# Patient Record
Sex: Female | Born: 1945 | Race: White | Hispanic: No | Marital: Married | Smoking: Never smoker
Health system: Southern US, Community
[De-identification: ages and names within clinical notes are randomized; demographics above are authoritative.]

## PROBLEM LIST (undated history)

## (undated) DIAGNOSIS — L570 Actinic keratosis: Secondary | ICD-10-CM

## (undated) DIAGNOSIS — M8580 Other specified disorders of bone density and structure, unspecified site: Secondary | ICD-10-CM

## (undated) DIAGNOSIS — N6009 Solitary cyst of unspecified breast: Secondary | ICD-10-CM

## (undated) DIAGNOSIS — I4891 Unspecified atrial fibrillation: Secondary | ICD-10-CM

## (undated) DIAGNOSIS — C801 Malignant (primary) neoplasm, unspecified: Secondary | ICD-10-CM

## (undated) DIAGNOSIS — C679 Malignant neoplasm of bladder, unspecified: Secondary | ICD-10-CM

## (undated) DIAGNOSIS — I340 Nonrheumatic mitral (valve) insufficiency: Secondary | ICD-10-CM

## (undated) DIAGNOSIS — I10 Essential (primary) hypertension: Secondary | ICD-10-CM

## (undated) DIAGNOSIS — D72819 Decreased white blood cell count, unspecified: Secondary | ICD-10-CM

## (undated) HISTORY — DX: Nonrheumatic mitral (valve) insufficiency: I34.0

## (undated) HISTORY — PX: JOINT REPLACEMENT: SHX530

## (undated) HISTORY — DX: Other specified disorders of bone density and structure, unspecified site: M85.80

## (undated) HISTORY — PX: MASTECTOMY: SHX3

## (undated) HISTORY — DX: Decreased white blood cell count, unspecified: D72.819

## (undated) HISTORY — PX: TOTAL HIP ARTHROPLASTY: SHX124

## (undated) HISTORY — DX: Malignant neoplasm of bladder, unspecified: C67.9

## (undated) HISTORY — DX: Actinic keratosis: L57.0

## (undated) HISTORY — DX: Solitary cyst of unspecified breast: N60.09

## (undated) HISTORY — DX: Malignant (primary) neoplasm, unspecified: C80.1

## (undated) HISTORY — DX: Unspecified atrial fibrillation: I48.91

## (undated) HISTORY — PX: SHOULDER SURGERY: SHX246

---
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
## 1995-03-23 HISTORY — PX: KNEE ARTHROSCOPY W/ MENISCECTOMY: SHX1879

## 2001-03-22 DIAGNOSIS — N6009 Solitary cyst of unspecified breast: Secondary | ICD-10-CM

## 2001-03-22 HISTORY — DX: Solitary cyst of unspecified breast: N60.09

## 2006-05-02 ENCOUNTER — Other Ambulatory Visit: Payer: Self-pay

## 2006-05-02 ENCOUNTER — Inpatient Hospital Stay: Payer: Self-pay | Admitting: Cardiology

## 2006-05-03 ENCOUNTER — Other Ambulatory Visit: Payer: Self-pay

## 2008-03-22 DIAGNOSIS — C801 Malignant (primary) neoplasm, unspecified: Secondary | ICD-10-CM

## 2008-03-22 DIAGNOSIS — I4891 Unspecified atrial fibrillation: Secondary | ICD-10-CM

## 2008-03-22 HISTORY — DX: Malignant (primary) neoplasm, unspecified: C80.1

## 2008-03-22 HISTORY — DX: Unspecified atrial fibrillation: I48.91

## 2008-10-27 LAB — HM MAMMOGRAPHY

## 2009-02-19 HISTORY — PX: MASTECTOMY: SHX3

## 2009-02-22 ENCOUNTER — Emergency Department: Payer: Self-pay | Admitting: Internal Medicine

## 2010-08-17 ENCOUNTER — Ambulatory Visit: Payer: Self-pay | Admitting: Internal Medicine

## 2010-11-09 ENCOUNTER — Encounter: Payer: Self-pay | Admitting: Internal Medicine

## 2010-11-09 ENCOUNTER — Ambulatory Visit (INDEPENDENT_AMBULATORY_CARE_PROVIDER_SITE_OTHER): Payer: BC Managed Care – PPO | Admitting: Internal Medicine

## 2010-11-09 VITALS — BP 126/82 | HR 74 | Temp 99.2°F | Resp 14 | Ht 66.5 in | Wt 195.8 lb

## 2010-11-09 DIAGNOSIS — H6690 Otitis media, unspecified, unspecified ear: Secondary | ICD-10-CM

## 2010-11-09 MED ORDER — AZITHROMYCIN 500 MG PO TABS: 500.0000 mg | ORAL_TABLET | Freq: Every day | ORAL | Status: DC

## 2010-11-09 MED ORDER — AZITHROMYCIN 500 MG PO TABS: ORAL_TABLET | ORAL | Status: AC

## 2010-11-09 MED ORDER — AZITHROMYCIN 500 MG PO TABS: ORAL_TABLET | ORAL | Status: DC

## 2010-11-09 NOTE — Progress Notes (Signed)
  Subjective:     Heidi Walsh is a 65 y.o. female who presents with ear pain and possible ear infection. Symptoms include: left ear pain. Onset of symptoms was 4 days ago, and have been unchanged since that time. Associated symptoms include: achiness, headache and low grade fever.  Patient denies: chills, productive cough and sinus pressure. She is drinking plenty of fluids.  The following portions of the patient's history were reviewed and updated as appropriate: allergies, current medications, past family history, past medical history, past social history, past surgical history and problem list.  Review of Systems A comprehensive review of systems was negative except for: Ears, nose, mouth, throat, and face: positive for earaches   Objective:    BP 126/82  Pulse 74  Temp(Src) 99.2 F (37.3 C) (Oral)  Resp 14  Ht 5' 6.5" (1.689 m)  Wt 195 lb 12 oz (88.792 kg)  BMI 31.12 kg/m2 General:  alert, cooperative, appears stated age and no distress  Right Ear: normal landmarks and mobility  Left Ear: inflamed  Mouth:  normal findings: lips normal without lesions  Neck: no adenopathy, no carotid bruit, no JVD, supple, symmetrical, trachea midline and thyroid not enlarged, symmetric, no tenderness/mass/nodules     Assessment:    Left acute otitis media   Plan:    Treatment: Azithromycin. OTC analgesia as needed. Fluids, rest, avoid carbonated/alcoholic and caffeinated beverages.  Follow up in 7 days if not improving.

## 2010-11-09 NOTE — Patient Instructions (Addendum)
Take Sudafed PE 10 mg every 6 hours for ear congestion  No underwater pool activity for one week,  Use a cotton ball in ear for your water exercisesMiddle Ear Infection, Adult (Otitis Media, Adult) A middle ear infection is an infection in the space behind the eardrum. The medical name for this is "otitis media." It may happen after a common cold. It is caused by a germ that starts growing in that space. You may feel swollen glands in your neck on the side of the ear infection. HOME CARE INSTRUCTIONS  Take your medicine as directed until it is gone, even if you feel better after the first few days.   Only take over-the-counter or prescription medicines for pain, discomfort, or fever as directed by your caregiver.   Occasional use of a nasal decongestant a couple times per day may help with discomfort and help the eustachian tube to drain better.  Follow up with your caregiver in 10 to 14 days or as directed, to be certain that the infection has cleared. Not keeping the appointment could result in a chronic or permanent injury, pain, hearing loss and disability. If there is any problem keeping the appointment, you must call back to this facility for assistance. SEEK IMMEDIATE MEDICAL CARE IF:  You are not getting better in 2 to 3 days.   You have pain that is not controlled with medication.   You feel worse instead of better.   You cannot use the medication as directed.   You develop swelling, redness or pain around the ear or stiffness in your neck.  MAKE SURE YOU:  Understand these instructions.   Will watch your condition.   Will get help right away if you are not doing well or get worse.  Document Released: 12/12/2003 Document Re-Released: 08/26/2009 [facility] Patient Information 2011 [place], [place].

## 2010-11-19 ENCOUNTER — Ambulatory Visit (INDEPENDENT_AMBULATORY_CARE_PROVIDER_SITE_OTHER): Payer: BC Managed Care – PPO | Admitting: Internal Medicine

## 2010-11-19 ENCOUNTER — Encounter: Payer: Self-pay | Admitting: Internal Medicine

## 2010-11-19 VITALS — BP 141/100 | HR 79 | Temp 97.6°F | Resp 16 | Ht 66.5 in | Wt 194.5 lb

## 2010-11-19 DIAGNOSIS — H6690 Otitis media, unspecified, unspecified ear: Secondary | ICD-10-CM

## 2010-11-19 MED ORDER — AZITHROMYCIN 500 MG PO TABS: ORAL_TABLET | ORAL | Status: AC

## 2010-11-19 NOTE — Assessment & Plan Note (Signed)
Symptoms havve improved but have not resolved.  Will resume sudafed PE and augmentin for one more week, and advised to stay out of water until resolved.  She has requested molds made for future use during water aerobics.

## 2010-11-19 NOTE — Progress Notes (Signed)
  Subjective:    Patient ID: Heidi Walsh, female    DOB: 1945/10/01, 65 y.o.   MRN: [id number]  HPI [name] is here to followup on otitis media treated last Monday.  She did have some improvement on Azithromycin but continues to have a  feeling of fluid in left ear along with periaurichular discomfort without drainage or fevers.  Has resumed flonase only recently but stopped sudafed PE.      Review of Systems  Constitutional: Negative for fever, chills and unexpected weight change.  HENT: Positive for ear pain. Negative for hearing loss, nosebleeds, congestion, sore throat, facial swelling, rhinorrhea, sneezing, mouth sores, trouble swallowing, neck pain, neck stiffness, voice change, postnasal drip, sinus pressure, tinnitus and ear discharge.   Eyes: Negative for pain, discharge, redness and visual disturbance.  Respiratory: Negative for cough, chest tightness, shortness of breath, wheezing and stridor.   Cardiovascular: Negative for chest pain, palpitations and leg swelling.  Musculoskeletal: Negative for myalgias and arthralgias.  Skin: Negative for color change and rash.  Neurological: Negative for dizziness, weakness, light-headedness and headaches.  Hematological: Negative for adenopathy.       Objective:   Physical Exam  Constitutional: She is oriented to person, place, and time. She appears well-developed and well-nourished.  HENT:  Right Ear: Tympanic membrane is injected.  Left Ear: Hearing normal. There is mastoid tenderness. Tympanic membrane is injected. A middle ear effusion is present.  Mouth/Throat: Oropharynx is clear and moist.  Eyes: EOM are normal. Pupils are equal, round, and reactive to light. No scleral icterus.  Neck: Normal range of motion. Neck supple. No JVD present. No thyromegaly present.  Cardiovascular: Normal rate, regular rhythm, normal heart sounds and intact distal pulses.   Pulmonary/Chest: Effort normal and breath sounds normal.  Abdominal: Soft.  Bowel sounds are normal. She exhibits no mass. There is no tenderness.  Musculoskeletal: Normal range of motion. She exhibits no edema.  Lymphadenopathy:    She has no cervical adenopathy.  Neurological: She is alert and oriented to person, place, and time.  Skin: Skin is warm and dry.  Psychiatric: She has a normal mood and affect.          Assessment & Plan:

## 2010-11-19 NOTE — Patient Instructions (Signed)
Resume azithromycin and sudafed PE for one more week.

## 2010-11-27 ENCOUNTER — Telehealth: Payer: Self-pay | Admitting: *Deleted

## 2010-11-27 DIAGNOSIS — H6690 Otitis media, unspecified, unspecified ear: Secondary | ICD-10-CM

## 2010-11-27 MED ORDER — LEVOFLOXACIN 500 MG PO TABS: 500.0000 mg | ORAL_TABLET | Freq: Every day | ORAL | Status: AC

## 2010-11-27 NOTE — Telephone Encounter (Signed)
rx for levaquin sent to pharmacy,  One tablet daily for 7 days

## 2010-11-27 NOTE — Telephone Encounter (Signed)
Pt finished zithromax today and is still having bilateral ear pain, feeling fatigued, no known fever..  Pt states she was told to call if no better for antibiotics.  Pt uses [facility].  Please advise.

## 2010-11-27 NOTE — Telephone Encounter (Signed)
RC to pt.  Pt states she is already taking Flonase and Sudafed PE.  She last saw Dr. Jenne Campus last fall, but she will call his office today for an appointment.  Pt would like RX for antibiotic in interim.  Pt was under impression a different antibiotic would be next step. Please advise.

## 2010-11-27 NOTE — Telephone Encounter (Signed)
Is she using any decongestants?  If not,  Add sudafed PE available OTC.  And I will need to refer to ENT for persistent ear pain

## 2010-12-14 ENCOUNTER — Other Ambulatory Visit: Payer: Self-pay | Admitting: Internal Medicine

## 2010-12-14 MED ORDER — BUPROPION HCL ER (SR) 200 MG PO TB12: 200.0000 mg | ORAL_TABLET | Freq: Two times a day (BID) | ORAL | Status: DC

## 2011-02-20 HISTORY — PX: ATRIAL ABLATION SURGERY: SHX560

## 2011-02-23 ENCOUNTER — Ambulatory Visit (INDEPENDENT_AMBULATORY_CARE_PROVIDER_SITE_OTHER): Payer: BC Managed Care – PPO | Admitting: Internal Medicine

## 2011-02-23 DIAGNOSIS — Z23 Encounter for immunization: Secondary | ICD-10-CM

## 2011-03-11 ENCOUNTER — Ambulatory Visit: Payer: Self-pay | Admitting: Cardiology

## 2011-06-23 DIAGNOSIS — M722 Plantar fascial fibromatosis: Secondary | ICD-10-CM | POA: Insufficient documentation

## 2011-10-18 DIAGNOSIS — G709 Myoneural disorder, unspecified: Secondary | ICD-10-CM | POA: Insufficient documentation

## 2011-11-17 ENCOUNTER — Encounter: Payer: Self-pay | Admitting: Internal Medicine

## 2011-11-17 ENCOUNTER — Ambulatory Visit (INDEPENDENT_AMBULATORY_CARE_PROVIDER_SITE_OTHER): Payer: BC Managed Care – PPO | Admitting: Internal Medicine

## 2011-11-17 VITALS — BP 112/70 | HR 76 | Temp 98.9°F | Resp 16 | Wt 204.5 lb

## 2011-11-17 DIAGNOSIS — G4730 Sleep apnea, unspecified: Secondary | ICD-10-CM

## 2011-11-17 DIAGNOSIS — C7989 Secondary malignant neoplasm of other specified sites: Secondary | ICD-10-CM | POA: Insufficient documentation

## 2011-11-17 DIAGNOSIS — E669 Obesity, unspecified: Secondary | ICD-10-CM

## 2011-11-17 DIAGNOSIS — M25569 Pain in unspecified knee: Secondary | ICD-10-CM

## 2011-11-17 DIAGNOSIS — I4891 Unspecified atrial fibrillation: Secondary | ICD-10-CM

## 2011-11-17 DIAGNOSIS — I480 Paroxysmal atrial fibrillation: Secondary | ICD-10-CM | POA: Insufficient documentation

## 2011-11-17 DIAGNOSIS — Z96652 Presence of left artificial knee joint: Secondary | ICD-10-CM | POA: Insufficient documentation

## 2011-11-17 DIAGNOSIS — Z853 Personal history of malignant neoplasm of breast: Secondary | ICD-10-CM

## 2011-11-17 NOTE — Patient Instructions (Addendum)
This is  Dr. Tullos's version of a  "Low GI"  Diet:  All of the foods can be found at grocery stores and in bulk at [employer].  The Atkins protein bars and shakes are available in more varieties at [employer], [employer] and [employer].     7 AM Breakfast:  Low carbohydrate Protein  Shakes (I recommend the EAS AdvantEdge "Carb Control" shakes  Or the low carb shakes by Atkins.   Both are available everywhere:  In  cases at [employer]Js  Or in 4 packs at grocery stores and pharmacies  2.5 carbs  (Alternative is  a toasted Arnold's Sandwhich Thin w/ peanut butter, a "Bagel Thin" with cream cheese and salmon) or  a scrambled egg burrito made with a low carb tortilla .  Avoid cereal and bananas, oatmeal too unless you are cooking the old fashioned kind that takes 30-40 minutes to prepare.  the rest is overly processed, has minimal fiber, and is loaded with carbohydrates!   10 AM: Protein bar by Atkins (the snack size, under 200 cal).  There are many varieties , available widely again or in bulk in limited varieties at [employer]Js)  Other so called "protein bars" tend to be loaded with carbohydrates.  Remember, in food advertising, the word "energy" is synonymous for " carbohydrate."  Lunch: sandwich of turkey, (or any lunchmeat, grilled meat or canned tuna), fresh avocado, mayonnaise  and cheese on a lower carbohydrate pita bread, flatbread, or tortilla . Ok to use regular mayonnaise. The bread is the only source or carbohydrate that can be decreased (Joseph's makes a pita bread and a flat bread that are 50 cal and 4 net carbs ; Toufayan makes a low carb flatbread that's 100 cal and 9 net carbs  and  Mission makes a low carb whole wheat tortilla  That is 210 cal and 6 net carbs)  3 PM:  Mid day :  Another protein bar,  Or a  cheese stick (100 cal, 0 carbs),  Or 1 ounce of  almonds, walnuts, pistachios, pecans, peanuts,  Macadamia nuts. Or a Dannon light n Fit greek yogurt, 80 cal 8 net carbs . Avoid "granola"; the dried cranberries  and raisins are loaded with carbohydrates. Mixed nuts ok if no raisins or cranberries or dried fruit.      6 PM  Dinner:  "mean and green:"  Meat/chicken/fish or a high protein legume; , with a green salad, and a low GI  Veggie (broccoli, cauliflower, green beans, spinach, brussel sprouts. Lima beans) : Avoid "Low fat dressings, as well as [place] and [place]! They are loaded with sugar! Instead use ranch, vinagrette,  Blue cheese, etc  9 PM snack : Breyer's "low carb" fudgsicle or  ice cream bar (Carb Smart line), or  Weight Watcher's ice cream bar , or another "no sugar added" ice cream;a serving of fresh berries/cherries with whipped cream (Avoid bananas, pineapple, grapes  and watermelon on a regular basis because they are high in sugar)   Remember that snack Substitutions should be less than 15 to 20 carbs  Per serving. Remember to subtract fiber grams and sugar alcohols to get the "net carbs."    

## 2011-11-17 NOTE — Progress Notes (Signed)
Patient ID: Heidi Walsh, female   DOB: 04-07-1945, 66 y.o.   MRN: [id number]  Patient Active Problem List  Diagnosis  . Obesity (BMI 30-39.9)  . Knee pain  . Sleep apnea  . Atrial fibrillation  . History of breast cancer  . breast cancer    Subjective:  CC:   Chief Complaint  Patient presents with  . Follow-up    HPI: Multiple complaints including weight gain and joint pain  Patient has been lost to follow up over the past year she had a recurrence of chest discomfort and chest tightness and saw her oncologist.  Workup was negative for recurrence of CA. She was referred to Dr.  Lady Gary who ordered overnight pulse ox   which strongly suggested sleep apnea with desaturations into the 70s. This was confirmed with a sleep study in late December showing severe apnea.   AHI was 68,  Sats dropped to low 70s .   she Has been wearing CPAP every night since Jan 8th.  she has had a 10 lb wt gain after 9 weeks of using CPAP despite exercising regularly at [employer] and swimming several times per week. She has had her CPAP recheck and is on the appropriate pressure.  additionally she had a brief admission for uncontrolled atrial fib/flutter became problematic.  Cardioversion was scheduled but deferred and her arrhythmia is currently managed with  high doses of flecainide.   she was eventually referred for ablation and Antony Madura at [facility] did a comprehensive ablation on July 28.   however she remains dependent on flecainide 150 mg bid, metoprolol 50  Mg .  she wasTold to modify her exercise For 3 months while  herheart was  remapping /recovering.   she has a Follow up today with [facility].   2)  her right knee continues to be a source of constant pain. She underwent a mask on him he in  1997,  but recently had a second opinion by an orthopedist at [facility] .   he opined that her leg pain was due to severe  bilateral valgus and leg length discrepancy and recommended straightening of her right leg and  leg lengthening along with knee replacements, right followed by left.  She has done some research and is requesting a referral to Dr. Piedad Climes at [facility].  she has brought copies of her sleep study as well as clinic notes from her evaluation by Shawn Stall PA-C at the [facility] department of orthopedics and sports medicine.    Heidi Sethi Robertsis a 66 y.o. female who presents   Past Medical History  Diagnosis Date  . Atrial fibrillation 2010    Followed by Dr. Mariel Kansky  . Mitral regurgitation     and prolapse  . Actinic keratosis     managed by Jimmy Footman  . Osteopenia 56    Stable by repeat scans  . Breast cyst 2003    aspirated, fibroystics breasts with stable calcifications on left breast  . Leukopenia   . breast cancer 2010    s/p mastectomy    Past Surgical History  Procedure Date  . Mastectomy 02/2009    Bilateral  . Cesarean section     x 2  . Shoulder surgery     left rotator cuff  . Knee arthroscopy w/ meniscectomy 1997  . Atrial ablation surgery Dec 2012    [facility]         The following portions  of the patient's history were reviewed and updated as appropriate: Allergies, current medications, and problem list.    Review of Systems:  A comprehensive ROS was done and positive for knee pain and weight gain.   The rest was negative.  History   Social History  . Marital Status: Married    Spouse Name: N/A    Number of Children: N/A  . Years of Education: N/A   Occupational History  . Not on file.   Social History Main Topics  . Smoking status: Never Smoker   . Smokeless tobacco: Never Used  . Alcohol Use: Yes     Moderate  . Drug Use: No  . Sexually Active: Not on file   Other Topics Concern  . Not on file   Social History Narrative  . No narrative on file    Objective:  BP 112/70  Pulse 76  Temp 98.9 F (37.2 C) (Oral)  Resp 16  Wt 204 lb 8 oz (92.761 kg)  SpO2 96%  General appearance: alert, obese,  cooperative and appears stated age Ears: normal TM's and external ear canals both ears Throat: lips, mucosa, and tongue normal; teeth and gums normal Neck: no adenopathy, no carotid bruit, supple, symmetrical, trachea midline and thyroid not enlarged, symmetric, no tenderness/mass/nodules Back: symmetric, no curvature. ROM normal. No CVA tenderness. Lungs: clear to auscultation bilaterally Heart: regular rate and rhythm, S1, S2 normal, no murmur, click, rub or gallop Abdomen: soft, non-tender; bowel sounds normal; no masses,  no organomegaly Pulses: 2+ and symmetric Skin: Skin color, texture, turgor normal. No rashes or lesions MSK:  Severe valgus noted.  No effusions.  Lymph nodes: Cervical, supraclavicular, and axillary nodes normal.  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 She underwent a steroid injection at the [facility] in July and is requesting referral to local orthopedist for discussion of multiple interventions including leg length modification and straightening followed by replacement.  Obesity (BMI 30-39.9) She is frustrated by her weight gain considering her consistency with exercising. She is limited somewhat by her knee pain. I've recommended a low glycemic index diet and have given her an example in printed form.  Sleep apnea Diagnosed in December with a sleep study. Patient had low O2 saturations of 73%. This is most likely aggravating her atrial fibrillation. She is wearing CPAP regularly. OSA was considered to with the total AHI of 38 events per hour with 66 events per hour recorded during supine sleep. She is wearing CPAP at 6 cm H20  Atrial fibrillation Status post ablation by AP physiologist at [facility]. She continues to use flecainide and metoprolol as directed. She will follow up with Dr. Ihor Austin today.   Updated Medication List Outpatient  Encounter Prescriptions as of 11/17/2011  Medication Sig Dispense Refill  . apixaban (ELIQUIS) 5 MG TABS tablet Take by mouth 2 (two) times daily.      Marland Kitchen b complex vitamins capsule Take 1 capsule by mouth daily.        . Calcium Carbonate-Vitamin D (CALCIUM-VITAMIN D) 500-200 MG-UNIT per tablet Take 1 tablet by mouth 2 (two) times daily with a meal.        . cetirizine (ZYRTEC) 10 MG tablet Take 10 mg by mouth daily.        . diphenhydramine-acetaminophen (TYLENOL PM) 25-500 MG TABS Take 1 tablet by mouth at bedtime as needed.      Marland Kitchen  Doxylamine Succinate, Sleep, (SLEEP AID PO) Take by mouth.      . flecainide (TAMBOCOR) 150 MG tablet Take 150 mg by mouth 2 (two) times daily.      . hydrochlorothiazide 25 MG tablet Take 25 mg by mouth daily.        Marland Kitchen HYDROcodone-acetaminophen (VICODIN) 5-500 MG per tablet Take 1 tablet by mouth every 6 (six) hours as needed.      Marland Kitchen letrozole (FEMARA) 2.5 MG tablet Take 2.5 mg by mouth daily.        . metoprolol (TOPROL-XL) 50 MG 24 hr tablet Take 50 mg by mouth daily.        . Multiple Vitamin (MULTIVITAMIN) tablet Take 1 tablet by mouth daily.        . Omega 3-6-9 Fatty Acids (OMEGA 3-6-9 COMPLEX PO) Take by mouth.      . DISCONTD: aspirin 325 MG tablet Take 325 mg by mouth daily.        Marland Kitchen DISCONTD: buPROPion (WELLBUTRIN SR) 200 MG 12 hr tablet Take 1 tablet (200 mg total) by mouth 2 (two) times daily.  60 tablet  6  . DISCONTD: GINKGO BILOBA PO Take 1 tablet by mouth daily.           Orders Placed This Encounter  Procedures  . Ambulatory referral to Orthopedic Surgery    No Follow-up on file.

## 2011-11-18 ENCOUNTER — Encounter: Payer: Self-pay | Admitting: Internal Medicine

## 2011-11-18 DIAGNOSIS — C801 Malignant (primary) neoplasm, unspecified: Secondary | ICD-10-CM | POA: Insufficient documentation

## 2011-11-18 NOTE — Assessment & Plan Note (Signed)
She is frustrated by her weight gain considering her consistency with exercising. She is limited somewhat by her knee pain. I've recommended a low glycemic index diet and have given her an example in printed form.

## 2011-11-18 NOTE — Assessment & Plan Note (Signed)
Status post ablation by AP physiologist at [facility]. She continues to use flecainide and metoprolol as directed. She will follow up with Dr. Ihor Austin today.

## 2011-11-18 NOTE — Assessment & Plan Note (Signed)
Diagnosed in December with a sleep study. Patient had low O2 saturations of 73%. This is most likely aggravating her atrial fibrillation. She is wearing CPAP regularly. OSA was considered to with the total AHI of 38 events per hour with 66 events per hour recorded during supine sleep. She is wearing CPAP at 6 cm H20

## 2011-11-18 NOTE — Assessment & Plan Note (Signed)
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 She underwent a steroid injection at the [facility] in July and is requesting referral to local orthopedist for discussion of multiple interventions including leg length modification and straightening followed by replacement.

## 2011-12-30 ENCOUNTER — Telehealth: Payer: Self-pay | Admitting: Internal Medicine
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
## 2011-12-30 NOTE — Telephone Encounter (Signed)
Spoke with patient via telephone, she stated that she didn't have any aches and pains until today, advised patient that she should have went to ED or urgent care last night.  We have no available appts here in the office tomorrow.  She stated that she will go to urgent care.

## 2011-12-31 ENCOUNTER — Ambulatory Visit: Payer: Self-pay | Admitting: Medical

## 2012-05-06 ENCOUNTER — Other Ambulatory Visit: Payer: Self-pay

## 2012-05-24 ENCOUNTER — Ambulatory Visit: Payer: Self-pay | Admitting: Family Medicine

## 2012-05-24 ENCOUNTER — Emergency Department: Payer: Self-pay | Admitting: Emergency Medicine

## 2012-05-24 LAB — URINALYSIS, COMPLETE
Bacteria: NONE SEEN
Bilirubin,UR: NEGATIVE
Glucose,UR: NEGATIVE mg/dL (ref 0–75)
Ketone: NEGATIVE
Leukocyte Esterase: NEGATIVE
Nitrite: NEGATIVE
Ph: 7 (ref 4.5–8.0)
Protein: NEGATIVE
Specific Gravity: 1.004 (ref 1.003–1.030)
Squamous Epithelial: <1
WBC UR: 1 /HPF (ref 0–5)

## 2012-05-24 LAB — COMPREHENSIVE METABOLIC PANEL
Albumin: 4 g/dL (ref 3.4–5.0)
Alkaline Phosphatase: 93 U/L (ref 50–136)
BUN: 10 mg/dL (ref 7–18)
Calcium, Total: 9.1 mg/dL (ref 8.5–10.1)
Co2: 28 mmol/L (ref 21–32)
Creatinine: 0.64 mg/dL (ref 0.60–1.30)
EGFR (African American): >60
EGFR (Non-African Amer.): >60
Glucose: 101 mg/dL — ABNORMAL HIGH (ref 65–99)
Osmolality: 269 (ref 275–301)
Potassium: 3.6 mmol/L (ref 3.5–5.1)
SGPT (ALT): 80 U/L — ABNORMAL HIGH (ref 12–78)

## 2012-05-24 LAB — CBC
HCT: 42.1 % (ref 35.0–47.0)
HGB: 14.1 g/dL (ref 12.0–16.0)
MCH: 31.6 pg (ref 26.0–34.0)
MCHC: 33.6 g/dL (ref 32.0–36.0)
Platelet: 233 10*3/uL (ref 150–440)
RDW: 13.4 % (ref 11.5–14.5)
WBC: 6.1 10*3/uL (ref 3.6–11.0)

## 2012-05-26 ENCOUNTER — Ambulatory Visit: Payer: BC Managed Care – PPO | Admitting: Internal Medicine

## 2012-05-27 ENCOUNTER — Telehealth: Payer: Self-pay | Admitting: Family Medicine

## 2012-05-27 MED ORDER — DICYCLOMINE HCL 10 MG PO CAPS: ORAL_CAPSULE | ORAL | Status: DC

## 2012-05-27 MED ORDER — SULFAMETHOXAZOLE-TMP DS 800-160 MG PO TABS: 1.0000 | ORAL_TABLET | Freq: Two times a day (BID) | ORAL | Status: DC
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
## 2012-05-30 ENCOUNTER — Ambulatory Visit (INDEPENDENT_AMBULATORY_CARE_PROVIDER_SITE_OTHER): Payer: BC Managed Care – PPO | Admitting: Internal Medicine

## 2012-05-30 ENCOUNTER — Encounter: Payer: Self-pay | Admitting: Internal Medicine

## 2012-05-30 VITALS — BP 130/88 | HR 85 | Temp 98.3°F | Resp 16 | Wt 194.0 lb

## 2012-05-30 DIAGNOSIS — A09 Infectious gastroenteritis and colitis, unspecified: Secondary | ICD-10-CM

## 2012-05-30 LAB — BASIC METABOLIC PANEL
BUN: 12 mg/dL (ref 6–23)
CO2: 28 mEq/L (ref 19–32)
Calcium: 9.7 mg/dL (ref 8.4–10.5)
GFR: 73.41 mL/min (ref >60.00)
Glucose, Bld: 99 mg/dL (ref 70–99)
Potassium: 3.9 mEq/L (ref 3.5–5.1)

## 2012-05-30 NOTE — Progress Notes (Signed)
Patient ID: Heidi Walsh, female   DOB: 1945/12/26, 67 y.o.   MRN: [id number]   Patient Active Problem List  Diagnosis  . Obesity (BMI 30-39.9)  . Knee pain  . Sleep apnea  . Atrial fibrillation  . History of breast cancer  . breast cancer  . Traveler's diarrhea    Subjective:  CC:   Chief Complaint  Patient presents with  . Diarrhea    ER on Wednesday for Traveler's Diarrhea    HPI:   Heidi Balint Robertsis a 67 y.o. female who presents  For Follow up on travelers diarrhea,  She spent 5 days in [place] of [place] with fellow cancer survivors.  She returned with left hip pain secondary to bursitis from prolonged walking and diarrhea which started the day after she came home.  She had one low grade fever of 100.5 followed by cramping and lots of loose stools,  And after 48 hours of profuse diarrhea on  Wednesday was sent to ER by Urgent care for management of dehydration. Given  IV fluids x 2 liters,  And rx for  Sulfa, which she started on Wednesday, but ran out on Saturday bc office was closed so the on call MD refilled the sulfa ,  Lomotil and bentyl. On Sunday diarrhea resolved and she had a solid stool.  She is taking a probiotic daily.  She ate a salad yesterday without a problem.  She is having eyelid surgery tomorrow for correction of bilateral ptosis.     Past Medical History  Diagnosis Date  . Atrial fibrillation 2010    Followed by Dr. Mariel Kansky  . Mitral regurgitation     and prolapse  . Actinic keratosis     managed by Jimmy Footman  . Osteopenia 56    Stable by repeat scans  . Breast cyst 2003    aspirated, fibroystics breasts with stable calcifications on left breast  . Leukopenia   . breast cancer 2010    s/p mastectomy    Past Surgical History  Procedure Laterality Date  . Mastectomy  02/2009    Bilateral  . Cesarean section      x 2  . Shoulder surgery      left rotator cuff  . Knee arthroscopy w/ meniscectomy  1997  . Atrial ablation surgery  Dec 2012     [facility]       The following portions of the patient's history were reviewed and updated as appropriate: Allergies, current medications, and problem list.    Review of Systems:   Patient denies headache, fevers, malaise, unintentional weight loss, skin rash, eye pain, sinus congestion and sinus pain, sore throat, dysphagia,  hemoptysis , cough, dyspnea, wheezing, chest pain, palpitations, orthopnea, edema, abdominal pain, nausea, melena, diarrhea, constipation, flank pain, dysuria, hematuria, urinary  Frequency, nocturia, numbness, tingling, seizures,  Focal weakness, Loss of consciousness,  Tremor, insomnia, depression, anxiety, and suicidal ideation.      History   Social History  . Marital Status: Married    Spouse Name: N/A    Number of Children: N/A  . Years of Education: N/A   Occupational History  . Not on file.   Social History Main Topics  . Smoking status: Never Smoker   . Smokeless tobacco: Never Used  . Alcohol Use: Yes     Comment: Moderate  . Drug Use: No  . Sexually Active: Not on file   Other Topics Concern  . Not on file   Social  History Narrative  . No narrative on file    Objective:  BP 130/88  Pulse 85  Temp(Src) 98.3 F (36.8 C) (Oral)  Resp 16  Wt 194 lb (87.998 kg)  BMI 30.85 kg/m2  SpO2 97%  General appearance: alert, cooperative and appears stated age Ears: normal TM's and external ear canals both ears Throat: lips, mucosa, and tongue normal; teeth and gums normal Neck: no adenopathy, no carotid bruit, supple, symmetrical, trachea midline and thyroid not enlarged, symmetric, no tenderness/mass/nodules Back: symmetric, no curvature. ROM normal. No CVA tenderness. Lungs: clear to auscultation bilaterally Heart: regular rate and rhythm, S1, S2 normal, no murmur, click, rub or gallop Abdomen: soft, non-tender; bowel sounds normal; no masses,  no organomegaly Pulses: 2+ and symmetric Skin: Skin color, texture, turgor normal. No rashes  or lesions Lymph nodes: Cervical, supraclavicular, and axillary nodes normal.  Assessment and Plan:  Traveler's diarrhea Now resolved; advised to finish 7 days of sulfa. Checking electrolytes and mg today.    Updated Medication List Outpatient Encounter Prescriptions as of 05/30/2012  Medication Sig Dispense Refill  . b complex vitamins capsule Take 1 capsule by mouth daily.        . Calcium Carbonate-Vitamin D (CALCIUM-VITAMIN D) 500-200 MG-UNIT per tablet Take 1 tablet by mouth 2 (two) times daily with a meal.        . cetirizine (ZYRTEC) 10 MG tablet Take 10 mg by mouth daily.        . diphenhydramine-acetaminophen (TYLENOL PM) 25-500 MG TABS Take 1 tablet by mouth at bedtime as needed.      . Doxylamine Succinate, Sleep, (SLEEP AID PO) Take by mouth.      . hydrochlorothiazide 25 MG tablet Take 25 mg by mouth daily.        Marland Kitchen letrozole (FEMARA) 2.5 MG tablet Take 2.5 mg by mouth daily.        . metoprolol (TOPROL-XL) 50 MG 24 hr tablet Take 25 mg by mouth daily.       . Multiple Vitamin (MULTIVITAMIN) tablet Take 1 tablet by mouth daily.        . Omega 3-6-9 Fatty Acids (OMEGA 3-6-9 COMPLEX PO) Take by mouth.      Marland Kitchen apixaban (ELIQUIS) 5 MG TABS tablet Take by mouth 2 (two) times daily.      Marland Kitchen dicyclomine (BENTYL) 10 MG capsule 1-2 tabs po q6h prn abdominal cramping  40 capsule  0  . flecainide (TAMBOCOR) 150 MG tablet Take 150 mg by mouth 2 (two) times daily.      Marland Kitchen HYDROcodone-acetaminophen (VICODIN) 5-500 MG per tablet Take 1 tablet by mouth every 6 (six) hours as needed.      . sulfamethoxazole-trimethoprim (BACTRIM DS) 800-160 MG per tablet Take 1 tablet by mouth 2 (two) times daily.  10 tablet  0   No facility-administered encounter medications on file as of 05/30/2012.     Orders Placed This Encounter  Procedures  . Magnesium  . Basic metabolic panel    No Follow-up on file.

## 2012-05-30 NOTE — Assessment & Plan Note (Addendum)
Now resolved; advised to finish 7 days of sulfa. Checking electrolytes and mg today; all wer normal..  Can proceed with surgery tomorrow,

## 2012-05-30 NOTE — Patient Instructions (Addendum)
Take 2 more days of Septra.  Save the last 2 for future (used for UTI's)  Dicyclomine is an anti spasmodic , for bowel cramps  diphen-atropine is generic Lomotil  (for diarrhea, controlled substance)   You can still have your eye surgery  Keep an electrolyte replacement drink (gatorade) on hand for future GI illnesses so your bowels can rest but still receive sugar and electrolytes as needed   Avoid dairy and roughage until bowels are back to normal

## 2012-05-31 ENCOUNTER — Encounter: Payer: Self-pay | Admitting: Internal Medicine

## 2012-06-12 ENCOUNTER — Encounter: Payer: Self-pay | Admitting: Internal Medicine

## 2012-10-27 LAB — HM COLONOSCOPY: HM Colonoscopy: NORMAL

## 2012-12-25 ENCOUNTER — Encounter: Payer: Self-pay | Admitting: Internal Medicine

## 2012-12-25 ENCOUNTER — Ambulatory Visit (INDEPENDENT_AMBULATORY_CARE_PROVIDER_SITE_OTHER): Payer: Medicare Other | Admitting: Internal Medicine

## 2012-12-25 VITALS — BP 124/70 | HR 89 | Temp 98.6°F | Resp 14 | Ht 66.5 in | Wt 209.5 lb

## 2012-12-25 DIAGNOSIS — E785 Hyperlipidemia, unspecified: Secondary | ICD-10-CM

## 2012-12-25 DIAGNOSIS — G4730 Sleep apnea, unspecified: Secondary | ICD-10-CM

## 2012-12-25 DIAGNOSIS — C801 Malignant (primary) neoplasm, unspecified: Secondary | ICD-10-CM

## 2012-12-25 DIAGNOSIS — E559 Vitamin D deficiency, unspecified: Secondary | ICD-10-CM

## 2012-12-25 DIAGNOSIS — R5381 Other malaise: Secondary | ICD-10-CM

## 2012-12-25 DIAGNOSIS — M25562 Pain in left knee: Secondary | ICD-10-CM

## 2012-12-25 DIAGNOSIS — Z23 Encounter for immunization: Secondary | ICD-10-CM

## 2012-12-25 DIAGNOSIS — E669 Obesity, unspecified: Secondary | ICD-10-CM

## 2012-12-25 DIAGNOSIS — M25569 Pain in unspecified knee: Secondary | ICD-10-CM

## 2012-12-25 MED ORDER — TETANUS-DIPHTH-ACELL PERTUSSIS 5-2.5-18.5 LF-MCG/0.5 IM SUSP: 0.5000 mL | Freq: Once | INTRAMUSCULAR | Status: DC

## 2012-12-25 NOTE — Patient Instructions (Addendum)
Return for fasting labs and A1c (amke appt)  Your  need a tetanus-diptheria-pertussis vaccine (TDaP) but you can get it for less $$$ at a local pharmacy with the script I have provided you.   Your goal is a weight loss of 33 lbs to get your BMI  To 28   This is  One version of a  "Low GI"  Diet:  It will still lower your blood sugars and allow you to lose 4 to 8  lbs  per month if you follow it carefully.  Your goal with exercise is a minimum of 30 minutes of aerobic exercise 5 days per week (Walking does not count once it becomes easy!)    All of the foods can be found at grocery stores and in bulk at [employer].  The Atkins protein bars and shakes are available in more varieties at Target, WalMart and [employer].     7 AM Breakfast:  Choose from the following:  Low carbohydrate Protein  Shakes (I recommend the EAS AdvantEdge "Carb Control" shakes  Or the low carb shakes by Atkins.    2.5 carbs   Arnold's "Sandwhich Thin"toasted  w/ peanut butter (no jelly: about 20 net carbs  "Bagel Thin" with cream cheese and salmon: about 20 carbs   a scrambled egg/bacon/cheese burrito made with Mission's "carb balance" whole wheat tortilla  (about 10 net carbs )   Avoid cereal and bananas, oatmeal and cream of wheat and grits. They are loaded with carbohydrates!   10 AM: high protein snack  Protein bar by Atkins (the snack size, under 200 cal, usually < 6 net carbs).    A stick of cheese:  Around 1 carb,  100 cal     Dannon Light n Fit [place] Yogurt  (80 cal, 8 carbs)  Other so called "protein bars" and Greek yogurts tend to be loaded with carbohydrates.  Remember, in food advertising, the word "energy" is synonymous for " carbohydrate."  Lunch:   A Sandwich using the bread choices listed, Can use any  Eggs,  lunchmeat, grilled meat or canned tuna), avocado, regular mayo/mustard  and cheese.  A Salad using blue cheese, ranch,  Goddess or vinagrette,  No croutons or "confetti" and no "candied nuts"  but regular nuts OK.   No pretzels or chips.  Pickles and miniature sweet peppers are a good low carb alternative that provide a "crunch"  The bread is the only source of carbohydrate in a sandwich and  can be decreased by trying some of these alternatives to traditional loaf bread  Joseph's makes a pita bread and a flat bread that are 50 cal and 4 net carbs available at BJs and WalMart.  This can be toasted to use with hummous as well  Toufayan makes a low carb flatbread that's 100 cal and 9 net carbs available at [employer] and [employer] makes 2 sizes of  Low carb whole wheat tortilla  (The large one is 210 cal and 6 net carbs) Avoid "Low fat dressings, as well as Reyne Dumas and [address] dressings They are loaded with sugar!   3 PM/ Mid day  Snack:  Consider  1 ounce of  almonds, walnuts, pistachios, pecans, peanuts,  Macadamia nuts or a nut medley.  Avoid "granola"; the dried cranberries and raisins are loaded with carbohydrates. Mixed nuts as long as there are no raisins,  cranberries or dried fruit.     6 PM  Dinner:  Meat/fowl/fish with a green salad, and either broccoli, cauliflower, green beans, spinach, brussel sprouts or  Lima beans. DO NOT BREAD THE PROTEIN!!      There is a low carb pasta by Dreamfield's that is acceptable and tastes great: only 5 digestible carbs/serving.( All grocery stores but BJs carry it )  Try Hurley Cisco Angelo's chicken piccata or chicken or eggplant parm over low carb pasta.(Lowes and BJs)   Marjory Lies Sanchez's "Carnitas" (pulled pork, no sauce,  0 carbs) or his beef pot roast to make a dinner burrito (at BJ's)  Pesto over low carb pasta (bj's sells a good quality pesto in the center refrigerated section of the deli   Whole wheat pasta is still full of digestible carbs and  Not as low in glycemic index as Dreamfield's.   Brown rice is still rice,  So skip the rice and noodles if you eat [place] or [place] (or at least limit to 1/2 cup)  9 PM snack :    Breyer's "low carb" fudgsicle or  ice cream bar (Carb Smart line), or  Weight Watcher's ice cream bar , or another "no sugar added" ice cream;  a serving of fresh berries/cherries with whipped cream   Cheese or DANNON'S LlGHT [employer] FIT [place] YOGURT  Avoid bananas, pineapple, grapes  and watermelon on a regular basis because they are high in sugar.  THINK OF THEM AS DESSERT  Remember that snack Substitutions should be less than 10 NET carbs per serving and meals < 20 carbs. Remember to subtract fiber grams to get the "net carbs."

## 2012-12-25 NOTE — Assessment & Plan Note (Signed)
She has had no recurrence in 4 yrs and has stopped Femara after 4 years due to miserable side effects.    

## 2012-12-25 NOTE — Assessment & Plan Note (Addendum)
Reviewed her diet which is healthy, and her activity which is impressive post hip replacement.  Suggested limiting her catrohydrate intake. Low GI diet reviewed.

## 2012-12-25 NOTE — Progress Notes (Signed)
Patient ID: Heidi Walsh, female   DOB: May 07, 1945, 67 y.o.   MRN: [id number]  Patient Active Problem List   Diagnosis Date Noted  . breast cancer   . Obesity (BMI 30-39.9) 11/17/2011  . Knee pain 11/17/2011  . Sleep apnea 11/17/2011  . Atrial fibrillation 11/17/2011  . History of breast cancer 11/17/2011    Subjective:  CC:   Chief Complaint  Patient presents with  . Follow-up    HPI:   Heidi Shadduck Robertsis a 67 y.o. female who presents  Follow up on multiple chronic medical issues including OSA, DJD, and breast CA.  1) OSA   Diagnosed in in Dec 2012 with sleep study due to recurrent episodes of uncontrolled atrial fib occurring at night due to hypoxia,. Has been using CPAP in Jan 2013,  Has been using it every night even with extensive  Travel.  Her AHI is < 1 based on the information readout  Every morning.  Using the nasal pillows.   2) DJD left hip s/p TKR.  Several months ago she developed progressive pain in her left hip which became severe, requiring use of vicodin and cane. Her orthopedist suggested hip be replaced prior to her knee surgery, which she accepted.  She underwent hip replacement and was walking within several hours .  She was pain free after 6  days.  She has no plans to rush into knee surgery until she is symptomatic again.   3) Atrial fib:  Very well controlled since the ablation and addressing the OSA    4) BRCA::  Stopping Femara after 4 years of misery .   Tamoxifen trial was no better.  Stopped the Femara one week ago    Exercises twice daily .  Has eliminated meat and certain fishes.   Eating a lot of whole rains and beans.   Mindy  [phone]  [facility] needs continuation notes     Past Medical History  Diagnosis Date  . Atrial fibrillation 2010    Followed by Dr. Mariel Kansky  . Mitral regurgitation     and prolapse  . Actinic keratosis     managed by Jimmy Footman  . Osteopenia 56    Stable by repeat scans  . Breast cyst 2003    aspirated,  fibroystics breasts with stable calcifications on left breast  . Leukopenia   . breast cancer 2010    s/p mastectomy    Past Surgical History  Procedure Laterality Date  . Mastectomy  02/2009    Bilateral  . Cesarean section      x 2  . Shoulder surgery      left rotator cuff  . Knee arthroscopy w/ meniscectomy  1997  . Atrial ablation surgery  Dec 2012    [facility]       The following portions of the patient's history were reviewed and updated as appropriate: Allergies, current medications, and problem list.    Review of Systems:   12 Pt  review of systems was negative except those addressed in the HPI,     History   Social History  . Marital Status: Married    Spouse Name: N/A    Number of Children: N/A  . Years of Education: N/A   Occupational History  . Not on file.   Social History Main Topics  . Smoking status: Never Smoker   . Smokeless tobacco: Never Used  . Alcohol Use: Yes     Comment: Moderate  .  Drug Use: No  . Sexual Activity: Not on file   Other Topics Concern  . Not on file   Social History Narrative  . No narrative on file    Objective:  Filed Vitals:   12/25/12 0826  BP: 124/70  Pulse: 89  Temp: 98.6 F (37 C)  Resp: 14     General appearance: alert, cooperative and appears stated age Ears: normal TM's and external ear canals both ears Throat: lips, mucosa, and tongue normal; teeth and gums normal Neck: no adenopathy, no carotid bruit, supple, symmetrical, trachea midline and thyroid not enlarged, symmetric, no tenderness/mass/nodules Back: symmetric, no curvature. ROM normal. No CVA tenderness. Lungs: clear to auscultation bilaterally Heart: regular rate and rhythm, S1, S2 normal, no murmur, click, rub or gallop Abdomen: soft, non-tender; bowel sounds normal; no masses,  no organomegaly Pulses: 2+ and symmetric Skin: Skin color, texture, turgor normal. No rashes or lesions Lymph nodes: Cervical, supraclavicular, and  axillary nodes normal.  Assessment and Plan:  Obesity (BMI 30-39.9) Reviewed her diet which is healthy, and her activity which is impressive post hip replacement.  Suggested limiting her catrohydrate intake. Low GI diet reviewed.   Knee pain Secondary to DJD . However, her symptoms are tolerable currently. Continue supportive care.   Sleep apnea Diagnosed in December with a sleep study. Patient had low O2 saturations of 73%. This is most likely aggravating her atrial fibrillation. She is wearing CPAP regularly. OSA was considered to severe with the total AHI of 38 events per hour with 66 events per hour recorded during supine sleep. She is wearing CPAP at 6 cm H20 and her  AHI has been reduced to 1/hr    breast cancer Sh ehas had no recurrence in 4 yrs and has stopped Femara after 4 years due to miserable side effects.    Updated Medication List Outpatient Encounter Prescriptions as of 12/25/2012  Medication Sig Dispense Refill  . aspirin 81 MG tablet Take 81 mg by mouth 2 (two) times daily.      Marland Kitchen b complex vitamins capsule Take 1 capsule by mouth daily.        . Calcium Carbonate-Vitamin D (CALCIUM-VITAMIN D) 500-200 MG-UNIT per tablet Take 1 tablet by mouth 2 (two) times daily with a meal.        . cetirizine (ZYRTEC) 10 MG tablet Take 10 mg by mouth daily.        . diphenhydramine-acetaminophen (TYLENOL PM) 25-500 MG TABS Take 1 tablet by mouth at bedtime as needed.      . Doxylamine Succinate, Sleep, (SLEEP AID PO) Take by mouth.      . hydrochlorothiazide 25 MG tablet Take 25 mg by mouth daily.        . metoprolol (TOPROL-XL) 50 MG 24 hr tablet Take 25 mg by mouth daily.       . Multiple Vitamin (MULTIVITAMIN) tablet Take 1 tablet by mouth daily.        . Omega 3-6-9 Fatty Acids (OMEGA 3-6-9 COMPLEX PO) Take by mouth.      Marland Kitchen apixaban (ELIQUIS) 5 MG TABS tablet Take by mouth 2 (two) times daily.      Marland Kitchen dicyclomine (BENTYL) 10 MG capsule 1-2 tabs po q6h prn abdominal cramping  40  capsule  0  . flecainide (TAMBOCOR) 150 MG tablet Take 150 mg by mouth 2 (two) times daily.      Marland Kitchen HYDROcodone-acetaminophen (VICODIN) 5-500 MG per tablet Take 1 tablet by mouth every 6 (six) hours  as needed.      Marland Kitchen letrozole (FEMARA) 2.5 MG tablet Take 2.5 mg by mouth daily.        Marland Kitchen sulfamethoxazole-trimethoprim (BACTRIM DS) 800-160 MG per tablet Take 1 tablet by mouth 2 (two) times daily.  10 tablet  0  . TDaP (BOOSTRIX) 5-2.5-18.5 LF-MCG/0.5 injection Inject 0.5 mLs into the muscle once.  0.5 mL  0   No facility-administered encounter medications on file as of 12/25/2012.     Orders Placed This Encounter  Procedures  . Flu Vaccine QUAD 36+ mos PF IM (Fluarix)  . Lipid panel  . Hemoglobin A1c  . CBC with Differential  . Comprehensive metabolic panel  . Vit D  25 hydroxy (rtn osteoporosis monitoring)  . TSH    No Follow-up on file.

## 2012-12-25 NOTE — Assessment & Plan Note (Addendum)
Secondary to DJD . However, her symptoms are tolerable currently. Continue supportive care.

## 2012-12-25 NOTE — Assessment & Plan Note (Signed)
Diagnosed in December with a sleep study. Patient had low O2 saturations of 73%. This is most likely aggravating her atrial fibrillation. She is wearing CPAP regularly. OSA was considered to severe with the total AHI of 38 events per hour with 66 events per hour recorded during supine sleep. She is wearing CPAP at 6 cm H20 and her  AHI has been reduced to 1/hr

## 2013-01-02 ENCOUNTER — Other Ambulatory Visit: Payer: Medicare Other

## 2013-01-04 ENCOUNTER — Other Ambulatory Visit: Payer: Medicare Other

## 2013-01-11 ENCOUNTER — Other Ambulatory Visit (INDEPENDENT_AMBULATORY_CARE_PROVIDER_SITE_OTHER): Payer: Medicare Other

## 2013-01-11 DIAGNOSIS — E785 Hyperlipidemia, unspecified: Secondary | ICD-10-CM

## 2013-01-11 DIAGNOSIS — E669 Obesity, unspecified: Secondary | ICD-10-CM

## 2013-01-11 DIAGNOSIS — E559 Vitamin D deficiency, unspecified: Secondary | ICD-10-CM

## 2013-01-11 DIAGNOSIS — R7989 Other specified abnormal findings of blood chemistry: Secondary | ICD-10-CM

## 2013-01-11 DIAGNOSIS — R5381 Other malaise: Secondary | ICD-10-CM

## 2013-01-11 LAB — CBC WITH DIFFERENTIAL/PLATELET
Basophils Absolute: 0 10*3/uL (ref 0.0–0.1)
Basophils Relative: 0.7 % (ref 0.0–3.0)
Eosinophils Absolute: 0.2 10*3/uL (ref 0.0–0.7)
Eosinophils Relative: 7.1 % — ABNORMAL HIGH (ref 0.0–5.0)
HCT: 41.1 % (ref 36.0–46.0)
Lymphs Abs: 1.2 10*3/uL (ref 0.7–4.0)
MCHC: 34.2 g/dL (ref 30.0–36.0)
MCV: 96 fl (ref 78.0–100.0)
Monocytes Absolute: 0.3 10*3/uL (ref 0.1–1.0)
Monocytes Relative: 11.2 % (ref 3.0–12.0)
Neutro Abs: 1.3 10*3/uL — ABNORMAL LOW (ref 1.4–7.7)
Platelets: 209 10*3/uL (ref 150.0–400.0)
RDW: 14.3 % (ref 11.5–14.6)

## 2013-01-11 LAB — COMPREHENSIVE METABOLIC PANEL
ALT: 44 U/L — ABNORMAL HIGH (ref 0–35)
AST: 34 U/L (ref 0–37)
Albumin: 4.4 g/dL (ref 3.5–5.2)
Alkaline Phosphatase: 68 U/L (ref 39–117)
Creatinine, Ser: 0.7 mg/dL (ref 0.4–1.2)
Glucose, Bld: 99 mg/dL (ref 70–99)
Sodium: 140 mEq/L (ref 135–145)
Total Bilirubin: 0.6 mg/dL (ref 0.3–1.2)
Total Protein: 7.5 g/dL (ref 6.0–8.3)

## 2013-01-11 LAB — LIPID PANEL
Cholesterol: 212 mg/dL — ABNORMAL HIGH (ref 0–200)
HDL: 59.3 mg/dL (ref >39.00)
Total CHOL/HDL Ratio: 4
Triglycerides: 94 mg/dL (ref 0.0–149.0)

## 2013-01-11 LAB — HEMOGLOBIN A1C: Hgb A1c MFr Bld: 5.9 % (ref 4.6–6.5)

## 2013-01-11 LAB — LDL CHOLESTEROL, DIRECT: Direct LDL: 146.4 mg/dL

## 2013-01-13 ENCOUNTER — Encounter: Payer: Self-pay | Admitting: Internal Medicine

## 2013-01-13 NOTE — Addendum Note (Signed)
Addended by: Sherlene Shams on: 01/13/2013 12:03 PM   Modules accepted: Orders

## 2013-02-13 ENCOUNTER — Other Ambulatory Visit (INDEPENDENT_AMBULATORY_CARE_PROVIDER_SITE_OTHER): Payer: Medicare Other

## 2013-02-13 ENCOUNTER — Encounter: Payer: Self-pay | Admitting: Internal Medicine

## 2013-02-13 DIAGNOSIS — R7989 Other specified abnormal findings of blood chemistry: Secondary | ICD-10-CM

## 2013-02-13 LAB — HEPATIC FUNCTION PANEL
ALT: 49 U/L — ABNORMAL HIGH (ref 0–35)
AST: 37 U/L (ref 0–37)
Alkaline Phosphatase: 62 U/L (ref 39–117)
Bilirubin, Direct: 0.1 mg/dL (ref 0.0–0.3)
Total Bilirubin: 0.8 mg/dL (ref 0.3–1.2)

## 2013-02-13 NOTE — Addendum Note (Signed)
Addended by: Sherlene Shams on: 02/13/2013 02:43 PM   Modules accepted: Orders

## 2013-02-21 ENCOUNTER — Telehealth: Payer: Self-pay | Admitting: Internal Medicine

## 2013-02-21 NOTE — Telephone Encounter (Signed)
Pt has appt 12/16 with oncology, Dr. Avis Epley.  Pt has scheduled to come in for nonfasting labs 12/5.  Would like Dr. Darrick Huntsman to check Dr. Avis Epley' notes to see if any additional labs should be ordered so that she does not have to get stuck twice.  Pt has made f/u appt to discuss labs with Dr. Darrick Huntsman on 12/11.    States on Mychart msg Tetanus and pneumococcal needs to be updated.  Pt states pneumococcal was received a few years ago.  Mychart msg also says she need mammogram.  Pt states she does not have breasts due to cancer and is asking if we can remove that.

## 2013-02-21 NOTE — Telephone Encounter (Signed)
Please advise of additional labs needed.

## 2013-02-21 NOTE — Telephone Encounter (Signed)
I do not have Dr Avis Epley notes.,  Ask patient to call his office and find out what labs Dr Avis Epley wants.,

## 2013-02-21 NOTE — Telephone Encounter (Signed)
Left message, requesting pt to call Dr. Avis Epley office and to call back with labs he has ordered.

## 2013-02-22 ENCOUNTER — Ambulatory Visit: Payer: Self-pay | Admitting: Internal Medicine

## 2013-02-22 ENCOUNTER — Telehealth: Payer: Self-pay | Admitting: Internal Medicine

## 2013-02-22 DIAGNOSIS — K839 Disease of biliary tract, unspecified: Secondary | ICD-10-CM

## 2013-02-22 DIAGNOSIS — R748 Abnormal levels of other serum enzymes: Secondary | ICD-10-CM | POA: Insufficient documentation

## 2013-02-22 NOTE — Telephone Encounter (Signed)
Her ultrasound noted a dilated common bile duct but no gallstones ,  And the appearance of  fatty liver. ,  Additional imaging is recommended to evaluate why the common bile duct is dilated.  I will order there MRCP (MRI of the abdomen that looks specifically at this area)

## 2013-02-22 NOTE — Telephone Encounter (Signed)
Left message for patient to return call to office on voicemail.

## 2013-02-23 ENCOUNTER — Other Ambulatory Visit (INDEPENDENT_AMBULATORY_CARE_PROVIDER_SITE_OTHER): Payer: Medicare Other

## 2013-02-23 DIAGNOSIS — R7989 Other specified abnormal findings of blood chemistry: Secondary | ICD-10-CM

## 2013-02-23 LAB — IRON AND TIBC
%SAT: 28 % (ref 20–55)
Iron: 101 ug/dL (ref 42–145)
TIBC: 356 ug/dL (ref 250–470)

## 2013-02-23 LAB — FERRITIN: Ferritin: 38.3 ng/mL (ref 10.0–291.0)

## 2013-02-24 LAB — HEPATITIS B SURFACE ANTIGEN: Hepatitis B Surface Ag: NEGATIVE

## 2013-02-24 LAB — HEPATITIS B CORE ANTIBODY, TOTAL: Hep B Core Total Ab: NONREACTIVE

## 2013-02-24 LAB — HEPATITIS B SURFACE ANTIBODY,QUALITATIVE: Hep B S Ab: POSITIVE — AB

## 2013-02-27 ENCOUNTER — Telehealth: Payer: Self-pay | Admitting: Internal Medicine

## 2013-02-27 DIAGNOSIS — Z9013 Acquired absence of bilateral breasts and nipples: Secondary | ICD-10-CM | POA: Insufficient documentation

## 2013-02-27 DIAGNOSIS — R748 Abnormal levels of other serum enzymes: Secondary | ICD-10-CM

## 2013-02-27 NOTE — Telephone Encounter (Signed)
Patient has not returned call. Tried calling patient family member answered phone  Left message to call office.

## 2013-02-27 NOTE — Assessment & Plan Note (Signed)
Her ultrasound noted a dilated common bile duct but no gallstones ,  And the appearance of  fatty liver. ,  Additional imaging is recommended to evaluate why the common bile duct is dilated.  I will order the MRCP

## 2013-02-28 NOTE — Telephone Encounter (Signed)
Proceed with MRCP

## 2013-02-28 NOTE — Telephone Encounter (Signed)
Patient notified of results as will see Dr. Avis Epley next week and advise of labs.

## 2013-02-28 NOTE — Telephone Encounter (Signed)
Pt scheduled Monday 12/15

## 2013-02-28 NOTE — Telephone Encounter (Signed)
Patient called this AM and was notified of results of ultrasound patient says she does not see Dr. Peggye Form until next week she will advise then of what labs are needed.  Patient would like to proceed with MRI.

## 2013-03-01 ENCOUNTER — Ambulatory Visit: Payer: Medicare Other | Admitting: Internal Medicine

## 2013-03-02 NOTE — Telephone Encounter (Signed)
Faxed labs to Dr. Peggye Form as patient requested.

## 2013-03-05 ENCOUNTER — Ambulatory Visit: Payer: Self-pay | Admitting: Internal Medicine

## 2013-03-08 ENCOUNTER — Telehealth: Payer: Self-pay | Admitting: Internal Medicine

## 2013-03-08 NOTE — Telephone Encounter (Signed)
Pt advised and states an understanding 

## 2013-03-08 NOTE — Telephone Encounter (Signed)
i received the MRI results of her liver.  there are no masses or stones .  GOOD NEWS

## 2013-03-23 ENCOUNTER — Ambulatory Visit (INDEPENDENT_AMBULATORY_CARE_PROVIDER_SITE_OTHER): Payer: Medicare Other | Admitting: Internal Medicine

## 2013-03-23 ENCOUNTER — Encounter: Payer: Self-pay | Admitting: Internal Medicine

## 2013-03-23 VITALS — BP 120/80 | HR 89 | Temp 98.7°F | Resp 12 | Wt 206.0 lb

## 2013-03-23 DIAGNOSIS — K7689 Other specified diseases of liver: Secondary | ICD-10-CM

## 2013-03-23 DIAGNOSIS — R748 Abnormal levels of other serum enzymes: Secondary | ICD-10-CM

## 2013-03-23 DIAGNOSIS — E669 Obesity, unspecified: Secondary | ICD-10-CM

## 2013-03-23 DIAGNOSIS — K760 Fatty (change of) liver, not elsewhere classified: Secondary | ICD-10-CM

## 2013-03-23 NOTE — Progress Notes (Signed)
Patient ID: Heidi Walsh, female   DOB: 1945-12-16, 68 y.o.   MRN: [id number]   Patient Active Problem List   Diagnosis Date Noted  . Hepatic steatosis 03/25/2013  . S/P bilateral mastectomy 02/27/2013  . Elevated liver enzymes 02/22/2013  . breast cancer   . Obesity (BMI 30-39.9) 11/17/2011  . Knee pain 11/17/2011  . Sleep apnea 11/17/2011  . Atrial fibrillation 11/17/2011  . History of breast cancer 11/17/2011    Subjective:  CC:   Chief Complaint  Patient presents with  . Follow-up    HPI:   Heidi Walsh a 68 y.o. female who presents Here to discuss results of recent MRCP of liver doe to investigate e dilated CBD seen on ultrasound done to investigate elevated liver enzymes.  Her oncologist reviewed the MRCP report with her but she has been very anxious about the possibility of a second CA given her history of breast cancer after years of having "stable calcifications" on mammogram.   Past Medical History  Diagnosis Date  . Atrial fibrillation 2010    Followed by Dr. Jordan Hawks  . Mitral regurgitation     and prolapse  . Actinic keratosis     managed by Darlis Loan  . Osteopenia 56    Stable by repeat scans  . Breast cyst 2003    aspirated, fibroystics breasts with stable calcifications on left breast  . Leukopenia   . breast cancer 2010    s/p mastectomy    Past Surgical History  Procedure Laterality Date  . Mastectomy  02/2009    Bilateral  . Cesarean section      x 2  . Shoulder surgery      left rotator cuff  . Knee arthroscopy w/ meniscectomy  1997  . Atrial ablation surgery  Dec 2012    [facility]       The following portions of the patient's history were reviewed and updated as appropriate: Allergies, current medications, and problem list.    Review of Systems:   12 Pt  review of systems was negative except those addressed in the HPI,     History   Social History  . Marital Status: Married    Spouse Name: N/A    Number of Children: N/A   . Years of Education: N/A   Occupational History  . Not on file.   Social History Main Topics  . Smoking status: Never Smoker   . Smokeless tobacco: Never Used  . Alcohol Use: Yes     Comment: Moderate  . Drug Use: No  . Sexual Activity: Not on file   Other Topics Concern  . Not on file   Social History Narrative  . No narrative on file    Objective:  Filed Vitals:   03/23/13 0955  BP: 120/80  Pulse: 89  Temp: 98.7 F (37.1 C)  Resp: 12     General appearance: alert, cooperative and appears stated age Ears: normal TM's and external ear canals both ears Throat: lips, mucosa, and tongue normal; teeth and gums normal Neck: no adenopathy, no carotid bruit, supple, symmetrical, trachea midline and thyroid not enlarged, symmetric, no tenderness/mass/nodules Back: symmetric, no curvature. ROM normal. No CVA tenderness. Lungs: clear to auscultation bilaterally Heart: regular rate and rhythm, S1, S2 normal, no murmur, click, rub or gallop Abdomen: soft, non-tender; bowel sounds normal; no masses,  no organomegaly Pulses: 2+ and symmetric Skin: Skin color, texture, turgor normal. No rashes or lesions Lymph nodes: Cervical, supraclavicular,  and axillary nodes normal.  Assessment and Plan:  Elevated liver enzymes secondary to hepatic steatosis,  MRCP was  done due to mildly dilated CBD without gallstones on ultrasound.  No masses seen.   Obesity (BMI 30-39.9) I have addressed  BMI and recommended wt loss of 10% of body weight over the next 6 months using a low glycemic index diet and regular exercise a minimum of 5 days per week.    Hepatic steatosis Presumed by ultrasound changes and serologies negative for autoimmune causes of hepatitis.  Current liver enzymes are normal and all modifiable risk factors including obesity have been addressed    Updated Medication List Outpatient Encounter Prescriptions as of 03/23/2013  Medication Sig  . aspirin 81 MG tablet Take 81 mg  by mouth 2 (two) times daily.  . Calcium Carbonate-Vitamin D (CALCIUM-VITAMIN D) 500-200 MG-UNIT per tablet Take 1 tablet by mouth 2 (two) times daily with a meal.    . cetirizine (ZYRTEC) 10 MG tablet Take 10 mg by mouth daily.    . Doxylamine Succinate, Sleep, (SLEEP AID PO) Take by mouth.  . hydrochlorothiazide 25 MG tablet Take 25 mg by mouth daily.    . metoprolol (TOPROL-XL) 50 MG 24 hr tablet Take 25 mg by mouth daily.   . Multiple Vitamin (MULTIVITAMIN) tablet Take 1 tablet by mouth daily.    . [DISCONTINUED] apixaban (ELIQUIS) 5 MG TABS tablet Take by mouth 2 (two) times daily.  . [DISCONTINUED] b complex vitamins capsule Take 1 capsule by mouth daily.    . [DISCONTINUED] dicyclomine (BENTYL) 10 MG capsule 1-2 tabs po q6h prn abdominal cramping  . [DISCONTINUED] diphenhydramine-acetaminophen (TYLENOL PM) 25-500 MG TABS Take 1 tablet by mouth at bedtime as needed.  . [DISCONTINUED] flecainide (TAMBOCOR) 150 MG tablet Take 150 mg by mouth 2 (two) times daily.  . [DISCONTINUED] HYDROcodone-acetaminophen (VICODIN) 5-500 MG per tablet Take 1 tablet by mouth every 6 (six) hours as needed.  . [DISCONTINUED] letrozole (FEMARA) 2.5 MG tablet Take 2.5 mg by mouth daily.    . [DISCONTINUED] Omega 3-6-9 Fatty Acids (OMEGA 3-6-9 COMPLEX PO) Take by mouth.  . [DISCONTINUED] sulfamethoxazole-trimethoprim (BACTRIM DS) 800-160 MG per tablet Take 1 tablet by mouth 2 (two) times daily.  . [DISCONTINUED] TDaP (BOOSTRIX) 5-2.5-18.5 LF-MCG/0.5 injection Inject 0.5 mLs into the muscle once.     No orders of the defined types were placed in this encounter.    No Follow-up on file.

## 2013-03-23 NOTE — Assessment & Plan Note (Addendum)
secondary to hepatic steatosis,  MRCP was  done due to mildly dilated CBD without gallstones on ultrasound.  No masses seen.

## 2013-03-23 NOTE — Progress Notes (Signed)
Pre-visit discussion using our clinic review tool. No additional management support is needed unless otherwise documented below in the visit note.  

## 2013-03-25 ENCOUNTER — Encounter: Payer: Self-pay | Admitting: Internal Medicine

## 2013-03-25 DIAGNOSIS — K760 Fatty (change of) liver, not elsewhere classified: Secondary | ICD-10-CM | POA: Insufficient documentation

## 2013-03-25 NOTE — Assessment & Plan Note (Signed)
Presumed by ultrasound changes and serologies negative for autoimmune causes of hepatitis.  Current liver enzymes are normal and all modifiable risk factors including obesity have been addressed

## 2013-03-25 NOTE — Assessment & Plan Note (Signed)
I have addressed  BMI and recommended wt loss of 10% of body weight over the next 6 months using a low glycemic index diet and regular exercise a minimum of 5 days per week.   

## 2013-07-23 ENCOUNTER — Ambulatory Visit (INDEPENDENT_AMBULATORY_CARE_PROVIDER_SITE_OTHER): Payer: Medicare Other | Admitting: Adult Health

## 2013-07-23 ENCOUNTER — Encounter: Payer: Self-pay | Admitting: Adult Health

## 2013-07-23 VITALS — BP 110/70 | HR 94 | Temp 98.5°F | Resp 16 | Wt 208.8 lb

## 2013-07-23 DIAGNOSIS — R053 Chronic cough: Secondary | ICD-10-CM | POA: Insufficient documentation

## 2013-07-23 DIAGNOSIS — R05 Cough: Secondary | ICD-10-CM

## 2013-07-23 DIAGNOSIS — R059 Cough, unspecified: Secondary | ICD-10-CM

## 2013-07-23 DIAGNOSIS — J329 Chronic sinusitis, unspecified: Secondary | ICD-10-CM | POA: Insufficient documentation

## 2013-07-23 DIAGNOSIS — J019 Acute sinusitis, unspecified: Secondary | ICD-10-CM | POA: Insufficient documentation

## 2013-07-23 MED ORDER — AZITHROMYCIN 250 MG PO TABS: ORAL_TABLET | ORAL | Status: DC

## 2013-07-23 MED ORDER — GUAIFENESIN-CODEINE 100-10 MG/5ML PO SOLN: 5.0000 mL | Freq: Three times a day (TID) | ORAL | Status: DC | PRN

## 2013-07-23 NOTE — Progress Notes (Signed)
   Subjective:    Patient ID: Heidi Walsh, female    DOB: 08-Jan-1946, 68 y.o.   MRN: [id number]  HPI Patient is a pleasant 68 year old female who presents to clinic with cough, sinus pressure, postnasal drip. Symptoms began on Friday. She denies fever or chills. She believes her symptoms were initially trigger by pollen. She has coughed up a thick, small green sputum but, again, thinks this is from pollen.    Past Medical History  Diagnosis Date  . Atrial fibrillation 2010    Followed by Dr. Jordan Hawks  . Mitral regurgitation     and prolapse  . Actinic keratosis     managed by Darlis Loan  . Osteopenia 56    Stable by repeat scans  . Breast cyst 2003    aspirated, fibroystics breasts with stable calcifications on left breast  . Leukopenia   . breast cancer 2010    s/p mastectomy   Current Outpatient Prescriptions on File Prior to Visit  Medication Sig Dispense Refill  . aspirin 81 MG tablet Take 81 mg by mouth 2 (two) times daily.      . Calcium Carbonate-Vitamin D (CALCIUM-VITAMIN D) 500-200 MG-UNIT per tablet Take 1 tablet by mouth 2 (two) times daily with a meal.        . cetirizine (ZYRTEC) 10 MG tablet Take 10 mg by mouth daily.        . Doxylamine Succinate, Sleep, (SLEEP AID PO) Take by mouth.      . hydrochlorothiazide 25 MG tablet Take 25 mg by mouth daily.        . metoprolol (TOPROL-XL) 50 MG 24 hr tablet Take 25 mg by mouth daily.       . Multiple Vitamin (MULTIVITAMIN) tablet Take 1 tablet by mouth daily.         No current facility-administered medications on file prior to visit.    Review of Systems  Constitutional: Negative for fever and chills.  HENT: Positive for congestion, postnasal drip, rhinorrhea, sinus pressure and sneezing. Negative for sore throat.   Respiratory: Positive for cough.   All other systems reviewed and are negative.      Objective:   Physical Exam  Constitutional: She is oriented to person, place, and time. She appears  well-developed and well-nourished. No distress.  HENT:  Head: Normocephalic and atraumatic.  Right Ear: External ear normal.  Left Ear: External ear normal.  Mouth/Throat: No oropharyngeal exudate.  Cardiovascular: Normal rate, regular rhythm and normal heart sounds.  Exam reveals no gallop.   No murmur heard. Pulmonary/Chest: Effort normal and breath sounds normal. No respiratory distress. She has no wheezes. She has no rales.  Lymphadenopathy:    She has no cervical adenopathy.  Neurological: She is alert and oriented to person, place, and time.  Psychiatric: She has a normal mood and affect. Her behavior is normal. Judgment and thought content normal.       Assessment & Plan:   1. Cough Robitussin AC for severe cough. Will cause sedation Delsym for less severe coughing.   2. Sinusitis Irrigate sinuses. Continue to take your zyrtec Provided pt with prescription for Azithromycin and instructed to only fill if she develops fever greater than 101, he didn't have thick productive sputum, shortness of breath or wheezing. RTC if no improvement within 4-5 days or sooner if necessary.

## 2013-07-23 NOTE — Progress Notes (Signed)
Pre visit review using our clinic review tool, if applicable. No additional management support is needed unless otherwise documented below in the visit note. 

## 2013-07-23 NOTE — Patient Instructions (Signed)
  Prescription for Robitussin a.c. for severe cough. This medication contains guaifenesin and codeine. Codeine will cause sedation. Do not drive if taking this medication during the day.  You can also take over-the-counter Delsym with guaifenesin and dextromethorphan for less severe coughing.  Irrigate sinuses with saline.  I am giving you a prescription for azithromycin. Fill this medication only if you develop a fever greater than 101, have more productive sputum, shortness of breath or wheezing.

## 2013-09-20 ENCOUNTER — Ambulatory Visit (INDEPENDENT_AMBULATORY_CARE_PROVIDER_SITE_OTHER): Payer: Medicare Other | Admitting: Internal Medicine

## 2013-09-20 ENCOUNTER — Encounter: Payer: Self-pay | Admitting: Internal Medicine

## 2013-09-20 ENCOUNTER — Other Ambulatory Visit (HOSPITAL_COMMUNITY)
Admission: RE | Admit: 2013-09-20 | Discharge: 2013-09-20 | Disposition: A | Discharge status: Home / Self Care | Payer: Medicare Other | Source: Ambulatory Visit | Attending: Internal Medicine | Admitting: Internal Medicine

## 2013-09-20 VITALS — BP 118/86 | HR 85 | Temp 98.2°F | Resp 14 | Ht 67.25 in | Wt 210.0 lb

## 2013-09-20 DIAGNOSIS — Z0000 Encounter for general adult medical examination without abnormal findings: Secondary | ICD-10-CM

## 2013-09-20 DIAGNOSIS — R748 Abnormal levels of other serum enzymes: Secondary | ICD-10-CM

## 2013-09-20 DIAGNOSIS — Z124 Encounter for screening for malignant neoplasm of cervix: Secondary | ICD-10-CM

## 2013-09-20 DIAGNOSIS — Z1151 Encounter for screening for human papillomavirus (HPV): Secondary | ICD-10-CM | POA: Insufficient documentation

## 2013-09-20 DIAGNOSIS — Z23 Encounter for immunization: Secondary | ICD-10-CM

## 2013-09-20 DIAGNOSIS — E669 Obesity, unspecified: Secondary | ICD-10-CM

## 2013-09-20 DIAGNOSIS — Z01419 Encounter for gynecological examination (general) (routine) without abnormal findings: Secondary | ICD-10-CM | POA: Insufficient documentation

## 2013-09-20 MED ORDER — TETANUS-DIPHTH-ACELL PERTUSSIS 5-2.5-18.5 LF-MCG/0.5 IM SUSP: 0.5000 mL | Freq: Once | INTRAMUSCULAR | Status: DC

## 2013-09-20 NOTE — Progress Notes (Signed)
Pre visit review using our clinic review tool, if applicable. No additional management support is needed unless otherwise documented below in the visit note. 

## 2013-09-20 NOTE — Patient Instructions (Addendum)
You had your annual  wellness exam today.     We will schedule your mammogram at [facility] in December .   You received the  Prevnar, (pneumonia) vaccine today,  Your still need your tetanus-diptheria-pertussis vaccine (TDaP) but you can get it for less $$$ at a local pharmacy with the script I have provided you.  Please wait at least a month to get it.   We will contact you with the PAP smear  results

## 2013-09-23 DIAGNOSIS — Z0000 Encounter for general adult medical examination without abnormal findings: Secondary | ICD-10-CM | POA: Insufficient documentation

## 2013-09-23 NOTE — Assessment & Plan Note (Signed)
I have addressed  BMI and recommended wt loss of 10% of body weigh over the next 6 months using a low glycemic index diet and regular exercise a minimum of 5 days per week.   

## 2013-09-23 NOTE — Assessment & Plan Note (Signed)
Her ultrasound noted a dilated common bile duct but no gallstones and the and the appearance of  fatty liver.

## 2013-09-23 NOTE — Assessment & Plan Note (Signed)
Annual comprehensive exam was done including pelvic and PAP smear. All screenings have been addressed .  

## 2013-09-23 NOTE — Progress Notes (Signed)
Patient ID: Heidi Walsh, female   DOB: [birth year], 68 y.o.   MRN: [id number] The patient is here for her Welcome to Medicare wellness examination and management of other chronic and acute problems.   The risk factors are reflected in the social history.  The roster of all physicians providing medical care to patient - is listed in the Snapshot section of the chart.  Activities of daily living:  The patient is 100% independent in all ADLs: dressing, toileting, feeding as well as independent mobility  Home safety : The patient has smoke detectors in the home. They wear seatbelts.  There are no firearms at home. There is no violence in the home.   There is no risks for hepatitis, STDs or HIV. There is no   history of blood transfusion. They have no travel history to infectious disease endemic areas of the world.  The patient has seen their dentist in the last six month. They have seen their eye doctor in the last year. They admit to slight hearing difficulty with regard to whispered voices and some television programs.  They have deferred audiologic testing in the last year.  They do not  have excessive sun exposure. Discussed the need for sun protection: hats, long sleeves and use of sunscreen if there is significant sun exposure.   Diet: the importance of a healthy diet is discussed. They do have a healthy diet.  The benefits of regular aerobic exercise were discussed. She walks 4 times per week ,  20 minutes.   Depression screen: there are no signs or vegative symptoms of depression- irritability, change in appetite, anhedonia, sadness/tearfullness.  Cognitive assessment: the patient manages all their financial and personal affairs and is actively engaged. They could relate day,date,year and events; recalled 2/3 objects at 3 minutes; performed clock-face test normally.  The following portions of the patient's history were reviewed and updated as appropriate: allergies, current medications,  past family history, past medical history,  past surgical history, past social history  and problem list.  Visual acuity was not assessed per patient preference since she has regular follow up with her ophthalmologist. Hearing and body mass index were assessed and reviewed.   During the course of the visit the patient was educated and counseled about appropriate screening and preventive services including : fall prevention , diabetes screening, nutrition counseling, colorectal cancer screening, and recommended immunizations.    Objective:  BP 118/86  Pulse 85  Temp(Src) 98.2 F (36.8 C) (Oral)  Resp 14  Ht 5' 7.25" (1.708 m)  Wt 210 lb (95.255 kg)  BMI 32.65 kg/m2  SpO2 96%   General Appearance:    Alert, cooperative, no distress, appears stated age  Head:    Normocephalic, without obvious abnormality, atraumatic  Eyes:    PERRL, conjunctiva/corneas clear, EOM's intact, fundi    benign, both eyes  Ears:    Normal TM's and external ear canals, both ears  Nose:   Nares normal, septum midline, mucosa normal, no drainage    or sinus tenderness  Throat:   Lips, mucosa, and tongue normal; teeth and gums normal  Neck:   Supple, symmetrical, trachea midline, no adenopathy;    thyroid:  no enlargement/tenderness/nodules; no carotid   bruit or JVD  Back:     Symmetric, no curvature, ROM normal, no CVA tenderness  Lungs:     Clear to auscultation bilaterally, respirations unlabored  Chest Wall:    No tenderness or deformity   Heart:  Regular rate and rhythm, S1 and S2 normal, no murmur, rub   or gallop  Breast Exam:    No tenderness, masses, or nipple abnormality  Abdomen:     Soft, non-tender, bowel sounds active all four quadrants,    no masses, no organomegaly  Genitalia:    Pelvic: cervix normal in appearance, external genitalia normal, no adnexal masses or tenderness, no cervical motion tenderness, rectovaginal septum normal, uterus normal size, shape, and consistency and vagina  normal without discharge  Extremities:   Extremities normal, atraumatic, no cyanosis or edema  Pulses:   2+ and symmetric all extremities  Skin:   Skin color, texture, turgor normal, no rashes or lesions  Lymph nodes:   Cervical, supraclavicular, and axillary nodes normal  Neurologic:   CNII-XII intact, normal strength, sensation and reflexes    throughout   Assessment and Plan  Elevated liver enzymes Her ultrasound noted a dilated common bile duct but no gallstones and the and the appearance of  fatty liver.   Obesity (BMI 30-39.9) I have addressed  BMI and recommended wt loss of 10% of body weigh over the next 6 months using a low glycemic index diet and regular exercise a minimum of 5 days per week.    Routine general medical examination at a health care facility Annual comprehensive exam was done including  pelvic and PAP smear. All screenings have been addressed .    Updated Medication List Outpatient Encounter Prescriptions as of 09/20/2013  Medication Sig  . aspirin 81 MG tablet Take 81 mg by mouth 2 (two) times daily.  . Calcium Carbonate-Vitamin D (CALCIUM-VITAMIN D) 500-200 MG-UNIT per tablet Take 1 tablet by mouth 2 (two) times daily with a meal.    . cetirizine (ZYRTEC) 10 MG tablet Take 10 mg by mouth daily.    . Doxylamine Succinate, Sleep, (SLEEP AID PO) Take by mouth.  . hydrochlorothiazide 25 MG tablet Take 25 mg by mouth daily.    . metoprolol succinate (TOPROL-XL) 25 MG 24 hr tablet Take 25 mg by mouth daily.  . Multiple Vitamin (MULTIVITAMIN) tablet Take 1 tablet by mouth daily.    . Tdap (BOOSTRIX) 5-2.5-18.5 LF-MCG/0.5 injection Inject 0.5 mLs into the muscle once.  . [DISCONTINUED] azithromycin (ZITHROMAX) 250 MG tablet Start 2 tablets today then 1 tablet daily for 4 days.  . [DISCONTINUED] guaiFENesin-codeine 100-10 MG/5ML syrup Take 5 mLs by mouth 3 (three) times daily as needed.  . [DISCONTINUED] metoprolol (TOPROL-XL) 50 MG 24 hr tablet Take 25 mg by mouth  daily.

## 2013-09-26 LAB — CYTOLOGY - PAP

## 2013-09-27 ENCOUNTER — Encounter: Payer: Self-pay | Admitting: Internal Medicine

## 2013-10-03 ENCOUNTER — Encounter: Payer: Self-pay | Admitting: Internal Medicine

## 2013-12-31 ENCOUNTER — Telehealth: Payer: Self-pay | Admitting: Internal Medicine

## 2013-12-31 NOTE — Telephone Encounter (Signed)
Left  Message for patient to call office to schedule appointment for surgical clearance and sent a [employer].

## 2014-01-01 NOTE — Telephone Encounter (Signed)
Patient 's surgery has been postponed until 03/18/14, patient made an appointment for Surgical clearance 02/12/14. FYI

## 2014-01-31 ENCOUNTER — Ambulatory Visit: Payer: Self-pay | Admitting: Physician Assistant

## 2014-02-12 ENCOUNTER — Encounter: Payer: Self-pay | Admitting: Internal Medicine

## 2014-02-12 ENCOUNTER — Ambulatory Visit (INDEPENDENT_AMBULATORY_CARE_PROVIDER_SITE_OTHER): Payer: Medicare Other | Admitting: Internal Medicine

## 2014-02-12 VITALS — BP 110/78 | HR 89 | Temp 98.3°F | Resp 14 | Ht 66.5 in | Wt 206.5 lb

## 2014-02-12 DIAGNOSIS — R748 Abnormal levels of other serum enzymes: Secondary | ICD-10-CM

## 2014-02-12 DIAGNOSIS — Z01818 Encounter for other preprocedural examination: Secondary | ICD-10-CM

## 2014-02-12 DIAGNOSIS — G4730 Sleep apnea, unspecified: Secondary | ICD-10-CM

## 2014-02-12 DIAGNOSIS — E669 Obesity, unspecified: Secondary | ICD-10-CM

## 2014-02-12 NOTE — Progress Notes (Signed)
Pre-visit discussion using our clinic review tool. No additional management support is needed unless otherwise documented below in the visit note.  

## 2014-02-12 NOTE — Progress Notes (Signed)
Patient ID: Heidi Walsh, female   DOB: 03-Feb-1946, 68 y.o.   MRN: [id number]   Patient Active Problem List   Diagnosis Date Noted  . Preoperative evaluation of a medical condition to rule out surgical contraindications (TAR required) 02/14/2014  . Routine general medical examination at a health care facility 09/23/2013  . Hepatic steatosis 03/25/2013  . S/P bilateral mastectomy 02/27/2013  . Elevated liver enzymes 02/22/2013  . breast cancer   . Obesity (BMI 30-39.9) 11/17/2011  . 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 Sleep apnea 11/17/2011  . Atrial fibrillation 11/17/2011  . History of breast cancer 11/17/2011    Subjective:  CC:   Chief Complaint  Patient presents with  . Procedure    Surgical clearance. patient has had cardiology clearance.  . Urticaria    Patient just finished 12 day prednisone taper. would like to know how long she needs to wait to get flu vaccine.    HPI:   Heidi Walsh is a 68 y.o. female who presents for  Preoperative clearance for left knee replacement, which is scheduled for Dec 28th in [place] by [place].  She has already been cleared by cardiologist as low risk  She is recovering from a viral URI which was complicated by urticaria and hives which she developed after travelling by plane out [place].  She finished a 12 day prednisone taper yesterday, and is requesting an influenza vaccine,  Which I have advised her to delay for 10 days.        Past Medical History  Diagnosis Date  . Atrial fibrillation 2010    Followed by Dr. Jordan Hawks  . Mitral regurgitation     and prolapse  . Actinic keratosis     managed by Darlis Loan  . Osteopenia 56    Stable by repeat scans  . Breast cyst 2003    aspirated, fibroystics breasts with stable calcifications on left breast  . Leukopenia   . breast cancer 2010    s/p mastectomy    Past Surgical History  Procedure Laterality Date  . Mastectomy  02/2009    Bilateral   . Cesarean section      x 2  . Shoulder surgery      left rotator cuff  . Knee arthroscopy w/ meniscectomy  1997  . Atrial ablation surgery  Dec 2012    [facility]       The following portions of the patient's history were reviewed and updated as appropriate: Allergies, current medications, and problem list.    Review of Systems:   Patient denies headache, fevers, malaise, unintentional weight loss, skin rash, eye pain, sinus congestion and sinus pain, sore throat, dysphagia,  hemoptysis , cough, dyspnea, wheezing, chest pain, palpitations, orthopnea, edema, abdominal pain, nausea, melena, diarrhea, constipation, flank pain, dysuria, hematuria, urinary  Frequency, nocturia, numbness, tingling, seizures,  Focal weakness, Loss of consciousness,  Tremor, insomnia, depression, anxiety, and suicidal ideation.     History   Social History  . Marital Status: Married    Spouse Name: N/A    Number of Children: N/A  . Years of Education: N/A   Occupational History  . Not on file.   Social History Main Topics  . Smoking status: Never Smoker   . Smokeless tobacco: Never Used  . Alcohol Use: Yes     Comment: Moderate  . Drug Use: No  . Sexual Activity: Not on file   Other Topics Concern  .  Not on file   Social History Narrative    Objective:  Filed Vitals:   02/12/14 1339  BP: 110/78  Pulse: 89  Temp: 98.3 F (36.8 C)  Resp: 14     General appearance: alert, cooperative and appears stated age Ears: normal TM's and external ear canals both ears Throat: lips, mucosa, and tongue normal; teeth and gums normal Neck: no adenopathy, no carotid bruit, supple, symmetrical, trachea midline and thyroid not enlarged, symmetric, no tenderness/mass/nodules Back: symmetric, no curvature. ROM normal. No CVA tenderness. Lungs: clear to auscultation bilaterally Heart: regular rate and rhythm, S1, S2 normal, no murmur, click, rub or gallop Abdomen: soft, non-tender; bowel sounds normal;  no masses,  no organomegaly Pulses: 2+ and symmetric Skin: Skin color, texture, turgor normal. No rashes or lesions Lymph nodes: Cervical, supraclavicular, and axillary nodes normal.  Assessment and Plan:  Sleep apnea Diagnosed by sleep study. She is wearing her CPAP every night a minimum of 6 hours per night and notes improved daytime wakefulness and decreased fatigue   Elevated liver enzymes Presumed by ultrasound changes and serologies negative for autoimmune causes of hepatitis.  ultrasound noted a dilated common bile duct but no gallstones, and the appearance of  fatty liver.  Additional investigation was done with MRCP which was done Dec 2013.    No masses, gallstones,  or Intrahepatic ductal dilatation .   Obesity (BMI 30-39.9) I have addressed  BMI and recommended wt loss of 10% of body weight over the next 6 months using a low glycemic index diet and regular exercise a minimum of 5 days per week.    Preoperative evaluation of a medical condition to rule out surgical contraindications (TAR required) Patient has been cleared by cardiology as low risk for perioperative complications .  Her history of sleep apnea has been addressed with CPAP and she has been advised to notify the anesthesiologist of her condition and to bring her CPAP machine with her the day of her surgery.    Updated Medication List Outpatient Encounter Prescriptions as of 02/12/2014  Medication Sig  . aspirin 81 MG tablet Take 81 mg by mouth 2 (two) times daily.  . Calcium Carbonate-Vitamin D (CALCIUM-VITAMIN D) 500-200 MG-UNIT per tablet Take 1 tablet by mouth 2 (two) times daily with a meal.    . cetirizine (ZYRTEC) 10 MG tablet Take 10 mg by mouth daily.    . Doxylamine Succinate, Sleep, (SLEEP AID PO) Take by mouth.  . hydrochlorothiazide 25 MG tablet Take 25 mg by mouth daily.    . metoprolol succinate (TOPROL-XL) 25 MG 24 hr tablet Take 25 mg by mouth daily.  . Multiple Vitamin (MULTIVITAMIN) tablet Take  1 tablet by mouth daily.    . Tdap (BOOSTRIX) 5-2.5-18.5 LF-MCG/0.5 injection Inject 0.5 mLs into the muscle once.

## 2014-02-14 ENCOUNTER — Encounter: Payer: Self-pay | Admitting: Internal Medicine

## 2014-02-14 DIAGNOSIS — Z01818 Encounter for other preprocedural examination: Secondary | ICD-10-CM | POA: Insufficient documentation

## 2014-02-14 NOTE — Assessment & Plan Note (Signed)
Diagnosed by sleep study. She is wearing her CPAP every night a minimum of 6 hours per night and notes improved daytime wakefulness and decreased fatigue  

## 2014-02-14 NOTE — Assessment & Plan Note (Signed)
Patient has been cleared by cardiology as low risk for perioperative complications .  Her history of sleep apnea has been addressed with CPAP and she has been advised to notify the anesthesiologist of her condition and to bring her CPAP machine with her the day of her surgery.

## 2014-02-14 NOTE — Assessment & Plan Note (Signed)
I have addressed  BMI and recommended wt loss of 10% of body weight over the next 6 months using a low glycemic index diet and regular exercise a minimum of 5 days per week.   

## 2014-02-14 NOTE — Assessment & Plan Note (Addendum)
Presumed by ultrasound changes and serologies negative for autoimmune causes of hepatitis.  ultrasound noted a dilated common bile duct but no gallstones, and the appearance of  fatty liver.  Additional investigation was done with MRCP which was done Dec 2013.    No masses, gallstones,  or Intrahepatic ductal dilatation .

## 2014-02-19 HISTORY — PX: TOTAL KNEE ARTHROPLASTY: SHX125

## 2014-07-02 ENCOUNTER — Encounter: Payer: Self-pay | Admitting: Internal Medicine

## 2014-09-01 ENCOUNTER — Other Ambulatory Visit (HOSPITAL_COMMUNITY): Payer: Self-pay

## 2014-09-01 ENCOUNTER — Other Ambulatory Visit: Payer: Self-pay

## 2014-09-09 ENCOUNTER — Telehealth: Payer: Self-pay

## 2014-09-09 NOTE — Telephone Encounter (Signed)
LMTCB regarding scheduling a mammogram

## 2014-09-24 ENCOUNTER — Telehealth: Payer: Self-pay | Admitting: Internal Medicine

## 2014-09-24 DIAGNOSIS — Z1159 Encounter for screening for other viral diseases: Secondary | ICD-10-CM

## 2014-09-24 DIAGNOSIS — E559 Vitamin D deficiency, unspecified: Secondary | ICD-10-CM

## 2014-09-24 DIAGNOSIS — E785 Hyperlipidemia, unspecified: Secondary | ICD-10-CM

## 2014-09-24 DIAGNOSIS — R5383 Other fatigue: Secondary | ICD-10-CM

## 2014-09-24 NOTE — Telephone Encounter (Signed)
Please place lab orders, none since 2014 . CPE is scheduled 10/28/14

## 2014-09-24 NOTE — Telephone Encounter (Signed)
Pt wanted to know if she needs fasting labs prior cpe appt. Please advise. No orders in system/msn

## 2014-09-25 NOTE — Telephone Encounter (Signed)
Yes, fasting labs ordered for pre CPE

## 2014-10-25 ENCOUNTER — Other Ambulatory Visit (INDEPENDENT_AMBULATORY_CARE_PROVIDER_SITE_OTHER): Payer: Medicare Other

## 2014-10-25 DIAGNOSIS — R5383 Other fatigue: Secondary | ICD-10-CM

## 2014-10-28 ENCOUNTER — Ambulatory Visit (INDEPENDENT_AMBULATORY_CARE_PROVIDER_SITE_OTHER): Payer: Medicare Other | Admitting: Internal Medicine

## 2014-10-28 ENCOUNTER — Encounter: Payer: Self-pay | Admitting: Internal Medicine

## 2014-10-28 ENCOUNTER — Other Ambulatory Visit: Payer: Self-pay

## 2014-10-28 VITALS — BP 118/80 | HR 81 | Temp 98.5°F | Resp 14 | Ht 67.0 in | Wt 217.4 lb

## 2014-10-28 DIAGNOSIS — Z0000 Encounter for general adult medical examination without abnormal findings: Secondary | ICD-10-CM

## 2014-10-28 DIAGNOSIS — Z96652 Presence of left artificial knee joint: Secondary | ICD-10-CM

## 2014-10-28 DIAGNOSIS — Z853 Personal history of malignant neoplasm of breast: Secondary | ICD-10-CM

## 2014-10-28 DIAGNOSIS — K760 Fatty (change of) liver, not elsewhere classified: Secondary | ICD-10-CM

## 2014-10-28 DIAGNOSIS — Z13820 Encounter for screening for osteoporosis: Secondary | ICD-10-CM

## 2014-10-28 NOTE — Progress Notes (Signed)
Pre visit review using our clinic review tool, if applicable. No additional management support is needed unless otherwise documented below in the visit note. 

## 2014-10-28 NOTE — Progress Notes (Signed)
Patient ID: Heidi Walsh, female    DOB: 04-10-1945  Age: 69 y.o. MRN: [id number]  The patient is here for annual  wellness examination and management of other chronic and acute problems.   Osteopenia,  Was treated for Boniva prior to diagnosis of BRCA in 2010.  has been Untreated foe over 5 years. Previously managed by  endocrinology at [facility] to be ordered ,  Needs it done at [facility] in [facility]   The risk factors are reflected in the social history.  The roster of all physicians providing medical care to patient - is listed in the Snapshot section of the chart.  Activities of daily living:  The patient is 100% independent in all ADLs: dressing, toileting, feeding as well as independent mobility  Home safety : The patient has smoke detectors in the home. They wear seatbelts.  There are no firearms at home. There is no violence in the home.   There is no risks for hepatitis, STDs or HIV. There is no   history of blood transfusion. They have no travel history to infectious disease endemic areas of the world.  The patient has seen their dentist in the last six month. They have seen their eye doctor in the last year. They admit to slight hearing difficulty with regard to whispered voices and some television programs.  They have deferred audiologic testing in the last year.  They do not  have excessive sun exposure. Discussed the need for sun protection: hats, long sleeves and use of sunscreen if there is significant sun exposure.   Diet: the importance of a healthy diet is discussed. They do have a healthy diet.  The benefits of regular aerobic exercise were discussed. She walks 4 times per week ,  20 minutes.   Depression screen: there are no signs or vegative symptoms of depression- irritability, change in appetite, anhedonia, sadness/tearfullness.  Cognitive assessment: the patient manages all their financial and personal affairs and is actively engaged. They could  relate day,date,year and events; recalled 2/3 objects at 3 minutes; performed clock-face test normally.  The following portions of the patient's history were reviewed and updated as appropriate: allergies, current medications, past family history, past medical history,  past surgical history, past social history  and problem list.  Visual acuity was not assessed per patient preference since she has regular follow up with her ophthalmologist. Hearing and body mass index were assessed and reviewed.   During the course of the visit the patient was educated and counseled about appropriate screening and preventive services including : fall prevention , diabetes screening, nutrition counseling, colorectal cancer screening, and recommended immunizations.    CC: The primary encounter diagnosis was Medicare annual wellness visit, subsequent. Diagnoses of Hepatic steatosis, Status post total left knee replacement, History of breast cancer, and Screening for osteoporosis were also pertinent to this visit.  left hip and thigh pain. she underwent a left TKR since her last visit.  Left knee surgery has not been as easy to recover from due to interval development of  IT band tightness  and hamstring issues  During the rehab process.  She has been prescribed steroids intermittently ,  30 days at a time by Dr Derald Macleod her orthopedist and has gained weight as a results.  Still has left  hip and left  groin pain.    Obesity :  Has been exercising in the pool and using interval training to raise her heart rate.  Frustrated  by inability to lose more weight. .Diet reviewed.  Portion control is the issue  , diet is otherwise excellent and Mediterranean styl.e   Vaginitis:  Has been prescribed low dose topical estrogen .001 vaginal estrogen      History Heidi Walsh has a past medical history of Atrial fibrillation (2010); Mitral regurgitation; Actinic keratosis; Osteopenia (56); Breast cyst (2003); Leukopenia; and breast cancer  (2010).   She has past surgical history that includes Mastectomy (02/2009); Cesarean section; Shoulder surgery; Knee arthroscopy w/ meniscectomy (1997); Atrial ablation surgery (Dec 2012); and Total knee arthroplasty (02/2014).   Her family history includes COPD in her mother; Hepatitis in her sister; Hypertension in her father.She reports that she has never smoked. She has never used smokeless tobacco. She reports that she drinks alcohol. She reports that she does not use illicit drugs.  Outpatient Prescriptions Prior to Visit  Medication Sig Dispense Refill  . aspirin 81 MG tablet Take 81 mg by mouth 2 (two) times daily.    . Calcium Carbonate-Vitamin D (CALCIUM-VITAMIN D) 500-200 MG-UNIT per tablet Take 1 tablet by mouth 2 (two) times daily with a meal.      . cetirizine (ZYRTEC) 10 MG tablet Take 10 mg by mouth daily.      . Doxylamine Succinate, Sleep, (SLEEP AID PO) Take by mouth.    . hydrochlorothiazide 25 MG tablet Take 25 mg by mouth daily.      . metoprolol succinate (TOPROL-XL) 25 MG 24 hr tablet Take 25 mg by mouth daily.    . Multiple Vitamin (MULTIVITAMIN) tablet Take 1 tablet by mouth daily.      . Tdap (BOOSTRIX) 5-2.5-18.5 LF-MCG/0.5 injection Inject 0.5 mLs into the muscle once. 0.5 mL 0   No facility-administered medications prior to visit.    Review of Systems   Patient denies headache, fevers, malaise, unintentional weight loss, skin rash, eye pain, sinus congestion and sinus pain, sore throat, dysphagia,  hemoptysis , cough, dyspnea, wheezing, chest pain, palpitations, orthopnea, edema, abdominal pain, nausea, melena, diarrhea, constipation, flank pain, dysuria, hematuria, urinary  Frequency, nocturia, numbness, tingling, seizures,  Focal weakness, Loss of consciousness,  Tremor, insomnia, depression, anxiety, and suicidal ideation.      Objective:  BP 118/80 mmHg  Pulse 81  Temp(Src) 98.5 F (36.9 C) (Oral)  Resp 14  Ht $R'5\' 7"'CB$  (1.702 m)  Wt 217 lb 6.4 oz  (98.612 kg)  BMI 34.04 kg/m2  SpO2 98%  Physical Exam   General appearance: alert, cooperative and appears stated age Ears: normal TM's and external ear canals both ears Throat: lips, mucosa, and tongue normal; teeth and gums normal Neck: no adenopathy, no carotid bruit, supple, symmetrical, trachea midline and thyroid not enlarged, symmetric, no tenderness/mass/nodules Back: symmetric, no curvature. ROM normal. No CVA tenderness. Lungs: clear to auscultation bilaterally Heart: regular rate and rhythm, S1, S2 normal, no murmur, click, rub or gallop Abdomen: soft, non-tender; bowel sounds normal; no masses,  no organomegaly Pulses: 2+ and symmetric Skin: Skin color, texture, turgor normal. No rashes or lesions Lymph nodes: Cervical, supraclavicular, and axillary nodes normal.   Assessment & Plan:   Problem List Items Addressed This Visit      Unprioritized   Status post total left knee replacement    S/p left TKR in [place].  Rehab hindered from complete resolution of pain and stiffness due to IT band tightness .  All rehab managed by orthopedics.       History of breast cancer    She has  had no recurrence in 4 yrs and has stopped Femara after 4 years due to miserable side effects.         Hepatic steatosis    Presumed by ultrasound changes and serologies negative for autoimmune causes of hepatitis.  ultrasound noted a dilated common bile duct but no gallstones, and the appearance of  fatty liver.  Additional investigation was done with MRCP which was done Dec 2013.    No masses, gallstones,  or Intrahepatic ductal dilatation . She has not  received the Hepatitis A and B vaccines. Liver enzymes are pending        Medicare annual wellness visit, subsequent - Primary    Annual Medicare wellness  exam was done as well as a comprehensive physical exam and management of acute and chronic conditions .  During the course of the visit the patient was educated and counseled about  appropriate screening and preventive services including : fall prevention , diabetes screening, nutrition counseling, colorectal cancer screening, and recommended immunizations.  Printed recommendations for health maintenance screenings was given.        Other Visit Diagnoses    Screening for osteoporosis        Relevant Orders    DG Bone Density       I am having Heidi Walsh maintain her hydrochlorothiazide, cetirizine, multivitamin, calcium-vitamin D, (Doxylamine Succinate, Sleep, (SLEEP AID PO)), aspirin, metoprolol succinate, and Tdap.  No orders of the defined types were placed in this encounter.    There are no discontinued medications.  Follow-up: No Follow-up on file.   Crecencio Mc, MD

## 2014-10-28 NOTE — Patient Instructions (Addendum)
[employer]  Is the underwater resistance training tools we discussed   You last PAP was normal in 2015 ! We will repeat next year since you have started using estrogen  Try taking 2 Beano tablets every morning before your smoothie to help with the gas.   The  diet I discussed with you today is the 10 day Green Smoothie Cleansing /Detox Diet by Linden Dolin . available on [place] for around $10.  This is not a low carb or a weight loss diet,  It is fundamentally a "cleansing" low fat diet that eliminates sugar, gluten, caffeine, alcohol and dairy for 10 days .  What you add back after the initial ten days is entirely up to  you!  You can expect to lose 5 to 10 lbs depending on how strict you are.   I suggest drinking 2 smoothies daily and keeping one chewable meal (but keep it simple, like baked fish and salad, rice or bok choy) .  You snack primarily on fresh  fruit, egg whites and judicious quantities of nuts. You can add vegetable based protein powder (nothing with whey , since whey is dairy) in it.  [employer] has a Research officer, political party .   It does require some form of a nutrient extractor (Vita Mix, a electric juicer,  Or a Nutribullet Rx).  i have found that using frozen fruits is much more convenient and cost effective. You can even find plenty of organic fruit in the frozen fruit section of [employer].  Just thaw what you need for the following day the night before in the refrigerator (to avoid jamming up your machine)   Health Maintenance Adopting a healthy lifestyle and getting preventive care can go a long way to promote health and wellness. Talk with your health care provider about what schedule of regular examinations is right for you. This is a good chance for you to check in with your provider about disease prevention and staying healthy. In between checkups, there are plenty of things you can do on your own. Experts have done a lot of research about which lifestyle changes and preventive measures  are most likely to keep you healthy. Ask your health care provider for more information. WEIGHT AND DIET  Eat a healthy diet  Be sure to include plenty of vegetables, fruits, low-fat dairy products, and lean protein.  Do not eat a lot of foods high in solid fats, added sugars, or salt.  Get regular exercise. This is one of the most important things you can do for your health.  Most adults should exercise for at least 150 minutes each week. The exercise should increase your heart rate and make you sweat (moderate-intensity exercise).  Most adults should also do strengthening exercises at least twice a week. This is in addition to the moderate-intensity exercise.  Maintain a healthy weight  Body mass index (BMI) is a measurement that can be used to identify possible weight problems. It estimates body fat based on height and weight. Your health care provider can help determine your BMI and help you achieve or maintain a healthy weight.  For females 67 years of age and older:   A BMI below 18.5 is considered underweight.  A BMI of 18.5 to 24.9 is normal.  A BMI of 25 to 29.9 is considered overweight.  A BMI of 30 and above is considered obese.  Watch levels of cholesterol and blood lipids  You should start having your blood tested for lipids and  cholesterol at 67 years of age, then have this test every 5 years.  You may need to have your cholesterol levels checked more often if:  Your lipid or cholesterol levels are high.  You are older than 67 years of age.  You are at high risk for heart disease.  CANCER SCREENING   Lung Cancer  Lung cancer screening is recommended for adults 20-49 years old who are at high risk for lung cancer because of a history of smoking.  A yearly low-dose CT scan of the lungs is recommended for people who:  Currently smoke.  Have quit within the past 15 years.  Have at least a 30-pack-year history of smoking. A pack year is smoking an  average of one pack of cigarettes a day for 1 year.  Yearly screening should continue until it has been 15 years since you quit.  Yearly screening should stop if you develop a health problem that would prevent you from having lung cancer treatment.  Cervical Cancer Routine pelvic examinations to screen for cervical cancer are no longer recommended for nonpregnant women who are considered low risk for cancer of the pelvic organs (ovaries, uterus, and vagina) and who do not have symptoms. A pelvic examination may be necessary if you have symptoms including those associated with pelvic infections. Ask your health care provider if a screening pelvic exam is right for you.   The Pap test is the screening test for cervical cancer for women who are considered at risk.  If you had a hysterectomy for a problem that was not cancer or a condition that could lead to cancer, then you no longer need Pap tests.  If you are older than 65 years, and you have had normal Pap tests for the past 10 years, you no longer need to have Pap tests.  If you have had past treatment for cervical cancer or a condition that could lead to cancer, you need Pap tests and screening for cancer for at least 20 years after your treatment.  If you no longer get a Pap test, assess your risk factors if they change (such as having a new sexual partner). This can affect whether you should start being screened again.  Some women have medical problems that increase their chance of getting cervical cancer. If this is the case for you, your health care provider may recommend more frequent screening and Pap tests.  The human papillomavirus (HPV) test is another test that may be used for cervical cancer screening. The HPV test looks for the virus that can cause cell changes in the cervix. The cells collected during the Pap test can be tested for HPV.  The HPV test can be used to screen women 24 years of age and older. Getting tested for HPV  can extend the interval between normal Pap tests from three to five years.  An HPV test also should be used to screen women of any age who have unclear Pap test results.  After 67 years of age, women should have HPV testing as often as Pap tests.  Colorectal Cancer  This type of cancer can be detected and often prevented.  Routine colorectal cancer screening usually begins at 67 years of age and continues through 67 years of age.  Your health care provider may recommend screening at an earlier age if you have risk factors for colon cancer.  Your health care provider may also recommend using home test kits to check for hidden blood in the  stool.  A small camera at the end of a tube can be used to examine your colon directly (sigmoidoscopy or colonoscopy). This is done to check for the earliest forms of colorectal cancer.  Routine screening usually begins at age 74.  Direct examination of the colon should be repeated every 5-10 years through 67 years of age. However, you may need to be screened more often if early forms of precancerous polyps or small growths are found. Skin Cancer  Check your skin from head to toe regularly.  Tell your health care provider about any new moles or changes in moles, especially if there is a change in a mole's shape or color.  Also tell your health care provider if you have a mole that is larger than the size of a pencil eraser.  Always use sunscreen. Apply sunscreen liberally and repeatedly throughout the day.  Protect yourself by wearing long sleeves, pants, a wide-brimmed hat, and sunglasses whenever you are outside. HEART DISEASE, DIABETES, AND HIGH BLOOD PRESSURE   Have your blood pressure checked at least every 1-2 years. High blood pressure causes heart disease and increases the risk of stroke.  If you are between 81 years and 78 years old, ask your health care provider if you should take aspirin to prevent strokes.  Have regular diabetes  screenings. This involves taking a blood sample to check your fasting blood sugar level.  If you are at a normal weight and have a low risk for diabetes, have this test once every three years after 67 years of age.  If you are overweight and have a high risk for diabetes, consider being tested at a younger age or more often. PREVENTING INFECTION  Hepatitis B  If you have a higher risk for hepatitis B, you should be screened for this virus. You are considered at high risk for hepatitis B if:  You were born in a country where hepatitis B is common. Ask your health care provider which countries are considered high risk.  Your parents were born in a high-risk country, and you have not been immunized against hepatitis B (hepatitis B vaccine).  You have HIV or AIDS.  You use needles to inject street drugs.  You live with someone who has hepatitis B.  You have had sex with someone who has hepatitis B.  You get hemodialysis treatment.  You take certain medicines for conditions, including cancer, organ transplantation, and autoimmune conditions. Hepatitis C  Blood testing is recommended for:  Everyone born from [birth year] through 1963.  Anyone with known risk factors for hepatitis C. Sexually transmitted infections (STIs)  You should be screened for sexually transmitted infections (STIs) including gonorrhea and chlamydia if:  You are sexually active and are younger than 67 years of age.  You are older than 67 years of age and your health care provider tells you that you are at risk for this type of infection.  Your sexual activity has changed since you were last screened and you are at an increased risk for chlamydia or gonorrhea. Ask your health care provider if you are at risk.  If you do not have HIV, but are at risk, it may be recommended that you take a prescription medicine daily to prevent HIV infection. This is called pre-exposure prophylaxis (PrEP). You are considered at risk  if:  You are sexually active and do not regularly use condoms or know the HIV status of your partner(s).  You take drugs by injection.  You are  sexually active with a partner who has HIV. Talk with your health care provider about whether you are at high risk of being infected with HIV. If you choose to begin PrEP, you should first be tested for HIV. You should then be tested every 3 months for as long as you are taking PrEP.  PREGNANCY   If you are premenopausal and you may become pregnant, ask your health care provider about preconception counseling.  If you may become pregnant, take 400 to 800 micrograms (mcg) of folic acid every day.  If you want to prevent pregnancy, talk to your health care provider about birth control (contraception). OSTEOPOROSIS AND MENOPAUSE   Osteoporosis is a disease in which the bones lose minerals and strength with aging. This can result in serious bone fractures. Your risk for osteoporosis can be identified using a bone density scan.  If you are 16 years of age or older, or if you are at risk for osteoporosis and fractures, ask your health care provider if you should be screened.  Ask your health care provider whether you should take a calcium or vitamin D supplement to lower your risk for osteoporosis.  Menopause may have certain physical symptoms and risks.  Hormone replacement therapy may reduce some of these symptoms and risks. Talk to your health care provider about whether hormone replacement therapy is right for you.  HOME CARE INSTRUCTIONS   Schedule regular health, dental, and eye exams.  Stay current with your immunizations.   Do not use any tobacco products including cigarettes, chewing tobacco, or electronic cigarettes.  If you are pregnant, do not drink alcohol.  If you are breastfeeding, limit how much and how often you drink alcohol.  Limit alcohol intake to no more than 1 drink per day for nonpregnant women. One drink equals 12  ounces of beer, 5 ounces of wine, or 1 ounces of hard liquor.  Do not use street drugs.  Do not share needles.  Ask your health care provider for help if you need support or information about quitting drugs.  Tell your health care provider if you often feel depressed.  Tell your health care provider if you have ever been abused or do not feel safe at home. Document Released: 09/21/2010 Document Revised: 07/23/2013 Document Reviewed: 02/07/2013 [facility] Patient Information 2015 [place], [place]. This information is not intended to replace advice given to you by your health care provider. Make sure you discuss any questions you have with your health care provider.

## 2014-10-29 NOTE — Assessment & Plan Note (Signed)

## 2014-10-29 NOTE — Assessment & Plan Note (Signed)
She has had no recurrence in 4 yrs and has stopped Femara after 4 years due to miserable side effects.

## 2014-10-29 NOTE — Assessment & Plan Note (Addendum)
S/p left TKR in [place].  Rehab hindered from complete resolution of pain and stiffness due to IT band tightness .  All rehab managed by orthopedics.

## 2014-10-29 NOTE — Assessment & Plan Note (Addendum)
Presumed by ultrasound changes and serologies negative for autoimmune causes of hepatitis.  ultrasound noted a dilated common bile duct but no gallstones, and the appearance of  fatty liver.  Additional investigation was done with MRCP which was done Dec 2013.    No masses, gallstones,  or Intrahepatic ductal dilatation . She has not  received the Hepatitis A and B vaccines. Liver enzymes are pending

## 2014-10-30 ENCOUNTER — Encounter: Payer: Self-pay | Admitting: Internal Medicine

## 2014-11-06 ENCOUNTER — Other Ambulatory Visit (INDEPENDENT_AMBULATORY_CARE_PROVIDER_SITE_OTHER): Payer: Medicare Other

## 2014-11-06 DIAGNOSIS — R5383 Other fatigue: Secondary | ICD-10-CM

## 2014-11-06 LAB — COMPREHENSIVE METABOLIC PANEL
ALBUMIN: 4.4 g/dL (ref 3.5–5.2)
ALT: 27 U/L (ref 0–35)
AST: 21 U/L (ref 0–37)
Alkaline Phosphatase: 60 U/L (ref 39–117)
BUN: 20 mg/dL (ref 6–23)
CHLORIDE: 106 meq/L (ref 96–112)
CO2: 25 mEq/L (ref 19–32)
CREATININE: 0.69 mg/dL (ref 0.40–1.20)
Calcium: 9.6 mg/dL (ref 8.4–10.5)
GFR: 90.18 mL/min (ref >60.00)
Glucose, Bld: 108 mg/dL — ABNORMAL HIGH (ref 70–99)
Potassium: 3.9 mEq/L (ref 3.5–5.1)
SODIUM: 141 meq/L (ref 135–145)
Total Bilirubin: 0.6 mg/dL (ref 0.2–1.2)
Total Protein: 7 g/dL (ref 6.0–8.3)

## 2014-11-06 LAB — LIPID PANEL
CHOL/HDL RATIO: 4
CHOLESTEROL: 197 mg/dL (ref 0–200)
HDL: 52.5 mg/dL (ref >39.00)
LDL CALC: 117 mg/dL — AB (ref 0–99)
NONHDL: 144.52
Triglycerides: 138 mg/dL (ref 0.0–149.0)
VLDL: 27.6 mg/dL (ref 0.0–40.0)

## 2014-11-06 LAB — CBC WITH DIFFERENTIAL/PLATELET
BASOS ABS: 0 10*3/uL (ref 0.0–0.1)
Basophils Relative: 1 % (ref 0.0–3.0)
EOS ABS: 0.3 10*3/uL (ref 0.0–0.7)
Eosinophils Relative: 8.4 % — ABNORMAL HIGH (ref 0.0–5.0)
HCT: 40.7 % (ref 36.0–46.0)
Hemoglobin: 14 g/dL (ref 12.0–15.0)
LYMPHS ABS: 1.4 10*3/uL (ref 0.7–4.0)
Lymphocytes Relative: 39.4 % (ref 12.0–46.0)
MCHC: 34.4 g/dL (ref 30.0–36.0)
MCV: 95.8 fl (ref 78.0–100.0)
MONO ABS: 0.4 10*3/uL (ref 0.1–1.0)
MONOS PCT: 11.1 % (ref 3.0–12.0)
NEUTROS PCT: 40.1 % — AB (ref 43.0–77.0)
Neutro Abs: 1.4 10*3/uL (ref 1.4–7.7)
Platelets: 226 10*3/uL (ref 150.0–400.0)
RBC: 4.25 Mil/uL (ref 3.87–5.11)
RDW: 13.7 % (ref 11.5–15.5)
WBC: 3.5 10*3/uL — ABNORMAL LOW (ref 4.0–10.5)

## 2014-11-06 LAB — VITAMIN D 25 HYDROXY (VIT D DEFICIENCY, FRACTURES): VITD: 33.39 ng/mL (ref 30.00–100.00)

## 2014-11-06 LAB — TSH: TSH: 1.98 u[IU]/mL (ref 0.35–4.50)

## 2014-11-07 LAB — HEPATITIS C ANTIBODY: HCV AB: NEGATIVE

## 2014-11-08 ENCOUNTER — Other Ambulatory Visit: Payer: Self-pay | Admitting: Internal Medicine

## 2014-11-08 ENCOUNTER — Encounter: Payer: Self-pay | Admitting: Internal Medicine

## 2014-11-08 DIAGNOSIS — Z113 Encounter for screening for infections with a predominantly sexual mode of transmission: Secondary | ICD-10-CM

## 2014-11-08 DIAGNOSIS — R7301 Impaired fasting glucose: Secondary | ICD-10-CM

## 2014-11-08 DIAGNOSIS — E559 Vitamin D deficiency, unspecified: Secondary | ICD-10-CM

## 2015-04-24 ENCOUNTER — Ambulatory Visit
Admission: EM | Admit: 2015-04-24 | Discharge: 2015-04-24 | Disposition: A | Discharge status: Home / Self Care | Payer: Medicare Other | Attending: Family Medicine | Admitting: Family Medicine

## 2015-04-24 DIAGNOSIS — J0100 Acute maxillary sinusitis, unspecified: Secondary | ICD-10-CM

## 2015-04-24 MED ORDER — AZITHROMYCIN 250 MG PO TABS: ORAL_TABLET | ORAL | Status: DC

## 2015-04-24 NOTE — ED Provider Notes (Signed)
CSN: [id number]     Arrival date & time 04/24/15  [id number] History   First MD Initiated Contact with Patient 04/24/15 [phone]     Chief Complaint  Patient presents with  . URI   (Consider location/radiation/quality/duration/timing/severity/associated sxs/prior Treatment) HPI   This 70 year old female presents with a two-week history of cold symptoms that she has been treating symptomatically . 2 days ago began to have bilateral ear pressure worse on the left. She states that usually her cold symptoms will abate after a few days but this has persisted. Last night while eating a piece of calimari, noticed increased pain front of her left ear. Hurts now with chewing,  Past Medical History  Diagnosis Date  . Atrial fibrillation ([place]) 2010    Followed by Dr. Jordan Hawks  . Mitral regurgitation     and prolapse  . Actinic keratosis     managed by Darlis Loan  . Osteopenia 56    Stable by repeat scans  . Breast cyst 2003    aspirated, fibroystics breasts with stable calcifications on left breast  . Leukopenia   . breast cancer 2010    s/p mastectomy   Past Surgical History  Procedure Laterality Date  . Mastectomy  02/2009    Bilateral  . Cesarean section      x 2  . Shoulder surgery      left rotator cuff  . Knee arthroscopy w/ meniscectomy  1997  . Atrial ablation surgery  Dec 2012    [facility]  . Total knee arthroplasty  02/2014    Left knee  . Mastectomy      Right and Left   Family History  Problem Relation Age of Onset  . COPD Mother   . Hypertension Father   . Hepatitis Sister     Hepatitis C from a dental procedure   Social History  Substance Use Topics  . Smoking status: Never Smoker   . Smokeless tobacco: Never Used  . Alcohol Use: Yes     Comment: Moderate   OB History    No data available     Review of Systems  Constitutional: Negative for fever, chills, activity change and fatigue.  HENT: Positive for congestion, ear pain, hearing loss, postnasal drip, rhinorrhea  and sinus pressure.   Respiratory: Negative for cough.   All other systems reviewed and are negative.   Allergies  Penicillins and Cephalexin  Home Medications   Prior to Admission medications   Medication Sig Start Date End Date Taking? Authorizing Provider  aspirin 81 MG tablet Take 81 mg by mouth 2 (two) times daily.   Yes Historical Provider, MD  Calcium Carbonate-Vitamin D (CALCIUM-VITAMIN D) 500-200 MG-UNIT per tablet Take 1 tablet by mouth 2 (two) times daily with a meal.     Yes Historical Provider, MD  cetirizine (ZYRTEC) 10 MG tablet Take 10 mg by mouth daily.     Yes Historical Provider, MD  hydrochlorothiazide 25 MG tablet Take 25 mg by mouth daily.     Yes Historical Provider, MD  metoprolol succinate (TOPROL-XL) 25 MG 24 hr tablet Take 25 mg by mouth daily.   Yes Historical Provider, MD  Multiple Vitamin (MULTIVITAMIN) tablet Take 1 tablet by mouth daily.     Yes Historical Provider, MD  azithromycin (ZITHROMAX Z-PAK) 250 MG tablet Use as per package instructions 04/24/15   Lorin Picket, PA-C  Doxylamine Succinate, Sleep, (SLEEP AID PO) Take by mouth.    Historical Provider, MD  Tdap (  BOOSTRIX) 5-2.5-18.5 LF-MCG/0.5 injection Inject 0.5 mLs into the muscle once. 09/20/13   Crecencio Mc, MD   Meds Ordered and Administered this Visit  Medications - No data to display  BP 136/81 mmHg  Pulse 84  Temp(Src) 97.7 F (36.5 C) (Tympanic)  Resp 16  Ht 5\' 7"  (1.702 m)  Wt 218 lb (98.884 kg)  BMI 34.14 kg/m2  SpO2 98% No data found.   Physical Exam  Constitutional: She is oriented to person, place, and time. She appears well-developed and well-nourished. No distress.  HENT:  Head: Normocephalic and atraumatic.  Right Ear: External ear normal.  Left Ear: External ear normal.  Nose: Nose normal.  Mouth/Throat: Oropharynx is clear and moist. No oropharyngeal exudate.  Patient has significant tenderness to percussion over the maxillary sinuses but not over the frontal. 8  right ear TM is partially occluded by cerumen.  Eyes: Conjunctivae are normal. Pupils are equal, round, and reactive to light.  Neck: Normal range of motion. Neck supple.  Pulmonary/Chest: Breath sounds normal. No respiratory distress. She has no wheezes. She has no rales.  Musculoskeletal: Normal range of motion. She exhibits no edema or tenderness.  Lymphadenopathy:    She has no cervical adenopathy.  Neurological: She is alert and oriented to person, place, and time.  Skin: Skin is warm and dry. She is not diaphoretic.  Psychiatric: She has a normal mood and affect. Her behavior is normal. Judgment and thought content normal.  Nursing note and vitals reviewed.   ED Course  Procedures (including critical care time)  Labs Review Labs Reviewed - No data to display  Imaging Review No results found.   Visual Acuity Review  Right Eye Distance:   Left Eye Distance:   Bilateral Distance:    Right Eye Near:   Left Eye Near:    Bilateral Near:         MDM   1. Acute maxillary sinusitis, recurrence not specified    Discharge Medication List as of 04/24/2015  9:15 AM    START taking these medications   Details  azithromycin (ZITHROMAX Z-PAK) 250 MG tablet Use as per package instructions, Normal      Plan: 1. Diagnosis reviewed with patient 2. rx as per orders; risks, benefits, potential side effects reviewed with patient 3. Recommend supportive treatment with rest and fluids. Initiate Flonase; start her on a Z-Pak for sinusitis since she is allergic to Keflex and penicillin. 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 In meantime she can take anti-inflammatories which may help somewhat. She continues to have problems ,should follow-up with her primary care physician. 4. F/u prn if symptoms worsen or don't improve     Lorin Picket, PA-C 04/24/15 2030

## 2015-04-24 NOTE — ED Notes (Signed)
Started about 2 weeks ago with cold symptoms. 2 days ago started with bilateral ear pressure, left worse than right

## 2015-04-24 NOTE — Discharge Instructions (Signed)
Sinusitis, Adult  Sinusitis is redness, soreness, and inflammation of the paranasal sinuses. Paranasal sinuses are air pockets within the bones of your face. They are located beneath your eyes, in the middle of your forehead, and above your eyes. In healthy paranasal sinuses, mucus is able to drain out, and air is able to circulate through them by way of your nose. However, when your paranasal sinuses are inflamed, mucus and air can become trapped. This can allow bacteria and other germs to grow and cause infection.  Sinusitis can develop quickly and last only a short time (acute) or continue over a long period (chronic). Sinusitis that lasts for more than 12 weeks is considered chronic.  CAUSES  Causes of sinusitis include:  · Allergies.  · Structural abnormalities, such as displacement of the cartilage that separates your nostrils (deviated septum), which can decrease the air flow through your nose and sinuses and affect sinus drainage.  · Functional abnormalities, such as when the small hairs (cilia) that line your sinuses and help remove mucus do not work properly or are not present.  SIGNS AND SYMPTOMS  Symptoms of acute and chronic sinusitis are the same. The primary symptoms are pain and pressure around the affected sinuses. Other symptoms include:  · Upper toothache.  · Earache.  · Headache.  · Bad breath.  · Decreased sense of smell and taste.  · A cough, which worsens when you are lying flat.  · Fatigue.  · Fever.  · Thick drainage from your nose, which often is green and may contain pus (purulent).  · Swelling and warmth over the affected sinuses.  DIAGNOSIS  Your health care provider will perform a physical exam. During your exam, your health care provider may perform any of the following to help determine if you have acute sinusitis or chronic sinusitis:  · Look in your nose for signs of abnormal growths in your nostrils (nasal polyps).  · Tap over the affected sinus to check for signs of  infection.  · View the inside of your sinuses using an imaging device that has a light attached (endoscope).  If your health care provider suspects that you have chronic sinusitis, one or more of the following tests may be recommended:  · Allergy tests.  · Nasal culture. A sample of mucus is taken from your nose, sent to a lab, and screened for bacteria.  · Nasal cytology. A sample of mucus is taken from your nose and examined by your health care provider to determine if your sinusitis is related to an allergy.  TREATMENT  Most cases of acute sinusitis are related to a viral infection and will resolve on their own within 10 days. Sometimes, medicines are prescribed to help relieve symptoms of both acute and chronic sinusitis. These may include pain medicines, decongestants, nasal steroid sprays, or saline sprays.  However, for sinusitis related to a bacterial infection, your health care provider will prescribe antibiotic medicines. These are medicines that will help kill the bacteria causing the infection.  Rarely, sinusitis is caused by a fungal infection. In these cases, your health care provider will prescribe antifungal medicine.  For some cases of chronic sinusitis, surgery is needed. Generally, these are cases in which sinusitis recurs more than 3 times per year, despite other treatments.  HOME CARE INSTRUCTIONS  · Drink plenty of water. Water helps thin the mucus so your sinuses can drain more easily.  · Use a humidifier.  · Inhale steam 3-4 times a day (for   example, sit in the bathroom with the shower running).  · Apply a warm, moist washcloth to your face 3-4 times a day, or as directed by your health care provider.  · Use saline nasal sprays to help moisten and clean your sinuses.  · Take medicines only as directed by your health care provider.  · If you were prescribed either an antibiotic or antifungal medicine, finish it all even if you start to feel better.  SEEK IMMEDIATE MEDICAL CARE IF:  · You have  increasing pain or severe headaches.  · You have nausea, vomiting, or drowsiness.  · You have swelling around your face.  · You have vision problems.  · You have a stiff neck.  · You have difficulty breathing.     This information is not intended to replace advice given to you by your health care provider. Make sure you discuss any questions you have with your health care provider.     Document Released: 03/08/2005 Document Revised: 03/29/2014 Document Reviewed: 03/23/2011  Elsevier Interactive Patient Education ©2016 Elsevier Inc[employer]  Sinus Rinse  WHAT IS A SINUS RINSE?  A sinus rinse is a simple home treatment that is used to rinse your sinuses with a sterile mixture of salt and water (saline solution). Sinuses are air-filled spaces in your skull behind the bones of your face and forehead that open into your nasal cavity.  You will use the following:  · Saline solution.  · Neti pot or spray bottle. This releases the saline solution into your nose and through your sinuses. Neti pots and spray bottles can be purchased at your local pharmacy, a health food store, or online.  WHEN WOULD I DO A SINUS RINSE?  A sinus rinse can help to clear mucus, dirt, dust, or pollen from the nasal cavity. You may do a sinus rinse when you have a cold, a virus, nasal allergy symptoms, a sinus infection, or stuffiness in the nose or sinuses.  If you are considering a sinus rinse:  · Ask your child's health care provider before performing a sinus rinse on your child.  · Do not do a sinus rinse if you have had ear or nasal surgery, ear infection, or blocked ears.  HOW DO I DO A SINUS RINSE?  · Wash your hands.  · Disinfect your device according to the directions provided and then dry it.  · Use the solution that comes with your device or one that is sold separately in stores. Follow the mixing directions on the package.  · Fill your device with the amount of saline solution as directed by the device instructions.  · Stand over a sink and  tilt your head sideways over the sink.  · Place the spout of the device in your upper nostril (the one closer to the ceiling).  · Gently pour or squeeze the saline solution into the nasal cavity. The liquid should drain to the lower nostril if you are not overly congested.  · Gently blow your nose. Blowing too hard may cause ear pain.  · Repeat in the other nostril.  · Clean and rinse your device with clean water and then air-dry it.  ARE THERE RISKS OF A SINUS RINSE?   Sinus rinse is generally very safe and effective. However, there are a few risks, which include:   · A burning sensation in the sinuses. This may happen if you do not make the saline solution as directed. Make sure to follow all directions when   making the saline solution.  · Infection from contaminated water. This is rare, but possible.  · Nasal irritation.     This information is not intended to replace advice given to you by your health care provider. Make sure you discuss any questions you have with your health care provider.     Document Released: 10/03/2013 Document Reviewed: 10/03/2013  Elsevier Interactive Patient Education ©2016 Elsevier Inc.

## 2015-07-02 DIAGNOSIS — C50912 Malignant neoplasm of unspecified site of left female breast: Secondary | ICD-10-CM | POA: Insufficient documentation

## 2015-07-15 DIAGNOSIS — R0602 Shortness of breath: Secondary | ICD-10-CM | POA: Insufficient documentation

## 2015-08-01 ENCOUNTER — Ambulatory Visit (INDEPENDENT_AMBULATORY_CARE_PROVIDER_SITE_OTHER): Payer: Medicare Other | Admitting: Internal Medicine

## 2015-08-01 ENCOUNTER — Encounter: Payer: Self-pay | Admitting: Internal Medicine

## 2015-08-01 VITALS — BP 120/80 | HR 90 | Ht 67.0 in | Wt 224.0 lb

## 2015-08-01 DIAGNOSIS — R5383 Other fatigue: Secondary | ICD-10-CM

## 2015-08-01 DIAGNOSIS — F320 Major depressive disorder, single episode, mild: Secondary | ICD-10-CM

## 2015-08-01 DIAGNOSIS — K5901 Slow transit constipation: Secondary | ICD-10-CM | POA: Diagnosis not present

## 2015-08-01 DIAGNOSIS — E669 Obesity, unspecified: Secondary | ICD-10-CM

## 2015-08-01 DIAGNOSIS — I890 Lymphedema, not elsewhere classified: Secondary | ICD-10-CM

## 2015-08-01 LAB — MAGNESIUM: MAGNESIUM: 1.8 mg/dL (ref 1.5–2.5)

## 2015-08-01 LAB — VITAMIN B12: Vitamin B-12: 569 pg/mL (ref 200–1100)

## 2015-08-01 MED ORDER — BUPROPION HCL ER (SR) 100 MG PO TB12: 100.0000 mg | ORAL_TABLET | Freq: Two times a day (BID) | ORAL | Status: DC

## 2015-08-01 NOTE — Progress Notes (Signed)
Subjective:  Patient ID: Heidi Walsh, female    DOB: [birth year]  Age: 70 y.o. MRN: [id number]  CC: The primary encounter diagnosis was Slow transit constipation. Diagnoses of Other fatigue, Major depressive disorder, single episode, mild ([place]), Obesity (BMI 30-39.9), and Lymphedema were also pertinent to this visit.  HPI Heidi Walsh presents for follow up on obesity and depression  Wt gain of 7 lb since August 2016 visit  Depression: worsening secondary to health problems related to lymphedema and resistant cellulitis of the left arm.    Left arm developed lymphedema in February . Was referred to  PT and lymphedema specialist by Dr Albertina Parr, doppler was negative but had cellulitis  That has been heard to resolve. Multiple antibiotic trials to resolve the cellulitis.cryptoccal antigen study was negative .   Was using Aleve to manage her joint pain but she had to stop taking Aleve bc they needed to follow her fever curve.   Saw Fath:  ECHO and nuc med study normal   Depression: She wants therapy byt doesn't want to take meds that weill gain weight.  Doing yoga ,  , water aerobics and walking.    She adheres to her diet without any trouble until  4 pm  Daily ,  Has  Trouble from 4 pm to 7 pm.  Snack is fruit, .jicama and celery sticks with a tsp of almond butter. dinner was salmon and salad. Cookie for dessert.   Drinks diet green tea or water breakfast is eggs (1-2) with a piece of seeded bread and cashew butter or laughing cow spreadable cheese    Feels that portion control is her problem.      New development of constipation   Using 2 laxatives daily doesn't know which one.[name] is terrible,  Despite drinking 8 glasses of water daily      Outpatient Prescriptions Prior to Visit  Medication Sig Dispense Refill  . aspirin 81 MG tablet Take 81 mg by mouth 2 (two) times daily.    . Calcium Carbonate-Vitamin D (CALCIUM-VITAMIN D) 500-200 MG-UNIT per tablet Take 1 tablet by mouth 2  (two) times daily with a meal.      . cetirizine (ZYRTEC) 10 MG tablet Take 10 mg by mouth daily.      . Doxylamine Succinate, Sleep, (SLEEP AID PO) Take by mouth.    . hydrochlorothiazide 25 MG tablet Take 25 mg by mouth daily.      . metoprolol succinate (TOPROL-XL) 25 MG 24 hr tablet Take 25 mg by mouth daily.    . Multiple Vitamin (MULTIVITAMIN) tablet Take 1 tablet by mouth daily.      . Tdap (BOOSTRIX) 5-2.5-18.5 LF-MCG/0.5 injection Inject 0.5 mLs into the muscle once. 0.5 mL 0  . azithromycin (ZITHROMAX Z-PAK) 250 MG tablet Use as per package instructions (Patient not taking: Reported on 08/01/2015) 1 each 0   No facility-administered medications prior to visit.    Review of Systems;  Patient denies headache, fevers, malaise, unintentional weight loss, skin rash, eye pain, sinus congestion and sinus pain, sore throat, dysphagia,  hemoptysis , cough, dyspnea, wheezing, chest pain, palpitations, orthopnea, edema, abdominal pain, nausea, melena, diarrhea, constipation, flank pain, dysuria, hematuria, urinary  Frequency, nocturia, numbness, tingling, seizures,  Focal weakness, Loss of consciousness,  Tremor, insomnia, depression, anxiety, and suicidal ideation.      Objective:  BP 120/80 mmHg  Pulse 90  Ht 5\' 7"  (1.702 m)  Wt 224 lb (101.606 kg)  BMI 35.08  kg/m2  BP Readings from Last 3 Encounters:  08/01/15 120/80  04/24/15 136/81  10/28/14 118/80    Wt Readings from Last 3 Encounters:  08/01/15 224 lb (101.606 kg)  04/24/15 218 lb (98.884 kg)  10/28/14 217 lb 6.4 oz (98.612 kg)    General appearance: alert, cooperative and appears stated age Ears: normal TM's and external ear canals both ears Throat: lips, mucosa, and tongue normal; teeth and gums normal Neck: no adenopathy, no carotid bruit, supple, symmetrical, trachea midline and thyroid not enlarged, symmetric, no tenderness/mass/nodules Back: symmetric, no curvature. ROM normal. No CVA tenderness. Lungs: clear to  auscultation bilaterally Heart: regular rate and rhythm, S1, S2 normal, no murmur, click, rub or gallop Abdomen: soft, non-tender; bowel sounds normal; no masses,  no organomegaly Pulses: 2+ and symmetric Skin: Skin color, texture, turgor normal. No rashes or lesions Lymph nodes: Cervical, supraclavicular, and axillary nodes normal.  Lab Results  Component Value Date   HGBA1C 5.9 01/11/2013    Lab Results  Component Value Date   CREATININE 0.69 11/06/2014   CREATININE 0.7 01/11/2013   CREATININE 0.8 05/30/2012    Lab Results  Component Value Date   WBC 3.5* 11/06/2014   HGB 14.0 11/06/2014   HCT 40.7 11/06/2014   PLT 226.0 11/06/2014   GLUCOSE 108* 11/06/2014   CHOL 197 11/06/2014   TRIG 138.0 11/06/2014   HDL 52.50 11/06/2014   LDLDIRECT 146.4 01/11/2013   LDLCALC 117* 11/06/2014   ALT 27 11/06/2014   AST 21 11/06/2014   NA 141 11/06/2014   K 3.9 11/06/2014   CL 106 11/06/2014   CREATININE 0.69 11/06/2014   BUN 20 11/06/2014   CO2 25 11/06/2014   TSH 2.050 08/01/2015   HGBA1C 5.9 01/11/2013    No results found.  Assessment & Plan:   Problem List Items Addressed This Visit    Obesity (BMI 30-39.9)    I have addressed  BMI and recommended a low glycemic index diet utilizing smaller more frequent meals to increase metabolism.  I have also recommended that patient start exercising with a goal of 30 minutes of aerobic exercise a minimum of 5 days per week.       Major depressive disorder, single episode    With lack of motivation, fatigue, and depressed mood. Trial of wellbutrin      Relevant Medications   buPROPion (WELLBUTRIN SR) 100 MG 12 hr tablet   Lymphedema    Left arm, complicated by resistant cellulitis,  Treated by [facility]  ID        Other Visit Diagnoses    Slow transit constipation    -  Primary    Relevant Orders    T4 AND TSH (Completed)    Magnesium (Completed)    Other fatigue        Relevant Orders    B12 (Completed)      A total of 25  minutes was spent with patient more than half of which was spent in counseling patient on the above mentioned issues , reviewing and explaining recent labs and imaging studies done, and coordination of care.  I have discontinued Ms. Strum's azithromycin. I am also having her start on buPROPion. Additionally, I am having her maintain her hydrochlorothiazide, cetirizine, multivitamin, calcium-vitamin D, (Doxylamine Succinate, Sleep, (SLEEP AID PO)), aspirin, metoprolol succinate, and Tdap.  Meds ordered this encounter  Medications  . buPROPion (WELLBUTRIN SR) 100 MG 12 hr tablet    Sig: Take 1 tablet (100 mg total) by  mouth 2 (two) times daily.    Dispense:  60 tablet    Refill:  1    Medications Discontinued During This Encounter  Medication Reason  . azithromycin (ZITHROMAX Z-PAK) 250 MG tablet Completed Course    Follow-up: Return in about 4 weeks (around 08/29/2015).   Crecencio Mc, MD

## 2015-08-01 NOTE — Patient Instructions (Addendum)
Wellbutrin  One tablet twice daily for your depression.. Take your second dose by 2 pm to avoid insomnia    The  diet I discussed with you today is the 10 day Green Smoothie Cleansing /Detox Diet by Linden Dolin . available on [place] for around $10.  It does require a blender, (Vita Mix, a electric juicer,  Or a Nutribullet Rx).  This is not a low carb or a weight loss diet,  It is fundamentally a "cleansing" low fat diet that eliminates sugar, gluten, caffeine, alcohol and dairy for 10 days .  What you add back after the initial ten days is entirely up to  you!  You can expect to lose 5 to 10 lbs depending on how strict you are.   I found that  drinking 2 smoothies or juices  daily and keeping one chewable meal (but keep it simple, like baked fish and salad, rice or bok choy) kept me satisfied and kept me from straying  .  You snack primarily on fresh  fruit, egg whites and judicious quantities of nuts.  You can add a  vegetable based protein powder  to any smoothie made with almond milk (nothing with whey , since whey is dairy)  [employer] has a few but  the [employer] has the greatest  selection .  Using frozen fruits is much more convenient and cost effective. You can even find plenty of organic fruit in the frozen fruit section of [employer].  Just thaw what you need for the following day the night before in the refrigerator (to avoid jamming up your machine)   The organic vegan protein powder I tried  is called Vega" and I found it at [employer] .  It is sugar free. Tastes like crap.  My advice:  Sarina Ser your protein  (eat an egg or two in the am with your smoothie or add soy yogurt for protein ) ,  Don't ruin the taste of your smoothies with protein powder unless you can find one you really love.

## 2015-08-02 LAB — T4 AND TSH
T4 TOTAL: 6.2 ug/dL (ref 4.5–12.0)
TSH: 2.05 u[IU]/mL (ref 0.450–4.500)

## 2015-08-03 ENCOUNTER — Encounter: Payer: Self-pay | Admitting: Internal Medicine

## 2015-08-03 DIAGNOSIS — F320 Major depressive disorder, single episode, mild: Secondary | ICD-10-CM | POA: Insufficient documentation

## 2015-08-03 DIAGNOSIS — I890 Lymphedema, not elsewhere classified: Secondary | ICD-10-CM | POA: Insufficient documentation

## 2015-08-03 DIAGNOSIS — F329 Major depressive disorder, single episode, unspecified: Secondary | ICD-10-CM

## 2015-08-03 NOTE — Assessment & Plan Note (Signed)
I have addressed  BMI and recommended a low glycemic index diet utilizing smaller more frequent meals to increase metabolism.  I have also recommended that patient start exercising with a goal of 30 minutes of aerobic exercise a minimum of 5 days per week.  

## 2015-08-03 NOTE — Assessment & Plan Note (Signed)
With lack of motivation, fatigue, and depressed mood. Trial of wellbutrin

## 2015-08-03 NOTE — Assessment & Plan Note (Signed)
Left arm, complicated by resistant cellulitis,  Treated by [facility]  ID

## 2015-08-14 ENCOUNTER — Encounter: Payer: Self-pay | Admitting: Internal Medicine

## 2015-08-15 ENCOUNTER — Other Ambulatory Visit: Payer: Self-pay | Admitting: Internal Medicine

## 2015-08-15 MED ORDER — LACTULOSE 20 GM/30ML PO SOLN: ORAL | Status: DC

## 2015-08-20 ENCOUNTER — Telehealth: Payer: Self-pay | Admitting: *Deleted

## 2015-08-20 ENCOUNTER — Encounter: Payer: Self-pay | Admitting: Emergency Medicine

## 2015-08-20 ENCOUNTER — Emergency Department: Payer: Medicare Other

## 2015-08-20 ENCOUNTER — Emergency Department
Admission: EM | Admit: 2015-08-20 | Discharge: 2015-08-20 | Disposition: A | Discharge status: Home / Self Care | Payer: Medicare Other | Attending: Emergency Medicine | Admitting: Emergency Medicine

## 2015-08-20 DIAGNOSIS — Z853 Personal history of malignant neoplasm of breast: Secondary | ICD-10-CM | POA: Insufficient documentation

## 2015-08-20 DIAGNOSIS — K5901 Slow transit constipation: Secondary | ICD-10-CM

## 2015-08-20 DIAGNOSIS — R110 Nausea: Secondary | ICD-10-CM

## 2015-08-20 DIAGNOSIS — R1084 Generalized abdominal pain: Secondary | ICD-10-CM

## 2015-08-20 DIAGNOSIS — Z792 Long term (current) use of antibiotics: Secondary | ICD-10-CM | POA: Diagnosis not present

## 2015-08-20 DIAGNOSIS — F329 Major depressive disorder, single episode, unspecified: Secondary | ICD-10-CM | POA: Insufficient documentation

## 2015-08-20 DIAGNOSIS — Z7982 Long term (current) use of aspirin: Secondary | ICD-10-CM | POA: Insufficient documentation

## 2015-08-20 DIAGNOSIS — I4891 Unspecified atrial fibrillation: Secondary | ICD-10-CM | POA: Diagnosis not present

## 2015-08-20 DIAGNOSIS — R42 Dizziness and giddiness: Secondary | ICD-10-CM | POA: Diagnosis present

## 2015-08-20 DIAGNOSIS — Z79899 Other long term (current) drug therapy: Secondary | ICD-10-CM | POA: Diagnosis not present

## 2015-08-20 LAB — COMPREHENSIVE METABOLIC PANEL
ALT: 39 U/L (ref 14–54)
ANION GAP: 12 (ref 5–15)
AST: 28 U/L (ref 15–41)
Albumin: 4.9 g/dL (ref 3.5–5.0)
Alkaline Phosphatase: 64 U/L (ref 38–126)
BUN: 13 mg/dL (ref 6–20)
CHLORIDE: 102 mmol/L (ref 101–111)
CO2: 24 mmol/L (ref 22–32)
Calcium: 9.7 mg/dL (ref 8.9–10.3)
Creatinine, Ser: 0.74 mg/dL (ref 0.44–1.00)
Glucose, Bld: 97 mg/dL (ref 65–99)
POTASSIUM: 3.3 mmol/L — AB (ref 3.5–5.1)
Sodium: 138 mmol/L (ref 135–145)
Total Bilirubin: 0.6 mg/dL (ref 0.3–1.2)
Total Protein: 7.7 g/dL (ref 6.5–8.1)

## 2015-08-20 LAB — URINALYSIS COMPLETE WITH MICROSCOPIC (ARMC ONLY)
Bilirubin Urine: NEGATIVE
Glucose, UA: NEGATIVE mg/dL
HGB URINE DIPSTICK: NEGATIVE
Ketones, ur: NEGATIVE mg/dL
LEUKOCYTES UA: NEGATIVE
NITRITE: NEGATIVE
PH: 7 (ref 5.0–8.0)
PROTEIN: NEGATIVE mg/dL
Specific Gravity, Urine: 1.006 (ref 1.005–1.030)

## 2015-08-20 LAB — CBC
HEMATOCRIT: 42.7 % (ref 35.0–47.0)
HEMOGLOBIN: 15 g/dL (ref 12.0–16.0)
MCH: 32.9 pg (ref 26.0–34.0)
MCHC: 35.1 g/dL (ref 32.0–36.0)
MCV: 93.9 fL (ref 80.0–100.0)
Platelets: 188 10*3/uL (ref 150–440)
RBC: 4.54 MIL/uL (ref 3.80–5.20)
RDW: 13.1 % (ref 11.5–14.5)
WBC: 4.7 10*3/uL (ref 3.6–11.0)

## 2015-08-20 LAB — TROPONIN I: Troponin I: <0.03 ng/mL (ref <0.031)

## 2015-08-20 LAB — LIPASE, BLOOD: LIPASE: 32 U/L (ref 11–51)

## 2015-08-20 MED ORDER — ONDANSETRON HCL 4 MG PO TABS: 4.0000 mg | ORAL_TABLET | Freq: Every day | ORAL | Status: DC | PRN

## 2015-08-20 MED ORDER — POLYETHYLENE GLYCOL 3350 17 G PO PACK: 17.0000 g | PACK | Freq: Every day | ORAL | Status: DC

## 2015-08-20 MED ORDER — ONDANSETRON 4 MG PO TBDP
4.0000 mg | ORAL_TABLET | Freq: Once | ORAL | Status: AC
  Administered 2015-08-20: 4 mg via ORAL
  Filled 2015-08-20: qty 1

## 2015-08-20 NOTE — Telephone Encounter (Signed)
Patient is having acute abdominal pain. She stated that this happened last night. She currently has light pain but not acute Last night along with the pain, she had sweats, she felt as if she passed a stone. She requested a phone consult, and a appt if needed. [phone]

## 2015-08-20 NOTE — Discharge Instructions (Signed)
Constipation, Adult Constipation is when a person has fewer than three bowel movements a week, has difficulty having a bowel movement, or has stools that are dry, hard, or larger than normal. As people grow older, constipation is more common. A low-fiber diet, not taking in enough fluids, and taking certain medicines may make constipation worse.  CAUSES   Certain medicines, such as antidepressants, pain medicine, iron supplements, antacids, and water pills.   Certain diseases, such as diabetes, irritable bowel syndrome (IBS), thyroid disease, or depression.   Not drinking enough water.   Not eating enough fiber-rich foods.   Stress or travel.   Lack of physical activity or exercise.   Ignoring the urge to have a bowel movement.   Using laxatives too much.  SIGNS AND SYMPTOMS   Having fewer than three bowel movements a week.   Straining to have a bowel movement.   Having stools that are hard, dry, or larger than normal.   Feeling full or bloated.   Pain in the lower abdomen.   Not feeling relief after having a bowel movement.  DIAGNOSIS  Your health care provider will take a medical history and perform a physical exam. Further testing may be done for severe constipation. Some tests may include:  A barium enema X-ray to examine your rectum, colon, and, sometimes, your small intestine.   A sigmoidoscopy to examine your lower colon.   A colonoscopy to examine your entire colon. TREATMENT  Treatment will depend on the severity of your constipation and what is causing it. Some dietary treatments include drinking more fluids and eating more fiber-rich foods. Lifestyle treatments may include regular exercise. If these diet and lifestyle recommendations do not help, your health care provider may recommend taking over-the-counter laxative medicines to help you have bowel movements. Prescription medicines may be prescribed if over-the-counter medicines do not work.    HOME CARE INSTRUCTIONS   Eat foods that have a lot of fiber, such as fruits, vegetables, whole grains, and beans.  Limit foods high in fat and processed sugars, such as french fries, hamburgers, cookies, candies, and soda.   A fiber supplement may be added to your diet if you cannot get enough fiber from foods.   Drink enough fluids to keep your urine clear or pale yellow.   Exercise regularly or as directed by your health care provider.   Go to the restroom when you have the urge to go. Do not hold it.   Only take over-the-counter or prescription medicines as directed by your health care provider. Do not take other medicines for constipation without talking to your health care provider first.  [place] IF:   You have bright red blood in your stool.   Your constipation lasts for more than 4 days or gets worse.   You have abdominal or rectal pain.   You have thin, pencil-like stools.   You have unexplained weight loss. MAKE SURE YOU:   Understand these instructions.  Will watch your condition.  Will get help right away if you are not doing well or get worse.   This information is not intended to replace advice given to you by your health care provider. Make sure you discuss any questions you have with your health care provider.   Document Released: 12/05/2003 Document Revised: 03/29/2014 Document Reviewed: 12/18/2012 Elsevier Interactive Patient Education 2016 Else[employer] Inc.  Nausea, Adult Nausea is the feeling that you have an upset stomach or have to vomit. Nausea by itself  is not likely a serious concern, but it may be an early sign of more serious medical problems. As nausea gets worse, it can lead to vomiting. If vomiting develops, there is the risk of dehydration.  CAUSES   Viral infections.  Food poisoning.  Medicines.  Pregnancy.  Motion sickness.  Migraine headaches.  Emotional distress.  Severe pain from any  source.  Alcohol intoxication. HOME CARE INSTRUCTIONS  Get plenty of rest.  Ask your caregiver about specific rehydration instructions.  Eat small amounts of food and sip liquids more often.  Take all medicines as told by your caregiver. SEEK MEDICAL CARE IF:  You have not improved after 2 days, or you get worse.  You have a headache. SEEK IMMEDIATE MEDICAL CARE IF:   You have a fever.  You faint.  You keep vomiting or have blood in your vomit.  You are extremely weak or dehydrated.  You have dark or bloody stools.  You have severe chest or abdominal pain. MAKE SURE YOU:  Understand these instructions.  Will watch your condition.  Will get help right away if you are not doing well or get worse.   This information is not intended to replace advice given to you by your health care provider. Make sure you discuss any questions you have with your health care provider.   Document Released: 04/15/2004 Document Revised: 03/29/2014 Document Reviewed: 11/18/2010 Elsevier Interactive Patient Education [employer].

## 2015-08-20 NOTE — ED Notes (Signed)
Report given to Lauryn, RN.  

## 2015-08-20 NOTE — ED Provider Notes (Signed)
[facility] Emergency Department Provider Note        Time seen: ----------------------------------------- 1:57 PM on 08/20/2015 -----------------------------------------    I have reviewed the triage vital signs and the nursing notes.   HISTORY  Chief Complaint Abdominal Pain; Nausea; and Dizziness    HPI Heidi Walsh is a 70 y.o. female who presents ER for an episode this morning of severe abdominal pain, sweating, nausea and dizziness. Patient states she lay on the floor for about an hour due to dizziness. She denies fevers chills or other complaints. Currently nauseous but denies any vomiting. She thought she may be passing a kidney stone although she's never had one before.   Past Medical History  Diagnosis Date  . Atrial fibrillation ([place]) 2010    Followed by Dr. Jordan Hawks  . Mitral regurgitation     and prolapse  . Actinic keratosis     managed by Darlis Loan  . Osteopenia 56    Stable by repeat scans  . Breast cyst 2003    aspirated, fibroystics breasts with stable calcifications on left breast  . Leukopenia   . breast cancer 2010    s/p mastectomy    Patient Active Problem List   Diagnosis Date Noted  . Major depressive disorder, single episode 08/03/2015  . Lymphedema 08/03/2015  . Preoperative evaluation of a medical condition to rule out surgical contraindications (TAR required) 02/14/2014  . Medicare annual wellness visit, subsequent 09/23/2013  . Hepatic steatosis 03/25/2013  . S/P bilateral mastectomy 02/27/2013  . breast cancer   . Obesity (BMI 30-39.9) 11/17/2011  . 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 Sleep apnea 11/17/2011  . Atrial fibrillation ([place]) 11/17/2011  . History of breast cancer 11/17/2011    Past Surgical History  Procedure Laterality Date  . Mastectomy  02/2009    Bilateral  . Cesarean section      x 2  . Shoulder surgery      left rotator cuff  . Knee arthroscopy w/  meniscectomy  1997  . Atrial ablation surgery  Dec 2012    [facility]  . Total knee arthroplasty  02/2014    Left knee  . Mastectomy      Right and Left  . Joint replacement      Allergies Penicillins; Ancef; and Cephalexin  Social History Social History  Substance Use Topics  . Smoking status: Never Smoker   . Smokeless tobacco: Never Used  . Alcohol Use: 0.0 oz/week    0 Standard drinks or equivalent per week     Comment: cup of winw three times a week     Review of Systems Constitutional: Negative for fever. Eyes: Negative for visual changes. ENT: Negative for sore throat. Cardiovascular: Negative for chest pain. Respiratory: Negative for shortness of breath. Gastrointestinal: Positive for abdominal pain, nausea Genitourinary: Negative for dysuria. Musculoskeletal: Negative for back pain. Skin: Negative for rash. Neurological: Negative for headaches, focal weakness or numbness.  10-point ROS otherwise negative.  ____________________________________________   PHYSICAL EXAM:  VITAL SIGNS: ED Triage Vitals  Enc Vitals Group     BP 08/20/15 1325 119/76 mmHg     Pulse Rate 08/20/15 1325 75     Resp 08/20/15 1326 20     Temp 08/20/15 1325 97 F (36.1 C)     Temp Source 08/20/15 1325 Oral     SpO2 08/20/15 1325 99 %     Weight 08/20/15 1325 214 lb (97.07 kg)  Height 08/20/15 1325 5\' 7"  (1.702 m)     Head Cir --      Peak Flow --      Pain Score 08/20/15 1327 5     Pain Loc --      Pain Edu? --      Excl. in [place]? --     Constitutional: Alert and oriented. Well appearing and in no distress. Eyes: Conjunctivae are normal. PERRL. Normal extraocular movements. ENT   Head: Normocephalic and atraumatic.   Nose: No congestion/rhinnorhea.   Mouth/Throat: Mucous membranes are moist.   Neck: No stridor. Cardiovascular: Normal rate, regular rhythm. No murmurs, rubs, or gallops. Respiratory: Normal respiratory effort without tachypnea nor retractions.  Breath sounds are clear and equal bilaterally. No wheezes/rales/rhonchi. Gastrointestinal: Soft and nontender. Normal bowel sounds Musculoskeletal: Nontender with normal range of motion in all extremities. No lower extremity tenderness nor edema. Neurologic:  Normal speech and language. No gross focal neurologic deficits are appreciated.  Skin:  Skin is warm, dry and intact. No rash noted. Psychiatric: Mood and affect are normal. Speech and behavior are normal.  ____________________________________________  EKG: Interpreted by me.Sinus rhythm with a rate of 74 bpm, normal PR interval, normal QRS, normal QT interval. Possible LVH.  ____________________________________________  ED COURSE:  Pertinent labs & imaging results that were available during my care of the patient were reviewed by me and considered in my medical decision making (see chart for details). Patient resents to ER with nonspecific abdominal pain and nausea. I will check basic abdominal labs and reevaluate. ____________________________________________    LABS (pertinent positives/negatives)  Labs Reviewed  COMPREHENSIVE METABOLIC PANEL - Abnormal; Notable for the following:    Potassium 3.3 (*)    All other components within normal limits  URINALYSIS COMPLETEWITH MICROSCOPIC ([facility] ONLY) - Abnormal; Notable for the following:    Color, Urine STRAW (*)    APPearance CLEAR (*)    Bacteria, UA RARE (*)    Squamous Epithelial / LPF 0-5 (*)    All other components within normal limits  LIPASE, BLOOD  CBC  TROPONIN I    RADIOLOGY Images were viewed by me  Abdomen 2 view IMPRESSION: Nonobstructive bowel gas pattern with mild fecal retention throughout the colon. ____________________________________________  FINAL ASSESSMENT AND PLAN  Abdominal pain, nausea, Constipation  Plan: Patient with labs and imaging as dictated above. Patient is in no acute distress, will be discharged with Zofran and MiraLAX. Symptoms  are likely related to constipation. She is stable for outpatient follow-up with her doctor.   Earleen Newport, MD   Note: This dictation was prepared with Dragon dictation. Any transcriptional errors that result from this process are unintentional   Earleen Newport, MD 08/20/15 [phone]

## 2015-08-20 NOTE — Telephone Encounter (Signed)
Patient is currently in the ED, not able to reach. thanks

## 2015-08-20 NOTE — ED Notes (Signed)
Pt to ED today after having episode early this morning of severe abdominal pain, profuse sweating, nausea, dizziness.  Pt states she lay in the floor of the bathroom for approximately an hour due to dizziness.  Denies falling.  Denies vomiting.  Pain continues today.

## 2015-09-02 ENCOUNTER — Ambulatory Visit (INDEPENDENT_AMBULATORY_CARE_PROVIDER_SITE_OTHER): Payer: Medicare Other | Admitting: Internal Medicine

## 2015-09-02 ENCOUNTER — Encounter: Payer: Self-pay | Admitting: Internal Medicine

## 2015-09-02 VITALS — BP 124/78 | HR 78 | Temp 98.1°F | Resp 12 | Ht 67.0 in | Wt 211.8 lb

## 2015-09-02 DIAGNOSIS — E669 Obesity, unspecified: Secondary | ICD-10-CM | POA: Diagnosis not present

## 2015-09-02 DIAGNOSIS — K5900 Constipation, unspecified: Secondary | ICD-10-CM

## 2015-09-02 DIAGNOSIS — K5909 Other constipation: Secondary | ICD-10-CM

## 2015-09-02 DIAGNOSIS — F320 Major depressive disorder, single episode, mild: Secondary | ICD-10-CM

## 2015-09-02 MED ORDER — BUPROPION HCL ER (SR) 200 MG PO TB12: 200.0000 mg | ORAL_TABLET | Freq: Two times a day (BID) | ORAL | Status: DC

## 2015-09-02 NOTE — Progress Notes (Signed)
Pre-visit discussion using our clinic review tool. No additional management support is needed unless otherwise documented below in the visit note.  

## 2015-09-02 NOTE — Patient Instructions (Signed)
Try using the pineapple in the beet juice drink (beets, red apples, ginger , lemon juice and pineapples)   I am increasing your wellbutrin to 200 mg every 12 hours   YOU HAVE LOST 12-13 LBS IN THE PAST MONTH !!  THAT'S TREMENDOUS

## 2015-09-02 NOTE — Progress Notes (Signed)
Subjective:  Patient ID: Heidi Walsh, female    DOB: [birth year]  Age: 70 y.o. MRN: [id number]  CC: The primary encounter diagnosis was Major depressive disorder, single episode, mild ([place]). Diagnoses of Obesity (BMI 30-39.9) and Constipation, chronic were also pertinent to this visit.  HPI Heidi Walsh presents for follow up on depression , weight gain and constipation.  Since her last visit she has lost 13 lbs using the Green smoothie Diet ,  But had a worsening of her constipation and work up on May 31 with abdominal pain due to obstipation .  She was treated in the ED for severe constipation .  Since then she has been taking colace and miralax  daily and prn lactulose.   Depression:  She has been taking wellbutrin 100 mg q 12 hrs for the past moth but has felt no improvement in her symptoms of anhedonia, ambivalence and pessimism. She has no side effects from it . Has forgotten on several occasions to take the 2nd dose until evening and has had interruption of sleep.    Outpatient Prescriptions Prior to Visit  Medication Sig Dispense Refill  . aspirin 81 MG tablet Take 81 mg by mouth 2 (two) times daily.    . Calcium Carbonate-Vitamin D (CALCIUM-VITAMIN D) 500-200 MG-UNIT per tablet Take 1 tablet by mouth 2 (two) times daily with a meal.      . cetirizine (ZYRTEC) 10 MG tablet Take 10 mg by mouth daily.      . Doxylamine Succinate, Sleep, (SLEEP AID PO) Take by mouth.    . hydrochlorothiazide 25 MG tablet Take 25 mg by mouth daily.      . Lactulose 20 GM/30ML SOLN 30 ml every 4 hours until constipation is relieved 236 mL 3  . metoprolol succinate (TOPROL-XL) 25 MG 24 hr tablet Take 25 mg by mouth daily.    . Multiple Vitamin (MULTIVITAMIN) tablet Take 1 tablet by mouth daily.      . ondansetron (ZOFRAN) 4 MG tablet Take 1 tablet (4 mg total) by mouth daily as needed for nausea or vomiting. 20 tablet 1  . polyethylene glycol (MIRALAX / GLYCOLAX) packet Take 17 g by mouth daily.  14 each 0  . Tdap (BOOSTRIX) 5-2.5-18.5 LF-MCG/0.5 injection Inject 0.5 mLs into the muscle once. 0.5 mL 0  . buPROPion (WELLBUTRIN SR) 100 MG 12 hr tablet Take 1 tablet (100 mg total) by mouth 2 (two) times daily. 60 tablet 1   No facility-administered medications prior to visit.    Review of Systems;  Patient denies headache, fevers, malaise, unintentional weight loss, skin rash, eye pain, sinus congestion and sinus pain, sore throat, dysphagia,  hemoptysis , cough, dyspnea, wheezing, chest pain, palpitations, orthopnea, edema, abdominal pain, nausea, melena, diarrhea, constipation, flank pain, dysuria, hematuria, urinary  Frequency, nocturia, numbness, tingling, seizures,  Focal weakness, Loss of consciousness,  Tremor, insomnia, depression, anxiety, and suicidal ideation.      Objective:  BP 124/78 mmHg  Pulse 78  Temp(Src) 98.1 F (36.7 C) (Oral)  Resp 12  Ht 5\' 7"  (1.702 m)  Wt 211 lb 12 oz (96.049 kg)  BMI 33.16 kg/m2  SpO2 97%  BP Readings from Last 3 Encounters:  09/02/15 124/78  08/20/15 132/89  08/01/15 120/80    Wt Readings from Last 3 Encounters:  09/02/15 211 lb 12 oz (96.049 kg)  08/20/15 214 lb (97.07 kg)  08/01/15 224 lb (101.606 kg)    General appearance: alert, cooperative and appears stated  age Ears: normal TM's and external ear canals both ears Throat: lips, mucosa, and tongue normal; teeth and gums normal Neck: no adenopathy, no carotid bruit, supple, symmetrical, trachea midline and thyroid not enlarged, symmetric, no tenderness/mass/nodules Back: symmetric, no curvature. ROM normal. No CVA tenderness. Lungs: clear to auscultation bilaterally Heart: regular rate and rhythm, S1, S2 normal, no murmur, click, rub or gallop Abdomen: soft, non-tender; bowel sounds normal; no masses,  no organomegaly Pulses: 2+ and symmetric Skin: Skin color, texture, turgor normal. No rashes or lesions Lymph nodes: Cervical, supraclavicular, and axillary nodes  normal.  Lab Results  Component Value Date   HGBA1C 5.9 01/11/2013    Lab Results  Component Value Date   CREATININE 0.74 08/20/2015   CREATININE 0.69 11/06/2014   CREATININE 0.7 01/11/2013    Lab Results  Component Value Date   WBC 4.7 08/20/2015   HGB 15.0 08/20/2015   HCT 42.7 08/20/2015   PLT 188 08/20/2015   GLUCOSE 97 08/20/2015   CHOL 197 11/06/2014   TRIG 138.0 11/06/2014   HDL 52.50 11/06/2014   LDLDIRECT 146.4 01/11/2013   LDLCALC 117* 11/06/2014   ALT 39 08/20/2015   AST 28 08/20/2015   NA 138 08/20/2015   K 3.3* 08/20/2015   CL 102 08/20/2015   CREATININE 0.74 08/20/2015   BUN 13 08/20/2015   CO2 24 08/20/2015   TSH 2.050 08/01/2015   HGBA1C 5.9 01/11/2013    Dg Abd 2 Views  08/20/2015  CLINICAL DATA:  Lower abdominal pain and nausea 1 day. EXAM: ABDOMEN - 2 VIEW COMPARISON:  None. FINDINGS: Bowel gas pattern is nonobstructive with mild fecal retention throughout the colon. No free peritoneal air. No mass or mass effect. No abnormal calcifications within the abdomen. Multiple surgical clips are present over the lower chest bilaterally. Several calcifications are present over the pelvis likely phleboliths. There mild degenerate changes of the spine and right hip. 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 IMPRESSION: Nonobstructive bowel gas pattern with mild fecal retention throughout the colon. Electronically Signed   By: Marin Olp M.D.   On: 08/20/2015 15:36    Assessment & Plan:   Problem List Items Addressed This Visit    Obesity (BMI 30-39.9)    I have congratulated her in reduction of   BMI and encouraged  Continued weight loss with goal of 10% of body weigh over the next 6 months using a low glycemic index diet and regular exercise a minimum of 5 days per week.        Major depressive disorder, single episode - Primary    Today we discussed increasing her dose to 200 mg twice daily .  If she does not tolerate the 200 mg dose, she can reduce dose to 1.5  tablets twice daily      Relevant Medications   buPROPion (WELLBUTRIN SR) 200 MG 12 hr tablet   Constipation, chronic    Plain films of abdomen done in ED on May 31 suggested severe constipation.   Continue daily stool softener and BFL,   Prn  Lactulose.           I have changed Ms. Carne's buPROPion. I am also having her maintain her hydrochlorothiazide, cetirizine, multivitamin, calcium-vitamin D, (Doxylamine Succinate, Sleep, (SLEEP AID PO)), aspirin, metoprolol succinate, Tdap, Lactulose, ondansetron, and polyethylene glycol.  Meds ordered this encounter  Medications  . buPROPion (WELLBUTRIN SR) 200 MG 12 hr tablet    Sig: Take 1 tablet (200 mg total) by mouth 2 (two)  times daily.    Dispense:  60 tablet    Refill:  2    Medications Discontinued During This Encounter  Medication Reason  . buPROPion (WELLBUTRIN SR) 100 MG 12 hr tablet Reorder    Follow-up: Return NEEDS MID AUGUST APPT .   Crecencio Mc, MD

## 2015-09-03 DIAGNOSIS — K5909 Other constipation: Secondary | ICD-10-CM | POA: Insufficient documentation

## 2015-09-03 NOTE — Telephone Encounter (Signed)
Mailed unread message to patient. thanks 

## 2015-09-03 NOTE — Assessment & Plan Note (Signed)
I have congratulated her in reduction of   BMI and encouraged  Continued weight loss with goal of 10% of body weigh over the next 6 months using a low glycemic index diet and regular exercise a minimum of 5 days per week.    

## 2015-09-03 NOTE — Assessment & Plan Note (Signed)
Plain films of abdomen done in ED on May 31 suggested severe constipation.   Continue daily stool softener and BFL,   Prn  Lactulose.

## 2015-09-03 NOTE — Assessment & Plan Note (Addendum)
Today we discussed increasing her dose to 200 mg twice daily .  If she does not tolerate the 200 mg dose, she can reduce dose to 1.5 tablets twice daily

## 2015-09-20 ENCOUNTER — Telehealth: Payer: Self-pay

## 2015-09-20 NOTE — Telephone Encounter (Signed)
Patient is on the list for [employer] 2017 and may be a good candidate for an AWV in 2017. Please let me know if/when appt is scheduled.   Note: pt has a follow up in August 2017 and is due for annual at that time as well.

## 2015-09-22 NOTE — Telephone Encounter (Signed)
Noted. Will follow as appropriate.  

## 2015-09-25 ENCOUNTER — Other Ambulatory Visit: Payer: Self-pay | Admitting: Internal Medicine

## 2015-09-30 NOTE — Telephone Encounter (Signed)
Left message for the patient to return my call regarding scheduling annual wellness visit.

## 2015-11-10 ENCOUNTER — Ambulatory Visit: Payer: Medicare Other | Admitting: Internal Medicine

## 2015-11-29 ENCOUNTER — Other Ambulatory Visit: Payer: Self-pay | Admitting: Internal Medicine

## 2015-12-17 ENCOUNTER — Ambulatory Visit: Payer: Medicare Other | Admitting: Internal Medicine

## 2015-12-23 ENCOUNTER — Telehealth: Payer: Self-pay | Admitting: *Deleted

## 2015-12-23 ENCOUNTER — Other Ambulatory Visit: Payer: Self-pay | Admitting: Internal Medicine

## 2015-12-23 MED ORDER — BUPROPION HCL ER (SR) 100 MG PO TB12: 100.0000 mg | ORAL_TABLET | Freq: Two times a day (BID) | ORAL | 1 refills | Status: DC

## 2015-12-23 MED ORDER — BUPROPION HCL ER (SR) 100 MG PO TB12: 100.0000 mg | ORAL_TABLET | Freq: Two times a day (BID) | ORAL | 2 refills | Status: DC

## 2015-12-23 NOTE — Telephone Encounter (Signed)
90 Day supply sent 

## 2015-12-23 NOTE — Telephone Encounter (Signed)
reiflled 12/01/15. Pt last seen 09/02/15. Please advise?

## 2015-12-23 NOTE — Telephone Encounter (Signed)
[facility] requested a 90 day supply for bupropion [facility] [place]

## 2015-12-30 ENCOUNTER — Other Ambulatory Visit: Payer: Self-pay | Admitting: Internal Medicine

## 2016-01-05 ENCOUNTER — Ambulatory Visit (INDEPENDENT_AMBULATORY_CARE_PROVIDER_SITE_OTHER): Payer: Medicare Other | Admitting: Internal Medicine

## 2016-01-05 ENCOUNTER — Encounter: Payer: Self-pay | Admitting: Internal Medicine

## 2016-01-05 DIAGNOSIS — E669 Obesity, unspecified: Secondary | ICD-10-CM

## 2016-01-05 DIAGNOSIS — F320 Major depressive disorder, single episode, mild: Secondary | ICD-10-CM

## 2016-01-05 NOTE — Progress Notes (Signed)
Pre-visit discussion using our clinic review tool. No additional management support is needed unless otherwise documented below in the visit note.  

## 2016-01-05 NOTE — Progress Notes (Signed)
Subjective:  Patient ID: Heidi Walsh, female    DOB: April 09, 1945  Age: 70 y.o. MRN: [id number]  CC: Diagnoses of Obesity (BMI 30-39.9) and Mild single current episode of major depressive disorder ([place]) were pertinent to this visit.  HPI Heidi Walsh presents for follow up on depression and obesity.  She feels generally well and has  lost 16 lbs since May .  She is disappointed, however that she has not yet reached her goal  Of  < 200.  Exercise regimen reviewed.  She is Walking daily but is no longer swimming daily since her husband closed their pool for the year. She declines use of public pools due to multiple inquiries from women both in the pool and in the dressing room about her bilateral mastectomy .  It has made her self conscious.  Her exercise choices are limited by bilateral knee pain, and hip pain.  but she has found that she can ride a bicycle. Right hip pain , .   Deferring hip replacement (right)  By [employer].  Wants to wait a and lose more  weight,  Has had left hip and both knees.  Diet reviewed.  Looking for nutritional advice.    Wondering about nutritional supplements with magnesium bc several friends have reported improved QOL with magnesium supplements .  Wants to consider a trial of  Serovital"  Nutritional supplement.   Taking probiotic and a MVI,  Taking calcium,  Zyrtec .  Metoprolol  hctz  And 162  Mg aspirin Taking 2 baby aspirin daily per Fath.  Instead of Eliquis.Still doing a Green smoothie twice daily some days and supplementing with protein (eggs) .  Avoiding dairy . Snacking on fruits,   Using quinoa, winter wheatberries. cpnstipation resolved.    Depression:  improving on wellbutrin 100 mg bid.  X huband,  Father of her children, recently passed away from Alzheimers Dementia. Family reunited  After his death for a week at the beach.     Outpatient Medications Prior to Visit  Medication Sig Dispense Refill  . aspirin 81 MG tablet Take 81  mg by mouth 2 (two) times daily.    Marland Kitchen buPROPion (WELLBUTRIN SR) 100 MG 12 hr tablet Take 1 tablet (100 mg total) by mouth 2 (two) times daily. 180 tablet 1  . Calcium Carbonate-Vitamin D (CALCIUM-VITAMIN D) 500-200 MG-UNIT per tablet Take 1 tablet by mouth 2 (two) times daily with a meal.      . cetirizine (ZYRTEC) 10 MG tablet Take 10 mg by mouth daily.      . hydrochlorothiazide 25 MG tablet Take 25 mg by mouth daily.      . metoprolol succinate (TOPROL-XL) 25 MG 24 hr tablet Take 25 mg by mouth daily.    . Multiple Vitamin (MULTIVITAMIN) tablet Take 1 tablet by mouth daily.      . ondansetron (ZOFRAN) 4 MG tablet Take 1 tablet (4 mg total) by mouth daily as needed for nausea or vomiting. 20 tablet 1  . Tdap (BOOSTRIX) 5-2.5-18.5 LF-MCG/0.5 injection Inject 0.5 mLs into the muscle once. 0.5 mL 0  . Doxylamine Succinate, Sleep, (SLEEP AID PO) Take by mouth.    . polyethylene glycol (MIRALAX / GLYCOLAX) packet Take 17 g by mouth daily. (Patient not taking: Reported on 01/05/2016) 14 each 0  . Lactulose 20 GM/30ML SOLN 30 ml every 4 hours until constipation is relieved (Patient not taking: Reported on 01/05/2016) 236 mL 3   No facility-administered medications prior  to visit.     Review of Systems;  Patient denies headache, fevers, malaise, unintentional weight loss, skin rash, eye pain, sinus congestion and sinus pain, sore throat, dysphagia,  hemoptysis , cough, dyspnea, wheezing, chest pain, palpitations, orthopnea, edema, abdominal pain, nausea, melena, diarrhea, constipation, flank pain, dysuria, hematuria, urinary  Frequency, nocturia, numbness, tingling, seizures,  Focal weakness, Loss of consciousness,  Tremor, insomnia, depression, anxiety, and suicidal ideation.      Objective:  BP 128/78   Pulse 79   Temp 98 F (36.7 C) (Oral)   Resp 10   Ht 5\' 7"  (1.702 m)   Wt 208 lb 8 oz (94.6 kg)   SpO2 96%   BMI 32.66 kg/m   BP Readings from Last 3 Encounters:  01/05/16 128/78    09/02/15 124/78  08/20/15 132/89    Wt Readings from Last 3 Encounters:  01/05/16 208 lb 8 oz (94.6 kg)  09/02/15 211 lb 12 oz (96 kg)  08/20/15 214 lb (97.1 kg)    General appearance: alert, cooperative and appears stated age Ears: normal TM's and external ear canals both ears Throat: lips, mucosa, and tongue normal; teeth and gums normal Neck: no adenopathy, no carotid bruit, supple, symmetrical, trachea midline and thyroid not enlarged, symmetric, no tenderness/mass/nodules Back: symmetric, no curvature. ROM normal. No CVA tenderness. Lungs: clear to auscultation bilaterally Heart: regular rate and rhythm, S1, S2 normal, no murmur, click, rub or gallop Abdomen: soft, non-tender; bowel sounds normal; no masses,  no organomegaly Pulses: 2+ and symmetric Skin: Skin color, texture, turgor normal. No rashes or lesions Lymph nodes: Cervical, supraclavicular, and axillary nodes normal.  Lab Results  Component Value Date   HGBA1C 5.9 01/11/2013    Lab Results  Component Value Date   CREATININE 0.74 08/20/2015   CREATININE 0.69 11/06/2014   CREATININE 0.7 01/11/2013    Lab Results  Component Value Date   WBC 4.7 08/20/2015   HGB 15.0 08/20/2015   HCT 42.7 08/20/2015   PLT 188 08/20/2015   GLUCOSE 97 08/20/2015   CHOL 197 11/06/2014   TRIG 138.0 11/06/2014   HDL 52.50 11/06/2014   LDLDIRECT 146.4 01/11/2013   LDLCALC 117 (H) 11/06/2014   ALT 39 08/20/2015   AST 28 08/20/2015   NA 138 08/20/2015   K 3.3 (L) 08/20/2015   CL 102 08/20/2015   CREATININE 0.74 08/20/2015   BUN 13 08/20/2015   CO2 24 08/20/2015   TSH 2.050 08/01/2015   HGBA1C 5.9 01/11/2013     Assessment & Plan:   Problem List Items Addressed This Visit    Obesity (BMI 30-39.9)    I have congratulated her in reduction of   BMI and encouraged  Continued weight loss with goal of 10% of body weight over the next 6 months using a low glycemic index diet.  Encouraged to add  regular exercise a minimum  of 5 days per week.  Walking is not sufficient       Major depressive disorder, single episode    Improving symptoms use of wellbutrin,  No changes today        Other Visit Diagnoses   None.    A total of 40 minutes was spent with patient more than half of which was spent in counseling patient on the above mentioned issues ,  I have discontinued Ms. Dunbar's Lactulose. I am also having her maintain her hydrochlorothiazide, cetirizine, multivitamin, calcium-vitamin D, (Doxylamine Succinate, Sleep, (SLEEP AID PO)), aspirin, metoprolol succinate, Tdap, ondansetron, polyethylene  glycol, buPROPion, and docusate sodium.  Meds ordered this encounter  Medications  . docusate sodium (COLACE) 100 MG capsule    Sig: Take 100 mg by mouth 2 (two) times daily.    Medications Discontinued During This Encounter  Medication Reason  . Lactulose 20 GM/30ML SOLN   . Lactulose 20 GM/30ML SOLN     Follow-up: Return in about 3 months (around 04/06/2016).   Crecencio Mc, MD

## 2016-01-05 NOTE — Patient Instructions (Signed)
Increase your vitamin  d to 2000 ius daily

## 2016-01-06 NOTE — Assessment & Plan Note (Addendum)
Improving symptoms use of wellbutrin,  No changes today

## 2016-01-06 NOTE — Assessment & Plan Note (Signed)
I have congratulated her in reduction of   BMI and encouraged  Continued weight loss with goal of 10% of body weight over the next 6 months using a low glycemic index diet.  Encouraged to add  regular exercise a minimum of 5 days per week.  Walking is not sufficient

## 2016-04-07 ENCOUNTER — Ambulatory Visit: Payer: Medicare Other | Admitting: Internal Medicine

## 2016-04-12 ENCOUNTER — Ambulatory Visit (INDEPENDENT_AMBULATORY_CARE_PROVIDER_SITE_OTHER): Payer: Medicare Other | Admitting: Internal Medicine

## 2016-04-12 ENCOUNTER — Encounter: Payer: Self-pay | Admitting: Internal Medicine

## 2016-04-12 DIAGNOSIS — J209 Acute bronchitis, unspecified: Secondary | ICD-10-CM

## 2016-04-12 DIAGNOSIS — E669 Obesity, unspecified: Secondary | ICD-10-CM

## 2016-04-12 MED ORDER — HYDROCOD POLST-CPM POLST ER 10-8 MG/5ML PO SUER: 5.0000 mL | Freq: Every evening | ORAL | 0 refills | Status: DC | PRN

## 2016-04-12 MED ORDER — AZITHROMYCIN 500 MG PO TABS: 500.0000 mg | ORAL_TABLET | Freq: Every day | ORAL | 0 refills | Status: DC

## 2016-04-12 MED ORDER — PREDNISONE 10 MG PO TABS: ORAL_TABLET | ORAL | 0 refills | Status: DC

## 2016-04-12 NOTE — Patient Instructions (Signed)
I AM PRESCRIBING AZITHROMYCIN,  PREDNISONE TAPER,   AND TUSSIONEX  TO TAKE FOR THE NEXT 7 TO 10 DAYS   Taking an antibiotic can create an imbalance in the normal population of bacteria that live in the small intestine.  This imbalance can persist for 3 months.   Taking a probiotic ( Align, Floraque or Culturelle), the generic version of one of these over the counter medications, or an alternative form (kombucha,  Yogurt, or another dietary source) for a minimum of 3 weeks may help prevent a serious antibiotic associated diarrhea  Called clostridium dificile colitis that occurs when the bacteria population is altered .  Taking a probiotic may also prevent vaginitis due to yeast infections and can be continued indefinitely if you feel that it improves your digestion or your elimination (bowels).    WHEN YOU FEEL BETTER:    I am recommending use of the medication called Saxenda to help you lose weight.  It is similar to a a medicine that is used to treat diabetes called Victoza,  So It may lower your blood sugars .   It is injected daily in incrementally increasing doses (if tolerated,  Nausea usually resolves in a few days)"  0.6 mg daily   Week 1 1.2 mg daily Week 2 1.8 mg  Daly Week 3 2.4 mg daily Week 4 3.0 mg daily Week 5 and ongoing   If you want to bring the pen with you to be shown how to give yourself the dose,  We wil make you an  RN visit once you pick up your medication from your pharmacy

## 2016-04-12 NOTE — Progress Notes (Signed)
Subjective:  Patient ID: Heidi Walsh, female    DOB: 1945-04-08  Age: 71 y.o. MRN: [id number]  CC: Diagnoses of Acute bronchitis, unspecified organism and Obesity (BMI 30-39.9) were pertinent to this visit.  HPI Heidi Walsh presents for evaluation and treatment of cough and congestion that have been present for one week. She has been having paroxysms of cough, started a few days ago..  taking cough suppressant,  Decongestant, and tylenol.  Using CPAP and cleans machine fastidiously .  Headache, ears feeling full,  Sinus drainage is clear.  Cough is nonproductive mostly,   Purulent but she denies fevers.   Had flu shot at [facility] in November       Outpatient Medications Prior to Visit  Medication Sig Dispense Refill  . aspirin 81 MG tablet Take 81 mg by mouth 2 (two) times daily.    Marland Kitchen buPROPion (WELLBUTRIN SR) 100 MG 12 hr tablet Take 1 tablet (100 mg total) by mouth 2 (two) times daily. 180 tablet 1  . Calcium Carbonate-Vitamin D (CALCIUM-VITAMIN D) 500-200 MG-UNIT per tablet Take 1 tablet by mouth 2 (two) times daily with a meal.      . cetirizine (ZYRTEC) 10 MG tablet Take 10 mg by mouth daily.      Marland Kitchen docusate sodium (COLACE) 100 MG capsule Take 100 mg by mouth 2 (two) times daily.    . Doxylamine Succinate, Sleep, (SLEEP AID PO) Take by mouth.    . hydrochlorothiazide 25 MG tablet Take 25 mg by mouth daily.      . metoprolol succinate (TOPROL-XL) 25 MG 24 hr tablet Take 25 mg by mouth daily.    . Multiple Vitamin (MULTIVITAMIN) tablet Take 1 tablet by mouth daily.      . ondansetron (ZOFRAN) 4 MG tablet Take 1 tablet (4 mg total) by mouth daily as needed for nausea or vomiting. 20 tablet 1  . polyethylene glycol (MIRALAX / GLYCOLAX) packet Take 17 g by mouth daily. 14 each 0  . Tdap (BOOSTRIX) 5-2.5-18.5 LF-MCG/0.5 injection Inject 0.5 mLs into the muscle once. 0.5 mL 0   No facility-administered medications prior to visit.     Review of Systems;  Patient denies   unintentional weight loss, skin rash, eye pain, sinus congestion and sinus pain, sore throat, dysphagia,  hemoptysis ,  dyspnea, wheezing, chest pain, palpitations, orthopnea, edema, abdominal pain, nausea, melena, diarrhea, constipation, flank pain, dysuria, hematuria, urinary  Frequency, nocturia, numbness, tingling, seizures,  Focal weakness, Loss of consciousness,  Tremor, insomnia, depression, anxiety, and suicidal ideation.      Objective:  BP 116/76   Pulse 83   Temp 97.9 F (36.6 C) (Oral)   Resp 15   Wt 212 lb 3.2 oz (96.3 kg)   SpO2 96%   BMI 33.24 kg/m   BP Readings from Last 3 Encounters:  04/12/16 116/76  01/05/16 128/78  09/02/15 124/78    Wt Readings from Last 3 Encounters:  04/12/16 212 lb 3.2 oz (96.3 kg)  01/05/16 208 lb 8 oz (94.6 kg)  09/02/15 211 lb 12 oz (96 kg)    General appearance: alert, cooperative and appears stated age Ears: normal TM's and external ear canals both ears Throat: lips, mucosa, and tongue normal; teeth and gums normal Neck: no adenopathy, no carotid bruit, supple, symmetrical, trachea midline and thyroid not enlarged, symmetric, no tenderness/mass/nodules Back: symmetric, no curvature. ROM normal. No CVA tenderness. Lungs: clear to auscultation bilaterally Heart: regular rate and rhythm, S1, S2 normal, no  murmur, click, rub or gallop Abdomen: soft, non-tender; bowel sounds normal; no masses,  no organomegaly Pulses: 2+ and symmetric Skin: Skin color, texture, turgor normal. No rashes or lesions Lymph nodes: Cervical, supraclavicular, and axillary nodes normal.  Lab Results  Component Value Date   HGBA1C 5.9 01/11/2013    Lab Results  Component Value Date   CREATININE 0.74 08/20/2015   CREATININE 0.69 11/06/2014   CREATININE 0.7 01/11/2013    Lab Results  Component Value Date   WBC 4.7 08/20/2015   HGB 15.0 08/20/2015   HCT 42.7 08/20/2015   PLT 188 08/20/2015   GLUCOSE 97 08/20/2015   CHOL 197 11/06/2014   TRIG  138.0 11/06/2014   HDL 52.50 11/06/2014   LDLDIRECT 146.4 01/11/2013   LDLCALC 117 (H) 11/06/2014   ALT 39 08/20/2015   AST 28 08/20/2015   NA 138 08/20/2015   K 3.3 (L) 08/20/2015   CL 102 08/20/2015   CREATININE 0.74 08/20/2015   BUN 13 08/20/2015   CO2 24 08/20/2015   TSH 2.050 08/01/2015   HGBA1C 5.9 01/11/2013    Dg Abd 2 Views  Result Date: 08/20/2015 CLINICAL DATA:  Lower abdominal pain and nausea 1 day. EXAM: ABDOMEN - 2 VIEW COMPARISON:  None. FINDINGS: Bowel gas pattern is nonobstructive with mild fecal retention throughout the colon. No free peritoneal air. No mass or mass effect. No abnormal calcifications within the abdomen. Multiple surgical clips are present over the lower chest bilaterally. Several calcifications are present over the pelvis likely phleboliths. There mild degenerate changes of the spine and right hip. 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 IMPRESSION: Nonobstructive bowel gas pattern with mild fecal retention throughout the colon. Electronically Signed   By: Marin Olp M.D.   On: 08/20/2015 15:36    Assessment & Plan:   Problem List Items Addressed This Visit    Acute bronchitis    Given her use of CPAP and the absence of sinus symptoms,  Will treat with azithromycin, prednisone taper and cough suppressant.       Obesity (BMI 30-39.9)    I have addressed  BMI and recommended wt loss of 10% of body weight over the next 6 months using a low fat, low starch, high protein  fruit/vegetable based Mediterranean diet and 30 minutes of aerobic exercise a minimum of 5 days per week.   am recommending use of the medication called Saxenda to help her lose weight.          I am having Heidi Walsh start on chlorpheniramine-HYDROcodone, azithromycin, and predniSONE. I am also having her maintain her hydrochlorothiazide, cetirizine, multivitamin, calcium-vitamin D, (Doxylamine Succinate, Sleep, (SLEEP AID PO)), aspirin, metoprolol succinate, Tdap, ondansetron,  polyethylene glycol, buPROPion, and docusate sodium.  Meds ordered this encounter  Medications  . chlorpheniramine-HYDROcodone (TUSSIONEX PENNKINETIC ER) 10-8 MG/5ML SUER    Sig: Take 5 mLs by mouth at bedtime as needed for cough.    Dispense:  180 mL    Refill:  0  . azithromycin (ZITHROMAX) 500 MG tablet    Sig: Take 1 tablet (500 mg total) by mouth daily.    Dispense:  7 tablet    Refill:  0  . predniSONE (DELTASONE) 10 MG tablet    Sig: 6 tablets on Day 1 , then reduce by 1 tablet daily until gone    Dispense:  21 tablet    Refill:  0    There are no discontinued medications.  Follow-up: No Follow-up on file.   Lorelie Biermann,  Aris Everts, MD

## 2016-04-13 DIAGNOSIS — J209 Acute bronchitis, unspecified: Secondary | ICD-10-CM | POA: Insufficient documentation

## 2016-04-13 NOTE — Assessment & Plan Note (Signed)
I have addressed  BMI and recommended wt loss of 10% of body weight over the next 6 months using a low fat, low starch, high protein  fruit/vegetable based Mediterranean diet and 30 minutes of aerobic exercise a minimum of 5 days per week.   am recommending use of the medication called Saxenda to help her lose weight.

## 2016-04-13 NOTE — Assessment & Plan Note (Signed)
Given her use of CPAP and the absence of sinus symptoms,  Will treat with azithromycin, prednisone taper and cough suppressant.

## 2016-06-08 ENCOUNTER — Encounter: Payer: Self-pay | Admitting: Internal Medicine

## 2016-06-09 MED ORDER — LIRAGLUTIDE -WEIGHT MANAGEMENT 18 MG/3ML ~~LOC~~ SOPN: 0.6000 mg | PEN_INJECTOR | Freq: Every day | SUBCUTANEOUS | 0 refills | Status: DC

## 2016-06-09 MED ORDER — BUPROPION HCL ER (SR) 100 MG PO TB12: 100.0000 mg | ORAL_TABLET | Freq: Two times a day (BID) | ORAL | 5 refills | Status: DC

## 2016-06-09 MED ORDER — PEN NEEDLES 31G X 6 MM MISC: 1 refills | Status: DC

## 2016-06-09 NOTE — Telephone Encounter (Signed)
wellbutrin refilled,  Saxenda (victoza, same med) sent along with the needles needed .  Please scheudle rn visit to show her how to use the pen

## 2016-06-09 NOTE — Telephone Encounter (Signed)
Wellbutrin was last filled on 12/23/2015, #180 with one refill, orders are to take 2 a day? Please advise, for victoza. thanks

## 2016-06-16 ENCOUNTER — Telehealth: Payer: Self-pay | Admitting: Internal Medicine

## 2016-06-16 ENCOUNTER — Ambulatory Visit: Payer: Medicare Other

## 2016-06-16 NOTE — Telephone Encounter (Signed)
PA started for Saxenda on CoverMymeds Patient is medicare [facility] for pa [id number]

## 2016-09-03 ENCOUNTER — Telehealth: Payer: Self-pay | Admitting: Internal Medicine

## 2016-09-03 NOTE — Telephone Encounter (Signed)
fyi

## 2016-09-03 NOTE — Telephone Encounter (Signed)
Patient Name: AVYANNA SPADA DOB: September 27, 1945 Initial Comment Caller says , blood in urine getting worse since Tues, passed 2 blood clots, and lower abd cramps like mild menstrual cramps. Nurse Assessment Nurse: Erling Cruz, RN, Morey Hummingbird Date/Time (Eastern Time): 09/03/2016 9:23:34 AM Confirm and document reason for call. If symptomatic, describe symptoms. ---Caller states blood in urine getting worse since Tues, passed 2 blood clots, and lower abd cramps like mild menstrual cramps. No fever. Out-of-town. Does the patient have any new or worsening symptoms? ---Yes Will a triage be completed? ---Yes Related visit to physician within the last 2 weeks? ---No Does the PT have any chronic conditions? (i.e. diabetes, asthma, etc.) ---Yes List chronic conditions. ---Breast CA in remission, AFib Is this a behavioral health or substance abuse call? ---No Guidelines Guideline Title Affirmed Question Affirmed Notes Urine - Blood In [1] Pink or red-colored urine and likely from food (beets, rhubarb, red food dye) AND [2] lasts > 24 hours after stopping food Final Disposition User See Physician within 24 Hours Love, RN, Morey Hummingbird Comments Appt not necessary as patient it out of town. Referrals GO TO FACILITY UNDECIDED Disagree/Comply: Comply

## 2016-09-03 NOTE — Telephone Encounter (Signed)
Called patient left message to return my call.

## 2016-09-03 NOTE — Telephone Encounter (Signed)
Pt called and stated that she has blood in her urine and has been progressively getting worse since Tuesday. Pt stated that she is having lower abdominal discomfort like menstrual cramps, she has also passed 2 blood clots. Pt states that she is out of town. Sent call to [facility] triage.  Call pt @ [phone]

## 2016-09-03 NOTE — Telephone Encounter (Signed)
Patient advised of below she agreed to go urgent care to be evaluated. She was  Resistant  At first. I explained the importance to her.  She stated that when she was urinating the water was pinkish in color .   I told her she needed to be evaluated within 24 hours.

## 2016-09-03 NOTE — Telephone Encounter (Signed)
Agree with evaluation now.  Thanks.

## 2016-09-06 ENCOUNTER — Other Ambulatory Visit: Payer: Self-pay | Admitting: Internal Medicine

## 2016-09-06 ENCOUNTER — Other Ambulatory Visit (INDEPENDENT_AMBULATORY_CARE_PROVIDER_SITE_OTHER): Payer: Medicare Other

## 2016-09-06 ENCOUNTER — Encounter: Payer: Self-pay | Admitting: Internal Medicine

## 2016-09-06 DIAGNOSIS — R102 Pelvic and perineal pain: Secondary | ICD-10-CM | POA: Diagnosis not present

## 2016-09-07 LAB — URINALYSIS, ROUTINE W REFLEX MICROSCOPIC
Bilirubin Urine: NEGATIVE
KETONES UR: NEGATIVE
Leukocytes, UA: NEGATIVE
Nitrite: NEGATIVE
PH: 6 (ref 5.0–8.0)
Total Protein, Urine: NEGATIVE
UROBILINOGEN UA: 0.2 (ref 0.0–1.0)
Urine Glucose: NEGATIVE

## 2016-09-07 LAB — URINE CULTURE

## 2016-09-08 ENCOUNTER — Encounter: Payer: Self-pay | Admitting: Internal Medicine

## 2016-09-08 DIAGNOSIS — R310 Gross hematuria: Secondary | ICD-10-CM

## 2016-11-02 ENCOUNTER — Telehealth: Payer: Self-pay | Admitting: Internal Medicine

## 2016-11-02 NOTE — Telephone Encounter (Signed)
*  NOTE CORRECTION* Pt last AWV 10/28/14

## 2016-11-02 NOTE — Telephone Encounter (Signed)
Left pt message asking to call Allison back directly at [phone] to schedule AWV. Thanks! ° °*NOTE* Never had AWV before °

## 2016-11-18 ENCOUNTER — Encounter: Payer: Self-pay | Admitting: Internal Medicine

## 2016-11-18 DIAGNOSIS — M8580 Other specified disorders of bone density and structure, unspecified site: Secondary | ICD-10-CM | POA: Insufficient documentation

## 2016-11-18 DIAGNOSIS — C679 Malignant neoplasm of bladder, unspecified: Secondary | ICD-10-CM

## 2016-11-18 HISTORY — DX: Malignant neoplasm of bladder, unspecified: C67.9

## 2016-12-08 NOTE — Telephone Encounter (Signed)
Spoke to pt spouse. He will have pt call me back.

## 2016-12-12 ENCOUNTER — Encounter: Payer: Self-pay | Admitting: Internal Medicine

## 2016-12-21 ENCOUNTER — Ambulatory Visit (INDEPENDENT_AMBULATORY_CARE_PROVIDER_SITE_OTHER): Payer: Medicare Other

## 2016-12-21 VITALS — BP 128/78 | HR 79 | Temp 98.3°F | Resp 15 | Ht 67.0 in | Wt 220.4 lb

## 2016-12-21 DIAGNOSIS — E2839 Other primary ovarian failure: Secondary | ICD-10-CM | POA: Diagnosis not present

## 2016-12-21 DIAGNOSIS — Z0000 Encounter for general adult medical examination without abnormal findings: Secondary | ICD-10-CM

## 2016-12-21 NOTE — Patient Instructions (Addendum)
[name] , Thank you for taking time to come for your Medicare Wellness Visit. I appreciate your ongoing commitment to your health goals. Please review the following plan we discussed and let me know if I can assist you in the future.   Follow up with Dr. Derrel Walsh as needed.    Bring a copy of your [place] and/or Living Will to be scanned into chart.  Follow up with your local pharmacy regarding FLU VACCINE and PNEUMOVAX 23 VACCINE.  Dexa Scan ordered; follow as directed.  Have a great day!  These are the goals we discussed: Goals    . Increase physical activity          Continue to increase exercise as tolerated    . Increase water intake          Stay hydrated    . Reduce portion size       This is a list of the screening recommended for you and due dates:  Health Maintenance  Topic Date Due  . DEXA scan (bone density measurement)  10/08/2012  . Pneumonia vaccines (2 of 2 - PPSV23) 09/21/2014  . Flu Shot  10/20/2016  . Colon Cancer Screening  10/28/2022  . Tetanus Vaccine  12/28/2023  .  Hepatitis C: One time screening is recommended by Center for Disease Control  (CDC) for  adults born from [birth year] through 1963.   Completed  . Mammogram  Excluded    Bone Densitometry Bone densitometry is an imaging test that uses a special X-ray to measure the amount of calcium and other minerals in your bones (bone density). This test is also known as a bone mineral density test or dual-energy X-ray absorptiometry (DXA). The test can measure bone density at your hip and your spine. It is similar to having a regular X-ray. You may have this test to:  Diagnose a condition that causes weak or thin bones (osteoporosis).  Predict your risk of a broken bone (fracture).  Determine how well osteoporosis treatment is working.  Tell a health care provider about:  Any allergies you have.  All medicines you are taking, including vitamins, herbs, eye drops, creams, and  over-the-counter medicines.  Any problems you or family members have had with anesthetic medicines.  Any blood disorders you have.  Any surgeries you have had.  Any medical conditions you have.  Possibility of pregnancy.  Any other medical test you had within the previous 14 days that used contrast material. What are the risks? Generally, this is a safe procedure. However, problems can occur and may include the following:  This test exposes you to a very small amount of radiation.  The risks of radiation exposure may be greater to unborn children.  What happens before the procedure?  Do not take any calcium supplements for 24 hours before having the test. You can otherwise eat and drink what you usually do.  Take off all metal jewelry, eyeglasses, dental appliances, and any other metal objects. What happens during the procedure?  You may lie on an exam table. There will be an X-ray generator below you and an imaging device above you.  Other devices, such as boxes or braces, may be used to position your body properly for the scan.  You will need to lie still while the machine slowly scans your body.  The images will show up on a computer monitor. What happens after the procedure? You may need more testing at a later time.  This information is not intended to replace advice given to you by your health care provider. Make sure you discuss any questions you have with your health care provider. Document Released: 03/30/2004 Document Revised: 08/14/2015 Document Reviewed: 08/16/2013 Elsevier Interactive Patient Education  2018 [employer].

## 2016-12-21 NOTE — Progress Notes (Signed)
Subjective:   Rubbie Goostree is a 71 y.o. female who presents for Medicare Annual (Subsequent) preventive examination.  Review of Systems:  No ROS.  Medicare Wellness Visit. Additional risk factors are reflected in the social history. Cardiac Risk Factors include: advanced age (>45men, >85 women);obesity (BMI >30kg/m2)     Objective:     Vitals: BP 128/78 (BP Location: Right Arm, Patient Position: Sitting, Cuff Size: Normal)   Pulse 79   Temp 98.3 F (36.8 C) (Oral)   Resp 15   Ht 5\' 7"  (1.702 m)   Wt 220 lb 6.4 oz (100 kg)   SpO2 98%   BMI 34.52 kg/m   Body mass index is 34.52 kg/m.   Tobacco History  Smoking Status  . Never Smoker  Smokeless Tobacco  . Never Used     Counseling given: Not Answered   Past Medical History:  Diagnosis Date  . Actinic keratosis    managed by Darlis Loan  . Atrial fibrillation [facility]) 2010   Followed by Dr. Jordan Hawks  . breast cancer 2010   s/p mastectomy  . Breast cyst 2003   aspirated, fibroystics breasts with stable calcifications on left breast  . Leukopenia   . Mitral regurgitation    and prolapse  . Osteopenia 56   Stable by repeat scans   Past Surgical History:  Procedure Laterality Date  . ATRIAL ABLATION SURGERY  Dec 2012   [facility]  . CESAREAN SECTION     x 2  . JOINT REPLACEMENT    . KNEE ARTHROSCOPY W/ MENISCECTOMY  1997  . MASTECTOMY  02/2009   Bilateral  . MASTECTOMY     Right and Left  . SHOULDER SURGERY     left rotator cuff  . TOTAL KNEE ARTHROPLASTY  02/2014   Left knee   Family History  Problem Relation Age of Onset  . COPD Mother   . Hypertension Father   . Hepatitis Sister        Hepatitis C from a dental procedure   History  Sexual Activity  . Sexual activity: Not on file    Outpatient Encounter Prescriptions as of 12/21/2016  Medication Sig  . aspirin 81 MG tablet Take 81 mg by mouth 2 (two) times daily.  Marland Kitchen buPROPion (WELLBUTRIN SR) 100 MG 12 hr tablet Take 1 tablet (100 mg total) by  mouth 2 (two) times daily.  . Calcium Carbonate-Vitamin D (CALCIUM-VITAMIN D) 500-200 MG-UNIT per tablet Take 1 tablet by mouth 2 (two) times daily with a meal.    . cetirizine (ZYRTEC) 10 MG tablet Take 10 mg by mouth daily.    . cycloSPORINE (RESTASIS) 0.05 % ophthalmic emulsion Place 1 drop into both eyes 2 (two) times daily.  Marland Kitchen docusate sodium (COLACE) 100 MG capsule Take 100 mg by mouth 2 (two) times daily.  . Doxylamine Succinate, Sleep, (SLEEP AID PO) Take by mouth.  . hydrochlorothiazide 25 MG tablet Take 25 mg by mouth daily.    . metoprolol succinate (TOPROL-XL) 25 MG 24 hr tablet Take 25 mg by mouth daily.  . Multiple Vitamin (MULTIVITAMIN) tablet Take 1 tablet by mouth daily.    . [DISCONTINUED] azithromycin (ZITHROMAX) 500 MG tablet Take 1 tablet (500 mg total) by mouth daily.  . [DISCONTINUED] chlorpheniramine-HYDROcodone (TUSSIONEX PENNKINETIC ER) 10-8 MG/5ML SUER Take 5 mLs by mouth at bedtime as needed for cough.  . [DISCONTINUED] Insulin Pen Needle (PEN NEEDLES) 31G X 6 MM MISC For use with victoza /saxenda  . [  DISCONTINUED] Liraglutide -Weight Management (SAXENDA) 18 MG/3ML SOPN Inject 0.6 mg into the skin daily. Increase dose weekly as follows: Week 2: 1.2 mg daily ; Week 3: 1.8 mg daily; Week 4: 2.4 mg daily  . [DISCONTINUED] ondansetron (ZOFRAN) 4 MG tablet Take 1 tablet (4 mg total) by mouth daily as needed for nausea or vomiting.  . [DISCONTINUED] polyethylene glycol (MIRALAX / GLYCOLAX) packet Take 17 g by mouth daily.  . [DISCONTINUED] predniSONE (DELTASONE) 10 MG tablet 6 tablets on Day 1 , then reduce by 1 tablet daily until gone  . [DISCONTINUED] Tdap (BOOSTRIX) 5-2.5-18.5 LF-MCG/0.5 injection Inject 0.5 mLs into the muscle once.   No facility-administered encounter medications on file as of 12/21/2016.     Activities of Daily Living In your present state of health, do you have any difficulty performing the following activities: 12/21/2016  Hearing? N  Vision? N    Difficulty concentrating or making decisions? N  Walking or climbing stairs? N  Dressing or bathing? N  Doing errands, shopping? N  Preparing Food and eating ? N  Using the Toilet? N  In the past six months, have you accidently leaked urine? N  Do you have problems with loss of bowel control? N  Managing your Medications? N  Managing your Finances? N  Housekeeping or managing your Housekeeping? N  Some recent data might be hidden    Patient Care Team: Crecencio Mc, MD as PCP - General (Internal Medicine)    Assessment:    This is a routine wellness examination for Dylann. The goal of the wellness visit is to assist the patient how to close the gaps in care and create a preventative care plan for the patient.   The roster of all physicians providing medical care to patient is listed in the Snapshot section of the chart.  Taking calcium VIT D as appropriate/Osteoporosis risk reviewed.    Safety issues reviewed; Smoke and carbon monoxide detectors in the home. No firearms in the home.  Wears seatbelts when driving or riding with others. Patient does wear sunscreen or protective clothing when in direct sunlight. No violence in the home.  Patient is alert, normal appearance, oriented to person/place/and time.  Correctly identified the president of the [place], recall of 3/3 words, and performing simple calculations. Displays appropriate judgement and can read correct time from watch face.   No new identified risk were noted.  No failures at ADL's or IADL's.    BMI- discussed the importance of a healthy diet, water intake and the benefits of aerobic exercise. Educational material provided.   24 hour diet recall: low carb diet Breakfast: scrambled egg Lunch: egg salad Dinner: taco meat Snack: 8 chocolate covered almonds  Daily fluid intake: 0 cups of caffeine, 6 cups of water, green smoothie every other day  Dental- every 6 months. [facility].  Eye- Visual acuity not assessed  per patient preference since they have regular follow up with the ophthalmologist.  Wears corrective lenses.  Sleep patterns- Sleeps 8 hours at night.  Wakes feeling rested. CPAP in use.  Dexa Scan ordered at [facility] per patient request; follow as directed. Educational material provided.  Influenza and Pneumovax 23 vaccine to be administered at her local pharmacy.  Follow up with historical documentation once completed.    Patient Concerns: None at this time. Follow up with PCP as needed.  Exercise Activities and Dietary recommendations Current Exercise Habits: Structured exercise class, Type of exercise: yoga;walking (Cancer Yoga), Frequency (Times/Week): 2, Intensity:  Mild  Goals    . Increase physical activity          Continue to increase exercise as tolerated    . Increase water intake          Stay hydrated    . Reduce portion size      Fall Risk Fall Risk  12/21/2016 01/05/2016 10/28/2014 12/25/2012  Falls in the past year? No No No No   Depression Screen PHQ 2/9 Scores 12/21/2016 01/05/2016 08/03/2015 10/28/2014  PHQ - 2 Score 1 0 2 0  PHQ- 9 Score 2 - 5 -     Cognitive Function MMSE - Mini Mental State Exam 12/21/2016  Orientation to time 5  Orientation to Place 5  Registration 3  Attention/ Calculation 5  Recall 3  Language- name 2 objects 2  Language- repeat 1  Language- follow 3 step command 3  Language- read & follow direction 1  Write a sentence 1  Copy design 1  Total score 30        Immunization History  Administered Date(s) Administered  . Influenza Split 02/23/2011, 02/20/2012  . Influenza Whole 02/04/2016  . Influenza, High Dose Seasonal PF 01/01/2015  . Influenza,inj,Quad PF,6+ Mos 12/25/2012  . Pneumococcal Conjugate-13 09/20/2013  . Tdap 12/27/2013  . Zoster 12/26/2007   Screening Tests Health Maintenance  Topic Date Due  . DEXA SCAN  10/08/2012  . PNA vac Low Risk Adult (2 of 2 - PPSV23) 09/21/2014  . INFLUENZA VACCINE  10/20/2016  .  COLONOSCOPY  10/28/2022  . TETANUS/TDAP  12/28/2023  . Hepatitis C Screening  Completed  . MAMMOGRAM  Excluded      Plan:    End of life planning; Advance aging; Advanced directives discussed. Copy of current HCPOA/Living Will requested.    I have personally reviewed and noted the following in the patient's chart:   . Medical and social history . Use of alcohol, tobacco or illicit drugs  . Current medications and supplements . Functional ability and status . Nutritional status . Physical activity . Advanced directives . List of other physicians . Hospitalizations, surgeries, and ER visits in previous 12 months . Vitals . Screenings to include cognitive, depression, and falls . Referrals and appointments  In addition, I have reviewed and discussed with patient certain preventive protocols, quality metrics, and best practice recommendations. A written personalized care plan for preventive services as well as general preventive health recommendations were provided to patient.     Varney Biles, LPN  16/03/958   Agree with plan. Mable Paris, NP

## 2016-12-30 ENCOUNTER — Ambulatory Visit (INDEPENDENT_AMBULATORY_CARE_PROVIDER_SITE_OTHER): Payer: Medicare Other

## 2016-12-30 ENCOUNTER — Ambulatory Visit (INDEPENDENT_AMBULATORY_CARE_PROVIDER_SITE_OTHER): Payer: Medicare Other | Admitting: Internal Medicine

## 2016-12-30 ENCOUNTER — Encounter: Payer: Self-pay | Admitting: Internal Medicine

## 2016-12-30 VITALS — BP 142/80 | HR 81 | Temp 98.7°F | Resp 15 | Ht 67.0 in | Wt 224.2 lb

## 2016-12-30 DIAGNOSIS — R05 Cough: Secondary | ICD-10-CM

## 2016-12-30 DIAGNOSIS — E669 Obesity, unspecified: Secondary | ICD-10-CM

## 2016-12-30 DIAGNOSIS — F320 Major depressive disorder, single episode, mild: Secondary | ICD-10-CM

## 2016-12-30 DIAGNOSIS — R7301 Impaired fasting glucose: Secondary | ICD-10-CM | POA: Diagnosis not present

## 2016-12-30 DIAGNOSIS — R053 Chronic cough: Secondary | ICD-10-CM

## 2016-12-30 DIAGNOSIS — C679 Malignant neoplasm of bladder, unspecified: Secondary | ICD-10-CM

## 2016-12-30 LAB — POCT GLYCOSYLATED HEMOGLOBIN (HGB A1C): Hemoglobin A1C: 5.7

## 2016-12-30 MED ORDER — ALPRAZOLAM 0.25 MG PO TABS: 0.2500 mg | ORAL_TABLET | Freq: Two times a day (BID) | ORAL | 4 refills | Status: DC | PRN

## 2016-12-30 NOTE — Progress Notes (Signed)
Subjective:  Patient ID: Heidi Walsh, female    DOB: 07/13/1945  Age: 70 y.o. MRN: [id number]  CC: The primary encounter diagnosis was Cough, persistent. Diagnoses of Impaired fasting glucose, Malignant neoplasm of urinary bladder, unspecified site ([place]), Obesity (BMI 30-39.9), and Current mild episode of major depressive disorder without prior episode [facility]) were also pertinent to this visit.  HPI Heidi Walsh presents for follow up on multiple issues    Reported gross hematuria in June. :  Bladder tumor found in late August managed by [facility].  S/p  TURBT  And chemowash done in mid September. Which required a double J ureteral stent which made her miserable. for ten days and chemo . Loves her surgeon Borowski   Stent was removed sept 20th   Next cysto Dec 20th with gemcitabine wash   Mood:  Stopped wellbutrin cold turkiey 5 days ago , wasn't  helping "just don't feel good about myself"  ; the fact that she has had 2 primary cancers getting to her .  Struggling with her weight.  30 lb gain since 2014 . Gave in to indulgence/cravings  on chocolate covered almonds.   Tearful  Has insomnia every 3'rd night   Lab Results  Component Value Date   HGBA1C 5.7 12/30/2016      Outpatient Medications Prior to Visit  Medication Sig Dispense Refill  . aspirin 81 MG tablet Take 81 mg by mouth 2 (two) times daily.    Marland Kitchen buPROPion (WELLBUTRIN SR) 100 MG 12 hr tablet Take 1 tablet (100 mg total) by mouth 2 (two) times daily. 180 tablet 5  . Calcium Carbonate-Vitamin D (CALCIUM-VITAMIN D) 500-200 MG-UNIT per tablet Take 1 tablet by mouth 2 (two) times daily with a meal.      . cetirizine (ZYRTEC) 10 MG tablet Take 10 mg by mouth daily.      . cycloSPORINE (RESTASIS) 0.05 % ophthalmic emulsion Place 1 drop into both eyes 2 (two) times daily.    Marland Kitchen docusate sodium (COLACE) 100 MG capsule Take 100 mg by mouth 2 (two) times daily.    . hydrochlorothiazide 25 MG tablet Take 25 mg by mouth  daily.      . metoprolol succinate (TOPROL-XL) 25 MG 24 hr tablet Take 25 mg by mouth daily.    . Multiple Vitamin (MULTIVITAMIN) tablet Take 1 tablet by mouth daily.      . Doxylamine Succinate, Sleep, (SLEEP AID PO) Take by mouth.     No facility-administered medications prior to visit.     Review of Systems;  Patient denies headache, fevers, malaise, unintentional weight loss, skin rash, eye pain, sinus congestion and sinus pain, sore throat, dysphagia,  hemoptysis , cough, dyspnea, wheezing, chest pain, palpitations, orthopnea, edema, abdominal pain, nausea, melena, diarrhea, constipation, flank pain, dysuria, hematuria, urinary  Frequency, nocturia, numbness, tingling, seizures,  Focal weakness, Loss of consciousness,  Tremor, insomnia, depression, anxiety, and suicidal ideation.      Objective:  BP (!) 142/80 (BP Location: Right Arm, Patient Position: Sitting, Cuff Size: Large)   Pulse 81   Temp 98.7 F (37.1 C) (Oral)   Resp 15   Ht 5\' 7"  (1.702 m)   Wt 224 lb 3.2 oz (101.7 kg)   SpO2 97%   BMI 35.11 kg/m   BP Readings from Last 3 Encounters:  12/30/16 (!) 142/80  12/21/16 128/78  04/12/16 116/76    Wt Readings from Last 3 Encounters:  12/30/16 224 lb 3.2 oz (101.7 kg)  12/21/16 220 lb 6.4 oz (100 kg)  04/12/16 212 lb 3.2 oz (96.3 kg)    General appearance: alert, cooperative and appears stated age Ears: normal TM's and external ear canals both ears Throat: lips, mucosa, and tongue normal; teeth and gums normal Neck: no adenopathy, no carotid bruit, supple, symmetrical, trachea midline and thyroid not enlarged, symmetric, no tenderness/mass/nodules Back: symmetric, no curvature. ROM normal. No CVA tenderness. Lungs: clear to auscultation bilaterally Heart: regular rate and rhythm, S1, S2 normal, no murmur, click, rub or gallop Abdomen: soft, non-tender; bowel sounds normal; no masses,  no organomegaly Pulses: 2+ and symmetric Skin: Skin color, texture, turgor  normal. No rashes or lesions Lymph nodes: Cervical, supraclavicular, and axillary nodes normal.  Lab Results  Component Value Date   HGBA1C 5.7 12/30/2016   HGBA1C 5.9 01/11/2013    Lab Results  Component Value Date   CREATININE 0.74 08/20/2015   CREATININE 0.69 11/06/2014   CREATININE 0.7 01/11/2013    Lab Results  Component Value Date   WBC 4.7 08/20/2015   HGB 15.0 08/20/2015   HCT 42.7 08/20/2015   PLT 188 08/20/2015   GLUCOSE 97 08/20/2015   CHOL 197 11/06/2014   TRIG 138.0 11/06/2014   HDL 52.50 11/06/2014   LDLDIRECT 146.4 01/11/2013   LDLCALC 117 (H) 11/06/2014   ALT 39 08/20/2015   AST 28 08/20/2015   NA 138 08/20/2015   K 3.3 (L) 08/20/2015   CL 102 08/20/2015   CREATININE 0.74 08/20/2015   BUN 13 08/20/2015   CO2 24 08/20/2015   TSH 2.050 08/01/2015   HGBA1C 5.7 12/30/2016    Dg Abd 2 Views  Result Date: 08/20/2015 CLINICAL DATA:  Lower abdominal pain and nausea 1 day. EXAM: ABDOMEN - 2 VIEW COMPARISON:  None. FINDINGS: Bowel gas pattern is nonobstructive with mild fecal retention throughout the colon. No free peritoneal air. No mass or mass effect. No abnormal calcifications within the abdomen. Multiple surgical clips are present over the lower chest bilaterally. Several calcifications are present over the pelvis likely phleboliths. There mild degenerate changes of the spine and right hip. 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 IMPRESSION: Nonobstructive bowel gas pattern with mild fecal retention throughout the colon. Electronically Signed   By: Marin Olp M.D.   On: 08/20/2015 15:36    Assessment & Plan:   Problem List Items Addressed This Visit    Bladder cancer ([place])    1 cm papillar tumor covering   left UO Resected at [facility] Sept 6 by Stephens Shire and ureteral stent placed.  Stent removed sept 20.  Repeat cystoscopy 3 months [facility]         Relevant Medications   ALPRAZolam (XANAX) 0.25 MG tablet   Major depressive disorder, single  episode    Symptoms  now are more representative of anxiety with insomnia than depression and were not relieved with wellbutrin ,so she stopped it.  Given rx fror alprazolam for prn use.       Relevant Medications   ALPRAZolam (XANAX) 0.25 MG tablet   Obesity (BMI 30-39.9)    Previous trial of Saxenda not successful due to . Intolerance.  Wants to try contrave.         Other Visit Diagnoses    Cough, persistent    -  Primary   Relevant Orders   DG Chest 2 View (Completed)   Impaired fasting glucose       Relevant Orders   POCT HgB A1C (Completed)  A total of 40 minutes of face to face time was spent with patient more than half of which was spent in counselling and coordination of care    I have discontinued Ms. Mogan's (Doxylamine Succinate, Sleep, (SLEEP AID PO)). I am also having her start on ALPRAZolam. Additionally, I am having her maintain her hydrochlorothiazide, cetirizine, multivitamin, calcium-vitamin D, aspirin, metoprolol succinate, docusate sodium, buPROPion, and cycloSPORINE.  Meds ordered this encounter  Medications  . ALPRAZolam (XANAX) 0.25 MG tablet    Sig: Take 1 tablet (0.25 mg total) by mouth 2 (two) times daily as needed for anxiety.    Dispense:  60 tablet    Refill:  4    Medications Discontinued During This Encounter  Medication Reason  . Doxylamine Succinate, Sleep, (SLEEP AID PO) Patient has not taken in last 30 days    Follow-up: No Follow-up on file.   Crecencio Mc, MD

## 2016-12-30 NOTE — Patient Instructions (Addendum)
I recommend  A Trial of alprazolam to use as needed for your anxiety .  You can use it at night and during the day as needed   We can  (Contrave ) which is wellbutrin /naltrexone if you decide you want to try it

## 2017-01-01 NOTE — Assessment & Plan Note (Signed)
1 cm papillar tumor covering   left UO Resected at [facility] Sept 6 by Stephens Shire and ureteral stent placed.  Stent removed sept 20.  Repeat cystoscopy 3 months [facility]

## 2017-01-01 NOTE — Assessment & Plan Note (Addendum)
Symptoms  now are more representative of anxiety with insomnia than depression and were not relieved with wellbutrin ,so she stopped it.  Given rx fror alprazolam for prn use.

## 2017-01-01 NOTE — Assessment & Plan Note (Addendum)
Previous trial of Saxenda not successful due to . Intolerance.  Wants to try contrave.

## 2017-04-26 ENCOUNTER — Encounter: Payer: Self-pay | Admitting: Internal Medicine

## 2017-04-26 ENCOUNTER — Other Ambulatory Visit: Payer: Self-pay | Admitting: Internal Medicine

## 2017-04-26 MED ORDER — BUPROPION HCL ER (SR) 200 MG PO TB12: 100.0000 mg | ORAL_TABLET | Freq: Two times a day (BID) | ORAL | 1 refills | Status: DC

## 2017-05-06 ENCOUNTER — Ambulatory Visit: Payer: Medicare Other | Admitting: Family

## 2017-05-06 ENCOUNTER — Encounter: Payer: Self-pay | Admitting: Family

## 2017-05-06 VITALS — BP 122/74 | HR 83 | Temp 99.7°F | Wt 226.0 lb

## 2017-05-06 DIAGNOSIS — J40 Bronchitis, not specified as acute or chronic: Secondary | ICD-10-CM | POA: Diagnosis not present

## 2017-05-06 MED ORDER — HYDROCODONE-HOMATROPINE 5-1.5 MG/5ML PO SYRP: 5.0000 mL | ORAL_SOLUTION | Freq: Every evening | ORAL | 0 refills | Status: DC | PRN

## 2017-05-06 MED ORDER — AZITHROMYCIN 250 MG PO TABS: ORAL_TABLET | ORAL | 0 refills | Status: DC

## 2017-05-06 NOTE — Patient Instructions (Signed)
mucinex  [place] of water  Let me know how you are doing

## 2017-05-06 NOTE — Progress Notes (Signed)
Subjective:    Patient ID: Heidi Walsh, female    DOB: 04-Nov-1945, 72 y.o.   MRN: [id number]  CC: Heidi Walsh is a 72 y.o. female who presents today for an acute visit.    HPI: CC: congestion, dry cough x 2 days, unchanged. Endorses myalgia, low grade fever, headache, sinus pressure, chills, fever ( tmax 100). Tried tussin OTC, tyelonol with some relief.        HISTORY:  Past Medical History:  Diagnosis Date  . Actinic keratosis    managed by Darlis Loan  . Atrial fibrillation [facility]) 2010   Followed by Dr. Jordan Hawks  . breast cancer 2010   s/p mastectomy  . Breast cyst 2003   aspirated, fibroystics breasts with stable calcifications on left breast  . Leukopenia   . Mitral regurgitation    and prolapse  . Osteopenia 56   Stable by repeat scans   Past Surgical History:  Procedure Laterality Date  . ATRIAL ABLATION SURGERY  Dec 2012   [facility]  . CESAREAN SECTION     x 2  . JOINT REPLACEMENT    . KNEE ARTHROSCOPY W/ MENISCECTOMY  1997  . MASTECTOMY  02/2009   Bilateral  . MASTECTOMY     Right and Left  . SHOULDER SURGERY     left rotator cuff  . TOTAL KNEE ARTHROPLASTY  02/2014   Left knee   Family History  Problem Relation Age of Onset  . COPD Mother   . Hypertension Father   . Hepatitis Sister        Hepatitis C from a dental procedure    Allergies: Oxycodone; Penicillins; Ancef [cefazolin]; Cephalexin; and Rivaroxaban Current Outpatient Medications on File Prior to Visit  Medication Sig Dispense Refill  . ALPRAZolam (XANAX) 0.25 MG tablet Take 1 tablet (0.25 mg total) by mouth 2 (two) times daily as needed for anxiety. 60 tablet 4  . aspirin 81 MG tablet Take 81 mg by mouth 2 (two) times daily.    Marland Kitchen buPROPion (WELLBUTRIN SR) 200 MG 12 hr tablet Take 1 tablet (200 mg total) by mouth 2 (two) times daily. 180 tablet 1  . Calcium Carbonate-Vitamin D (CALCIUM-VITAMIN D) 500-200 MG-UNIT per tablet Take 1 tablet by mouth 2 (two) times daily with a meal.       . cetirizine (ZYRTEC) 10 MG tablet Take 10 mg by mouth daily.      . cycloSPORINE (RESTASIS) 0.05 % ophthalmic emulsion Place 1 drop into both eyes 2 (two) times daily.    Marland Kitchen docusate sodium (COLACE) 100 MG capsule Take 100 mg by mouth 2 (two) times daily.    . hydrochlorothiazide 25 MG tablet Take 25 mg by mouth daily.      . metoprolol succinate (TOPROL-XL) 25 MG 24 hr tablet Take 25 mg by mouth daily.    . Multiple Vitamin (MULTIVITAMIN) tablet Take 1 tablet by mouth daily.       No current facility-administered medications on file prior to visit.     Social History   Tobacco Use  . Smoking status: Never Smoker  . Smokeless tobacco: Never Used  Substance Use Topics  . Alcohol use: Yes    Alcohol/week: 0.0 oz    Comment: cup of winw three times a week   . Drug use: No    Review of Systems  Constitutional: Positive for chills and fever.  HENT: Positive for congestion, sinus pressure and sore throat.   Respiratory: Positive for cough. Negative  for shortness of breath and wheezing.   Cardiovascular: Negative for chest pain and palpitations.  Gastrointestinal: Negative for nausea and vomiting.  Genitourinary: Negative for dysuria.  Neurological: Positive for headaches.      Objective:    BP 122/74 (BP Location: Right Arm, Patient Position: Sitting, Cuff Size: Large)   Pulse 83   Temp 99.7 F (37.6 C) (Oral)   Wt 226 lb (102.5 kg)   SpO2 95%   BMI 35.40 kg/m    Physical Exam  Constitutional: She appears well-developed and well-nourished.  HENT:  Head: Normocephalic and atraumatic.  Right Ear: Hearing, tympanic membrane, external ear and ear canal normal. No drainage, swelling or tenderness. No foreign bodies. Tympanic membrane is not erythematous and not bulging. No middle ear effusion. No decreased hearing is noted.  Left Ear: Hearing, tympanic membrane, external ear and ear canal normal. No drainage, swelling or tenderness. No foreign bodies. Tympanic membrane is not  erythematous and not bulging.  No middle ear effusion. No decreased hearing is noted.  Nose: No rhinorrhea. Right sinus exhibits maxillary sinus tenderness. Right sinus exhibits no frontal sinus tenderness. Left sinus exhibits maxillary sinus tenderness. Left sinus exhibits no frontal sinus tenderness.  Mouth/Throat: Uvula is midline, oropharynx is clear and moist and mucous membranes are normal. No oropharyngeal exudate, posterior oropharyngeal edema, posterior oropharyngeal erythema or tonsillar abscesses.  Eyes: Conjunctivae are normal.  Cardiovascular: Regular rhythm, normal heart sounds and normal pulses.  Pulmonary/Chest: Effort normal and breath sounds normal. She has no wheezes. She has no rhonchi. She has no rales.  Lymphadenopathy:       Head (right side): No submental, no submandibular, no tonsillar, no preauricular, no posterior auricular and no occipital adenopathy present.       Head (left side): No submental, no submandibular, no tonsillar, no preauricular, no posterior auricular and no occipital adenopathy present.    She has no cervical adenopathy.  Neurological: She is alert.  Skin: Skin is warm and dry.  Psychiatric: She has a normal mood and affect. Her speech is normal and behavior is normal. Thought content normal.  Vitals reviewed.      Assessment & Plan:   Bronchitis - Plan: HYDROcodone-homatropine (HYCODAN) 5-1.5 MG/5ML syrup, azithromycin (ZITHROMAX) 250 MG tablet Non toxic appearing. No acute respiratory distress.Low grade fever. Outside of window to treat with tamiflu so decided not to test for flu. Based on acute onset of symptoms and low grade fever, will treat with azithromycin. She will let me know if not better    I am having Heidi Walsh maintain her hydrochlorothiazide, cetirizine, multivitamin, calcium-vitamin D, aspirin, metoprolol succinate, docusate sodium, cycloSPORINE, ALPRAZolam, and buPROPion.   No orders of the defined types were placed in this  encounter.   Return precautions given.   Risks, benefits, and alternatives of the medications and treatment plan prescribed today were discussed, and patient expressed understanding.   Education regarding symptom management and diagnosis given to patient on AVS.  Continue to follow with Crecencio Mc, MD for routine health maintenance.   Heidi Walsh and I agreed with plan.   Mable Paris, FNP

## 2017-05-10 ENCOUNTER — Other Ambulatory Visit: Payer: Self-pay | Admitting: Family

## 2017-05-10 DIAGNOSIS — Z20828 Contact with and (suspected) exposure to other viral communicable diseases: Secondary | ICD-10-CM

## 2017-05-10 MED ORDER — OSELTAMIVIR PHOSPHATE 75 MG PO CAPS: 75.0000 mg | ORAL_CAPSULE | Freq: Two times a day (BID) | ORAL | 0 refills | Status: DC

## 2017-05-10 NOTE — Telephone Encounter (Signed)
Per patients husband go ahead and send in script

## 2017-05-10 NOTE — Telephone Encounter (Signed)
Call pt  Have seen husband today and he has flu ( he stated okay to let wife know).  I may have tamiflu for her if she would like to start for empiric treatment due to exposure.  If she would like, please send in tamiflu

## 2017-05-25 ENCOUNTER — Encounter: Payer: Self-pay | Admitting: Internal Medicine

## 2017-05-25 DIAGNOSIS — Z780 Asymptomatic menopausal state: Secondary | ICD-10-CM

## 2017-05-26 ENCOUNTER — Other Ambulatory Visit: Payer: Self-pay | Admitting: Internal Medicine

## 2017-05-26 DIAGNOSIS — M21612 Bunion of left foot: Secondary | ICD-10-CM

## 2017-05-26 DIAGNOSIS — M2042 Other hammer toe(s) (acquired), left foot: Secondary | ICD-10-CM

## 2017-06-06 ENCOUNTER — Telehealth: Payer: Self-pay

## 2017-06-06 ENCOUNTER — Encounter: Payer: Self-pay | Admitting: Internal Medicine

## 2017-06-06 NOTE — Telephone Encounter (Signed)
Copied from [place]. Topic: Referral - Request >> Jun 06, 2017 12:32 PM Neva Seat wrote: Pt still needing referrals ASAP  [facility] in [place] to be faxed in.  [facility] in [place].

## 2017-06-07 NOTE — Telephone Encounter (Signed)
LMTCB. Please transfer pt to our office.  

## 2017-06-14 NOTE — Telephone Encounter (Signed)
Yes, please do.

## 2017-06-14 NOTE — Telephone Encounter (Signed)
Spoke with pt and she stated that she has an appt with [facility] in [place] today, but that she still needs an ordered faxed to [place] for a bone density scan. Is it okay to go ahead and order?

## 2017-06-15 NOTE — Telephone Encounter (Signed)
Order has been printed, signed and faxed to [facility].

## 2017-06-24 LAB — HM DEXA SCAN: HM DEXA SCAN: NORMAL

## 2017-06-27 ENCOUNTER — Telehealth: Payer: Self-pay | Admitting: Internal Medicine

## 2017-06-27 NOTE — Telephone Encounter (Signed)
MyChart message sent   You have mild osteopenia by your recent bone density test,  meaning that you have had some bone loss,  but not enough to call it osteoporosis.  Nevertheless, your  risk of fracture in the next  10 yrs is about slightly elevated, so I recommend weight bearing exercise, a goal intake of 1200 mg calcium through diet /supplements ,  A minimum of  1000 units Vit D daily  And we will repeat your DEXA scan in 5 years . If there has been significant progression, we will discuss medical therapy   I recommend getting the majority of your calcium and Vitamin D  through dietary sources rather than supplements given the recent association of calcium supplements with increased coronary artery calcium scores    .

## 2017-10-10 ENCOUNTER — Other Ambulatory Visit: Payer: Self-pay | Admitting: Internal Medicine

## 2017-11-18 ENCOUNTER — Telehealth: Payer: Self-pay | Admitting: Internal Medicine

## 2017-11-18 NOTE — Telephone Encounter (Signed)
I received a preoperative clearance form from Heidi Walsh for hip replacement scheduled on Sept 12 2019  They are requesting labs , chest x ray and EKG.  She has not had labs since 2017!  Hasn't seen me sicne October,.  I canot complete the form without an office visit

## 2017-11-18 NOTE — Telephone Encounter (Signed)
There is no available appts before her surgery other than same day appts. Pt stated that she is seeing Cardiology on Wednesday and they will be doing the EKG. Is it okay to schedule pt on Thursday 11/24/2017 at 12pm?

## 2017-11-18 NOTE — Telephone Encounter (Signed)
LMTCB. Please transfer pt to our office.  

## 2017-11-18 NOTE — Telephone Encounter (Signed)
YES,  Thursday IS FINE

## 2017-11-18 NOTE — Telephone Encounter (Signed)
Left message with pt to confirm her appt for Thursday 11/24/2017 at 12pm.

## 2017-11-24 ENCOUNTER — Encounter: Payer: Self-pay | Admitting: Internal Medicine

## 2017-11-24 ENCOUNTER — Ambulatory Visit: Payer: Medicare Other | Admitting: Internal Medicine

## 2017-11-24 ENCOUNTER — Ambulatory Visit (INDEPENDENT_AMBULATORY_CARE_PROVIDER_SITE_OTHER): Payer: Medicare Other

## 2017-11-24 VITALS — BP 126/82 | HR 83 | Temp 97.8°F | Resp 18 | Ht 67.0 in | Wt 229.2 lb

## 2017-11-24 DIAGNOSIS — R918 Other nonspecific abnormal finding of lung field: Secondary | ICD-10-CM | POA: Diagnosis not present

## 2017-11-24 DIAGNOSIS — K760 Fatty (change of) liver, not elsewhere classified: Secondary | ICD-10-CM | POA: Diagnosis not present

## 2017-11-24 DIAGNOSIS — M1610 Unilateral primary osteoarthritis, unspecified hip: Secondary | ICD-10-CM

## 2017-11-24 DIAGNOSIS — Z01818 Encounter for other preprocedural examination: Secondary | ICD-10-CM

## 2017-11-24 DIAGNOSIS — R911 Solitary pulmonary nodule: Secondary | ICD-10-CM

## 2017-11-24 DIAGNOSIS — E559 Vitamin D deficiency, unspecified: Secondary | ICD-10-CM

## 2017-11-24 DIAGNOSIS — G4733 Obstructive sleep apnea (adult) (pediatric): Secondary | ICD-10-CM

## 2017-11-24 LAB — HEMOGLOBIN A1C: Hgb A1c MFr Bld: 5.8 % (ref 4.6–6.5)

## 2017-11-24 LAB — CBC WITH DIFFERENTIAL/PLATELET
Basophils Absolute: 0.1 10*3/uL (ref 0.0–0.1)
Basophils Relative: 1.4 % (ref 0.0–3.0)
EOS PCT: 5.8 % — AB (ref 0.0–5.0)
Eosinophils Absolute: 0.2 10*3/uL (ref 0.0–0.7)
HEMATOCRIT: 41.7 % (ref 36.0–46.0)
HEMOGLOBIN: 14.4 g/dL (ref 12.0–15.0)
Lymphocytes Relative: 36.1 % (ref 12.0–46.0)
Lymphs Abs: 1.5 10*3/uL (ref 0.7–4.0)
MCHC: 34.7 g/dL (ref 30.0–36.0)
MCV: 97.4 fl (ref 78.0–100.0)
MONOS PCT: 10.2 % (ref 3.0–12.0)
Monocytes Absolute: 0.4 10*3/uL (ref 0.1–1.0)
Neutro Abs: 1.9 10*3/uL (ref 1.4–7.7)
Neutrophils Relative %: 46.5 % (ref 43.0–77.0)
Platelets: 224 10*3/uL (ref 150.0–400.0)
RBC: 4.28 Mil/uL (ref 3.87–5.11)
RDW: 13 % (ref 11.5–15.5)
WBC: 4.2 10*3/uL (ref 4.0–10.5)

## 2017-11-24 LAB — BASIC METABOLIC PANEL
BUN: 12 mg/dL (ref 6–23)
CO2: 26 meq/L (ref 19–32)
Calcium: 9.5 mg/dL (ref 8.4–10.5)
Chloride: 103 mEq/L (ref 96–112)
Creatinine, Ser: 0.69 mg/dL (ref 0.40–1.20)
GFR: 89.37 mL/min (ref >60.00)
GLUCOSE: 101 mg/dL — AB (ref 70–99)
POTASSIUM: 3.2 meq/L — AB (ref 3.5–5.1)
SODIUM: 139 meq/L (ref 135–145)

## 2017-11-24 LAB — PROTIME-INR
INR: 1 ratio (ref 0.8–1.0)
Prothrombin Time: 11.6 s (ref 9.6–13.1)

## 2017-11-24 LAB — HEPATIC FUNCTION PANEL
ALBUMIN: 4.5 g/dL (ref 3.5–5.2)
ALK PHOS: 68 U/L (ref 39–117)
ALT: 27 U/L (ref 0–35)
AST: 23 U/L (ref 0–37)
BILIRUBIN DIRECT: 0.1 mg/dL (ref 0.0–0.3)
TOTAL PROTEIN: 7.5 g/dL (ref 6.0–8.3)
Total Bilirubin: 0.6 mg/dL (ref 0.2–1.2)

## 2017-11-24 LAB — VITAMIN D 25 HYDROXY (VIT D DEFICIENCY, FRACTURES): VITD: 32.44 ng/mL (ref 30.00–100.00)

## 2017-11-24 LAB — TSH: TSH: 2.3 u[IU]/mL (ref 0.35–4.50)

## 2017-11-24 MED ORDER — POTASSIUM CHLORIDE CRYS ER 20 MEQ PO TBCR: 20.0000 meq | EXTENDED_RELEASE_TABLET | Freq: Every day | ORAL | 0 refills | Status: DC

## 2017-11-24 MED ORDER — ALPRAZOLAM 0.25 MG PO TABS: 0.2500 mg | ORAL_TABLET | Freq: Two times a day (BID) | ORAL | 4 refills | Status: DC | PRN

## 2017-11-24 NOTE — Patient Instructions (Addendum)
I am ordering :  CBC PT/INR Vit D BMET (BMP) A1c TSH  And a chest xray

## 2017-11-24 NOTE — Progress Notes (Signed)
Subjective:  Patient ID: Heidi Walsh, female    DOB: 04/24/1945  Age: 72 y.o. MRN: [id number]  CC: The primary encounter diagnosis was Preoperative evaluation of a medical condition to rule out surgical contraindications (TAR required). Diagnoses of Vitamin D deficiency, Hepatic steatosis, Abnormal findings on diagnostic imaging of lung, Right upper lobe pulmonary nodule, Right upper lobe pulmonary infiltrate, Obstructive sleep apnea syndrome, and Primary localized osteoarthritis of pelvic region and thigh were also pertinent to this visit.  HPI Heidi Walsh presents for Preoperative medical clearance, requested by her orthopedist, Dr Melvyn Novas  for future right hip replacement planned for Sept 12 .  She has no history of CAD and has had no recent episodes of chest pain and has had a preoperative cardiology evaluation by Dr. Ubaldo Glassing recently.  She has OSA managed with CPAP.  She has a history of PAF,  Managed with metoprolol with no recent episodes .  And takes 162 mg aspirin daily  She is a breast cancer survivor  Complicated by lymphedema of the left arm,   She has  Impaired fasting glucose amd fatty liver with last a1c 5.7 in October .  She has no history of DVT, nephropathy , neuropathy , or PAD.  She is due for follow up labs.  She has had a slight nonproductive cough for several weeks  Without fevers, body aches,  And shortness of breath  Lab Results  Component Value Date   HGBA1C 5.8 11/24/2017     Outpatient Medications Prior to Visit  Medication Sig Dispense Refill  . hydrochlorothiazide 25 MG tablet Take 25 mg by mouth daily.      . metoprolol succinate (TOPROL-XL) 25 MG 24 hr tablet Take 25 mg by mouth daily.    Marland Kitchen aspirin 81 MG tablet Take 81 mg by mouth 2 (two) times daily.    Marland Kitchen buPROPion (WELLBUTRIN SR) 200 MG 12 hr tablet TAKE 1 TABLET BY MOUTH TWICE A DAY (Patient not taking: Reported on 11/24/2017) 180 tablet 0  . Calcium Carbonate-Vitamin D (CALCIUM-VITAMIN D) 500-200  MG-UNIT per tablet Take 1 tablet by mouth 2 (two) times daily with a meal.      . cetirizine (ZYRTEC) 10 MG tablet Take 10 mg by mouth daily.      . cycloSPORINE (RESTASIS) 0.05 % ophthalmic emulsion Place 1 drop into both eyes 2 (two) times daily.    Marland Kitchen docusate sodium (COLACE) 100 MG capsule Take 100 mg by mouth 2 (two) times daily.    Marland Kitchen HYDROcodone-homatropine (HYCODAN) 5-1.5 MG/5ML syrup Take 5 mLs by mouth at bedtime as needed for cough. (Patient not taking: Reported on 11/24/2017) 30 mL 0  . Multiple Vitamin (MULTIVITAMIN) tablet Take 1 tablet by mouth daily.      Marland Kitchen oseltamivir (TAMIFLU) 75 MG capsule Take 1 capsule (75 mg total) by mouth 2 (two) times daily. (Patient not taking: Reported on 11/24/2017) 10 capsule 0  . ALPRAZolam (XANAX) 0.25 MG tablet Take 1 tablet (0.25 mg total) by mouth 2 (two) times daily as needed for anxiety. (Patient not taking: Reported on 11/24/2017) 60 tablet 4  . azithromycin (ZITHROMAX) 250 MG tablet Tale 500 mg PO on day 1, then 250 mg PO q24h x 4 days. (Patient not taking: Reported on 11/24/2017) 6 tablet 0   No facility-administered medications prior to visit.     Review of Systems;  Patient denies headache, fevers, malaise, unintentional weight loss, skin rash, eye pain, sinus congestion and sinus pain, sore throat, dysphagia,  hemoptysis , cough, dyspnea, wheezing, chest pain, palpitations, orthopnea, edema, abdominal pain, nausea, melena, diarrhea, constipation, flank pain, dysuria, hematuria, urinary  Frequency, nocturia, numbness, tingling, seizures,  Focal weakness, Loss of consciousness,  Tremor, insomnia, depression, anxiety, and suicidal ideation.      Objective:  BP 126/82 (BP Location: Right Arm, Patient Position: Sitting, Cuff Size: Large)   Pulse 83   Temp 97.8 F (36.6 C) (Oral)   Resp 18   Ht 5\' 7"  (1.702 m)   Wt 229 lb 4 oz (104 kg)   SpO2 98%   BMI 35.91 kg/m   BP Readings from Last 3 Encounters:  11/24/17 126/82  05/06/17 122/74    12/30/16 (!) 142/80    Wt Readings from Last 3 Encounters:  11/24/17 229 lb 4 oz (104 kg)  05/06/17 226 lb (102.5 kg)  12/30/16 224 lb 3.2 oz (101.7 kg)    General appearance: alert, cooperative and appears stated age Ears: normal TM's and external ear canals both ears Throat: lips, mucosa, and tongue normal; teeth and gums normal Neck: no adenopathy, no carotid bruit, supple, symmetrical, trachea midline and thyroid not enlarged, symmetric, no tenderness/mass/nodules Back: symmetric, no curvature. ROM normal. No CVA tenderness. Lungs: clear to auscultation bilaterally Heart: regular rate and rhythm, S1, S2 normal, no murmur, click, rub or gallop Abdomen: soft, non-tender; bowel sounds normal; no masses,  no organomegaly Pulses: 2+ and symmetric Skin: Skin color, texture, turgor normal. No rashes or lesions Lymph nodes: Cervical, supraclavicular, and axillary nodes normal.  Lab Results  Component Value Date   HGBA1C 5.8 11/24/2017   HGBA1C 5.7 12/30/2016   HGBA1C 5.9 01/11/2013    Lab Results  Component Value Date   CREATININE 0.69 11/24/2017   CREATININE 0.74 08/20/2015   CREATININE 0.69 11/06/2014    Lab Results  Component Value Date   WBC 4.2 11/24/2017   HGB 14.4 11/24/2017   HCT 41.7 11/24/2017   PLT 224.0 11/24/2017   GLUCOSE 101 (H) 11/24/2017   CHOL 197 11/06/2014   TRIG 138.0 11/06/2014   HDL 52.50 11/06/2014   LDLDIRECT 146.4 01/11/2013   LDLCALC 117 (H) 11/06/2014   ALT 27 11/24/2017   AST 23 11/24/2017   NA 139 11/24/2017   K 3.2 (L) 11/24/2017   CL 103 11/24/2017   CREATININE 0.69 11/24/2017   BUN 12 11/24/2017   CO2 26 11/24/2017   TSH 2.30 11/24/2017   INR 1.0 11/24/2017   HGBA1C 5.8 11/24/2017    Dg Abd 2 Views  Result Date: 08/20/2015 CLINICAL DATA:  Lower abdominal pain and nausea 1 day. EXAM: ABDOMEN - 2 VIEW COMPARISON:  None. FINDINGS: Bowel gas pattern is nonobstructive with mild fecal retention throughout the colon. No free  peritoneal air. No mass or mass effect. No abnormal calcifications within the abdomen. Multiple surgical clips are present over the lower chest bilaterally. Several calcifications are present over the pelvis likely phleboliths. There mild degenerate changes of the spine and right hip. 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 IMPRESSION: Nonobstructive bowel gas pattern with mild fecal retention throughout the colon. Electronically Signed   By: Marin Olp M.D.   On: 08/20/2015 15:36    Assessment & Plan:   Problem List Items Addressed This Visit    Hepatic steatosis   Relevant Orders   Hepatic function panel (Completed)   Preoperative evaluation of a medical condition to rule out surgical contraindications (TAR required) - Primary    A right upper lobe density was noted on today's screening  chest x ray.  She was sent for chest CT with contrast ,  Given her history of breast cancer.   She will be treated for atypical pneumonia with 5 day course of azithromycin given her slight cough.  She had a clear llung exam and normal vital signs. Therefore I am recommending that she has no contraindications to surgery on her hip next week.       Relevant Orders   Basic metabolic panel (Completed)   CBC with Differential/Platelet (Completed)   Hemoglobin A1c (Completed)   INR/PT (Completed)   DG Chest 2 View (Completed)   TSH (Completed)   Primary localized osteoarthritis of pelvic region and thigh    Right hip replacement has been planned.       Right upper lobe pulmonary infiltrate    Bronchiectasis and scarring vs inflammation/infection was suggested.  Given her report of recent cough, will treat for atypical pneumonia with azithromycin       Sleep apnea    She uses a portable CPAP with travel but will be bringing her home CPAP unit to her surgery        Other Visit Diagnoses    Vitamin D deficiency       Relevant Orders   VITAMIN D 25 Hydroxy (Vit-D Deficiency, Fractures) (Completed)   Abnormal  findings on diagnostic imaging of lung       Relevant Orders   CT Chest W Contrast (Completed)   Right upper lobe pulmonary nodule       Relevant Orders   CT Chest W Contrast (Completed)     A total of 40 minutes was spent with patient more than half of which was spent in counseling patient on the above mentioned issues , reviewing and explaining recent labs and imaging studies done, and coordination of care.  I am having Alba Destine start on potassium chloride SA. I am also having her maintain her hydrochlorothiazide, cetirizine, multivitamin, calcium-vitamin D, aspirin, metoprolol succinate, docusate sodium, cycloSPORINE, HYDROcodone-homatropine, oseltamivir, buPROPion, and ALPRAZolam.  Meds ordered this encounter  Medications  . ALPRAZolam (XANAX) 0.25 MG tablet    Sig: Take 1 tablet (0.25 mg total) by mouth 2 (two) times daily as needed for anxiety.    Dispense:  60 tablet    Refill:  4  . potassium chloride SA (K-DUR,KLOR-CON) 20 MEQ tablet    Sig: Take 1 tablet (20 mEq total) by mouth daily.    Dispense:  30 tablet    Refill:  0    Medications Discontinued During This Encounter  Medication Reason  . ALPRAZolam (XANAX) 0.25 MG tablet Reorder    Follow-up: No follow-ups on file.   Crecencio Mc, MD

## 2017-11-25 ENCOUNTER — Ambulatory Visit
Admission: RE | Admit: 2017-11-25 | Discharge: 2017-11-25 | Disposition: A | Discharge status: Home / Self Care | Payer: Medicare Other | Source: Ambulatory Visit | Attending: Internal Medicine | Admitting: Internal Medicine

## 2017-11-25 ENCOUNTER — Other Ambulatory Visit: Payer: Self-pay | Admitting: Internal Medicine

## 2017-11-25 DIAGNOSIS — J479 Bronchiectasis, uncomplicated: Secondary | ICD-10-CM | POA: Diagnosis not present

## 2017-11-25 DIAGNOSIS — K760 Fatty (change of) liver, not elsewhere classified: Secondary | ICD-10-CM | POA: Diagnosis not present

## 2017-11-25 DIAGNOSIS — Z853 Personal history of malignant neoplasm of breast: Secondary | ICD-10-CM | POA: Diagnosis not present

## 2017-11-25 DIAGNOSIS — R911 Solitary pulmonary nodule: Secondary | ICD-10-CM | POA: Diagnosis not present

## 2017-11-25 DIAGNOSIS — R918 Other nonspecific abnormal finding of lung field: Secondary | ICD-10-CM | POA: Insufficient documentation

## 2017-11-25 DIAGNOSIS — J40 Bronchitis, not specified as acute or chronic: Secondary | ICD-10-CM

## 2017-11-25 HISTORY — DX: Essential (primary) hypertension: I10

## 2017-11-25 MED ORDER — IOHEXOL 300 MG/ML  SOLN
75.0000 mL | Freq: Once | INTRAMUSCULAR | Status: AC | PRN
  Administered 2017-11-25: 75 mL via INTRAVENOUS

## 2017-11-25 MED ORDER — AZITHROMYCIN 250 MG PO TABS: ORAL_TABLET | ORAL | 0 refills | Status: DC

## 2017-11-25 NOTE — Telephone Encounter (Signed)
Will you please review the chest xray that was done yesterday so I can call the pt back with the results. The pt is concerned because a CT scan was needed after the xray.

## 2017-11-25 NOTE — Telephone Encounter (Signed)
LMTCB. [facility] may give pt her xray results that Dr. Nicki Reaper has documented below.

## 2017-11-25 NOTE — Telephone Encounter (Signed)
She is getting her CT done today at [facility]. She is asking why she needs the CT, if something was seen on the xray. I told her that it was for additional clearance for her surgery, but she is really wanting to know the reason. Please call pt [phone]

## 2017-11-25 NOTE — Telephone Encounter (Signed)
cxr reviewed.  They mentioned a "subtle opacity" and recommended CT chest - just to get a better look at the lungs.  Just need a little better picture.  Let me know if any other questions or problems.

## 2017-11-26 DIAGNOSIS — M1610 Unilateral primary osteoarthritis, unspecified hip: Secondary | ICD-10-CM | POA: Insufficient documentation

## 2017-11-26 DIAGNOSIS — R918 Other nonspecific abnormal finding of lung field: Secondary | ICD-10-CM | POA: Insufficient documentation

## 2017-11-26 NOTE — Assessment & Plan Note (Signed)
Right hip replacement has been planned.

## 2017-11-26 NOTE — Assessment & Plan Note (Signed)
Bronchiectasis and scarring vs inflammation/infection was suggested.  Given her report of recent cough, will treat for atypical pneumonia with azithromycin

## 2017-11-26 NOTE — Assessment & Plan Note (Addendum)
A right upper lobe density was noted on today's screening chest x ray.  She was sent for chest CT with contrast ,  Given her history of breast cancer.   She will be treated for atypical pneumonia with 5 day course of azithromycin given her slight cough.  She had a clear llung exam and normal vital signs. Therefore I am recommending that she has no contraindications to surgery on her hip next week.

## 2017-11-26 NOTE — Assessment & Plan Note (Signed)
She uses a portable CPAP with travel but will be bringing her home CPAP unit to her surgery

## 2017-11-29 NOTE — Telephone Encounter (Signed)
Spoke with Dr. Derrel Nip and she stated that she spoke with the pt verbally in regards to xray results and CT scan that was ordered.

## 2017-12-18 ENCOUNTER — Other Ambulatory Visit: Payer: Self-pay | Admitting: Internal Medicine

## 2017-12-22 ENCOUNTER — Ambulatory Visit (INDEPENDENT_AMBULATORY_CARE_PROVIDER_SITE_OTHER): Payer: Medicare Other

## 2017-12-22 VITALS — BP 110/78 | HR 75 | Temp 98.5°F | Resp 14 | Ht 67.0 in | Wt 227.8 lb

## 2017-12-22 DIAGNOSIS — Z0000 Encounter for general adult medical examination without abnormal findings: Secondary | ICD-10-CM

## 2017-12-22 NOTE — Patient Instructions (Addendum)
  Ms. Heaps , Thank you for taking time to come for your Medicare Wellness Visit. I appreciate your ongoing commitment to your health goals. Please review the following plan we discussed and let me know if I can assist you in the future.   Follow up with your doctor tomorrow.    Bring a copy of your [place] and/or Living Will to be scanned into chart.  Have a great day!  These are the goals we discussed: Goals      Patient Stated   . Increase physical activity (pt-stated)     Increase stationary bicycle, gym, yoga and exercise as tolerated through hip surgery recovery.       This is a list of the screening recommended for you and due dates:  Health Maintenance  Topic Date Due  . Pneumonia vaccines (2 of 2 - PPSV23) 09/21/2014  . Flu Shot  10/20/2017  . Colon Cancer Screening  10/28/2022  . Tetanus Vaccine  12/28/2023  . DEXA scan (bone density measurement)  Completed  .  Hepatitis C: One time screening is recommended by Center for Disease Control  (CDC) for  adults born from [birth year] through 1963.   Completed  . Mammogram  Discontinued

## 2017-12-22 NOTE — Progress Notes (Addendum)
Subjective:   Heidi Walsh is a 72 y.o. female who presents for Medicare Annual (Subsequent) preventive examination.  Review of Systems:  No ROS.  Medicare Wellness Visit. Additional risk factors are reflected in the social history. Cardiac Risk Factors include: advanced age (>46men, >25 women);obesity (BMI >30kg/m2)     Objective:     Vitals: BP 110/78 (BP Location: Left Arm, Patient Position: Sitting, Cuff Size: Large)   Pulse 75   Temp 98.5 F (36.9 C) (Oral)   Resp 14   Ht 5\' 7"  (1.702 m)   Wt 227 lb 12.8 oz (103.3 kg)   SpO2 95%   BMI 35.68 kg/m   Body mass index is 35.68 kg/m.  Advanced Directives 12/22/2017 12/21/2016 08/20/2015  Does Patient Have a Medical Advance Directive? Yes Yes Yes  Type of Paramedic of [place];Living will [place];Living will [place];Living will  Does patient want to make changes to medical advance directive? No - Patient declined No - Patient declined No - Patient declined  Copy of [place] in Chart? No - copy requested No - copy requested -    Tobacco Social History   Tobacco Use  Smoking Status Never Smoker  Smokeless Tobacco Never Used     Counseling given: Not Answered   Clinical Intake:  Pre-visit preparation completed: Yes  Pain : 0-10 Pain Score: 2  Pain Type: Chronic pain Pain Location: Hip Pain Orientation: Right Pain Descriptors / Indicators: Discomfort Pain Onset: More than a month ago(Recent R hip surgery. ) Pain Frequency: Constant Pain Relieving Factors: Rest. Tylenol.   Pain Relieving Factors: Rest. Tylenol.   Nutritional Status: BMI > 30  Obese Diabetes: No  How often do you need to have someone help you when you read instructions, pamphlets, or other written materials from your doctor or pharmacy?: 1 - Never  Interpreter Needed?: No     Past Medical History:  Diagnosis Date  . Actinic keratosis    managed by  Darlis Loan  . Atrial fibrillation [facility]) 2010   Followed by Dr. Jordan Hawks  . breast cancer 2010   s/p mastectomy  . Breast cyst 2003   aspirated, fibroystics breasts with stable calcifications on left breast  . Hypertension   . Leukopenia   . Mitral regurgitation    and prolapse  . Osteopenia 56   Stable by repeat scans   Past Surgical History:  Procedure Laterality Date  . ATRIAL ABLATION SURGERY  Dec 2012   [facility]  . CESAREAN SECTION     x 2  . JOINT REPLACEMENT    . KNEE ARTHROSCOPY W/ MENISCECTOMY  1997  . MASTECTOMY  02/2009   Bilateral  . MASTECTOMY     Right and Left  . SHOULDER SURGERY     left rotator cuff  . TOTAL KNEE ARTHROPLASTY  02/2014   Left knee   Family History  Problem Relation Age of Onset  . COPD Mother   . Hypertension Father   . Hepatitis Sister        Hepatitis C from a dental procedure   Social History   Socioeconomic History  . Marital status: Married    Spouse name: Not on file  . Number of children: Not on file  . Years of education: Not on file  . Highest education level: Not on file  Occupational History  . Not on file  Social Needs  . Financial resource strain: Not hard  at all  . Food insecurity:    Worry: Never true    Inability: Never true  . Transportation needs:    Medical: No    Non-medical: No  Tobacco Use  . Smoking status: Never Smoker  . Smokeless tobacco: Never Used  Substance and Sexual Activity  . Alcohol use: Yes    Alcohol/week: 0.0 standard drinks    Comment: cup of wine three times a week   . Drug use: No  . Sexual activity: Not on file  Lifestyle  . Physical activity:    Days per week: 2 days    Minutes per session: 10 min  . Stress: Not at all  Relationships  . Social connections:    Talks on phone: Not on file    Gets together: Not on file    Attends religious service: Not on file    Active member of club or organization: Not on file    Attends meetings of clubs or organizations: Not on file     Relationship status: Not on file  Other Topics Concern  . Not on file  Social History Narrative  . Not on file    Outpatient Encounter Medications as of 12/22/2017  Medication Sig  . ALPRAZolam (XANAX) 0.25 MG tablet Take 1 tablet (0.25 mg total) by mouth 2 (two) times daily as needed for anxiety.  Marland Kitchen aspirin 81 MG tablet Take 81 mg by mouth 2 (two) times daily.  Marland Kitchen buPROPion (WELLBUTRIN SR) 200 MG 12 hr tablet TAKE 1 TABLET BY MOUTH TWICE A DAY  . Calcium Carbonate-Vitamin D (CALCIUM-VITAMIN D) 500-200 MG-UNIT per tablet Take 1 tablet by mouth 2 (two) times daily with a meal.    . cetirizine (ZYRTEC) 10 MG tablet Take 10 mg by mouth daily.    Marland Kitchen docusate sodium (COLACE) 100 MG capsule Take 100 mg by mouth 2 (two) times daily.  . hydrochlorothiazide 25 MG tablet Take 25 mg by mouth daily.    Marland Kitchen KLOR-CON M20 20 MEQ tablet TAKE 1 TABLET BY MOUTH EVERY DAY  . metoprolol succinate (TOPROL-XL) 25 MG 24 hr tablet Take 25 mg by mouth daily.  . Multiple Vitamin (MULTIVITAMIN) tablet Take 1 tablet by mouth daily.    . [DISCONTINUED] azithromycin (ZITHROMAX) 250 MG tablet Tale 500 mg PO on day 1, then 250 mg PO q24h x 4 days.  . [DISCONTINUED] cycloSPORINE (RESTASIS) 0.05 % ophthalmic emulsion Place 1 drop into both eyes 2 (two) times daily.  . [DISCONTINUED] HYDROcodone-homatropine (HYCODAN) 5-1.5 MG/5ML syrup Take 5 mLs by mouth at bedtime as needed for cough.  . [DISCONTINUED] oseltamivir (TAMIFLU) 75 MG capsule Take 1 capsule (75 mg total) by mouth 2 (two) times daily.   No facility-administered encounter medications on file as of 12/22/2017.     Activities of Daily Living In your present state of health, do you have any difficulty performing the following activities: 12/22/2017  Hearing? N  Vision? N  Difficulty concentrating or making decisions? N  Walking or climbing stairs? N  Dressing or bathing? N  Doing errands, shopping? N  Preparing Food and eating ? N  Using the Toilet? N  In the  past six months, have you accidently leaked urine? N  Do you have problems with loss of bowel control? N  Managing your Medications? N  Managing your Finances? N  Housekeeping or managing your Housekeeping? N  Some recent data might be hidden    Patient Care Team: Crecencio Mc, MD as PCP -  General (Internal Medicine)    Assessment:   This is a routine wellness examination for [place].  The goal of the wellness visit is to assist the patient how to close the gaps in care and create a preventative care plan for the patient.   She feels relatively well recovering with her recent R hip surgery. Expresses concerns noticing more night sweats, chills, cough and difficulty swallowing things like peanut butter.  Second chest xray requested in relation to atypical  diagnosis of pneumonia. Deferred to pcp and scheduled with pcp for follow up.    Taking calcium VIT D as appropriate/Osteoporosis risk reviewed.    Safety issues reviewed; Smoke and carbon monoxide detectors in the home. No firearms in the home. Wears seatbelts when driving or riding with others. No violence in the home.  They do not have excessive sun exposure.  Discussed the need for sun protection: hats, long sleeves and the use of sunscreen if there is significant sun exposure.  No new identified risk were noted.  No failures at ADL's or IADL's.  BMI- discussed the importance of a healthy diet, water intake and the benefits of aerobic exercise. Educational material provided.   Dental- every 6 months.  Sleep patterns- Sleeps 7 hours at night.  CPAP in use.  Influenza and Pneumococcal vaccine discussed.    Exercise Activities and Dietary recommendations Current Exercise Habits: Home exercise routine, Type of exercise: stretching;yoga;calisthenics, Time (Minutes): 10, Frequency (Times/Week): 2, Weekly Exercise (Minutes/Week): 20, Intensity: Mild, Exercise limited by: orthopedic condition(s)  Goals      Patient Stated   .  Increase physical activity (pt-stated)     Increase stationary bicycle, gym, yoga and exercise as tolerated through hip surgery recovery.       Fall Risk Fall Risk  12/22/2017 12/21/2016 01/05/2016 10/28/2014 12/25/2012  Falls in the past year? Yes No No No No  Number falls in past yr: 1 - - - -  Injury with Fall? No - - - -  Comment Uneven ground when walking outdoors. No injury.  - - - -   Depression Screen PHQ 2/9 Scores 12/22/2017 12/21/2016 01/05/2016 08/03/2015  PHQ - 2 Score 0 1 0 2  PHQ- 9 Score - 2 - 5     Cognitive Function MMSE - Mini Mental State Exam 12/22/2017 12/21/2016  Orientation to time 5 5  Orientation to Place 5 5  Registration 3 3  Attention/ Calculation 5 5  Recall 3 3  Language- name 2 objects 2 2  Language- repeat 1 1  Language- follow 3 step command 3 3  Language- read & follow direction 1 1  Write a sentence 1 1  Copy design 1 1  Total score 30 30        Immunization History  Administered Date(s) Administered  . Influenza Split 02/23/2011, 02/20/2012  . Influenza Whole 02/04/2016  . Influenza, High Dose Seasonal PF 01/01/2015  . Influenza,inj,Quad PF,6+ Mos 12/25/2012  . Influenza-Unspecified 12/16/2016  . Pneumococcal Conjugate-13 09/20/2013  . Tdap 12/27/2013  . Zoster 12/26/2007   Screening Tests Health Maintenance  Topic Date Due  . PNA vac Low Risk Adult (2 of 2 - PPSV23) 09/21/2014  . INFLUENZA VACCINE  10/20/2017  . COLONOSCOPY  10/28/2022  . TETANUS/TDAP  12/28/2023  . DEXA SCAN  Completed  . Hepatitis C Screening  Completed  . MAMMOGRAM  Discontinued      Plan:   End of life planning; Advance aging; Advanced directives discussed. Copy of current HCPOA/Living  Will requested.    I have personally reviewed and noted the following in the patient's chart:   . Medical and social history . Use of alcohol, tobacco or illicit drugs  . Current medications and supplements . Functional ability and status . Nutritional  status . Physical activity . Advanced directives . List of other physicians . Hospitalizations, surgeries, and ER visits in previous 12 months . Vitals . Screenings to include cognitive, depression, and falls . Referrals and appointments  In addition, I have reviewed and discussed with patient certain preventive protocols, quality metrics, and best practice recommendations. A written personalized care plan for preventive services as well as general preventive health recommendations were provided to patient.     OBrien-Blaney, Brunilda Eble L, LPN  [id number]    I have reviewed the above information and agree with above.   Deborra Medina, MD

## 2017-12-23 ENCOUNTER — Encounter: Payer: Self-pay | Admitting: Internal Medicine

## 2017-12-23 ENCOUNTER — Telehealth: Payer: Self-pay | Admitting: Radiology

## 2017-12-23 ENCOUNTER — Ambulatory Visit (INDEPENDENT_AMBULATORY_CARE_PROVIDER_SITE_OTHER): Payer: Medicare Other

## 2017-12-23 ENCOUNTER — Ambulatory Visit: Payer: Medicare Other | Admitting: Internal Medicine

## 2017-12-23 VITALS — BP 102/66 | HR 75 | Temp 98.6°F | Resp 16 | Ht 67.0 in | Wt 222.8 lb

## 2017-12-23 DIAGNOSIS — R112 Nausea with vomiting, unspecified: Secondary | ICD-10-CM | POA: Diagnosis not present

## 2017-12-23 DIAGNOSIS — R918 Other nonspecific abnormal finding of lung field: Secondary | ICD-10-CM | POA: Diagnosis not present

## 2017-12-23 DIAGNOSIS — R509 Fever, unspecified: Secondary | ICD-10-CM | POA: Diagnosis not present

## 2017-12-23 DIAGNOSIS — R1310 Dysphagia, unspecified: Secondary | ICD-10-CM | POA: Diagnosis not present

## 2017-12-23 DIAGNOSIS — R1319 Other dysphagia: Secondary | ICD-10-CM

## 2017-12-23 LAB — COMPREHENSIVE METABOLIC PANEL
ALT: 16 U/L (ref 0–35)
AST: 17 U/L (ref 0–37)
Albumin: 4 g/dL (ref 3.5–5.2)
Alkaline Phosphatase: 87 U/L (ref 39–117)
BUN: 21 mg/dL (ref 6–23)
CHLORIDE: 100 meq/L (ref 96–112)
CO2: 29 meq/L (ref 19–32)
Calcium: 9.3 mg/dL (ref 8.4–10.5)
Creatinine, Ser: 0.93 mg/dL (ref 0.40–1.20)
GFR: 63.31 mL/min (ref >60.00)
GLUCOSE: 99 mg/dL (ref 70–99)
POTASSIUM: 3.6 meq/L (ref 3.5–5.1)
SODIUM: 138 meq/L (ref 135–145)
Total Bilirubin: 0.5 mg/dL (ref 0.2–1.2)
Total Protein: 7.5 g/dL (ref 6.0–8.3)

## 2017-12-23 LAB — C-REACTIVE PROTEIN: CRP: 1.2 mg/dL (ref 0.5–20.0)

## 2017-12-23 MED ORDER — AZITHROMYCIN 500 MG PO TABS: 500.0000 mg | ORAL_TABLET | Freq: Every day | ORAL | 0 refills | Status: DC

## 2017-12-23 NOTE — Telephone Encounter (Signed)
Pt was a hard stick. Unable to get enough blood for Sed Rate or CBC.

## 2017-12-23 NOTE — Patient Instructions (Signed)
If the chest x ray suggest a persistent infiltrate in the right lower lobe,  I will prescribe another round of antibioitics  And send you to a pulmonologist for further evaluation of the infiltrate and bronchiectasis   I am also recommending a swallow evaluation to see if you are silently aspirating

## 2017-12-23 NOTE — Progress Notes (Signed)
Subjective:  Patient ID: Heidi Walsh, female    DOB: [birth year]  Age: 72 y.o. MRN: [id number]  CC: The primary encounter diagnosis was Right lower lobe pulmonary infiltrate. Diagnoses of Fever, unspecified fever cause, Non-intractable vomiting with nausea, unspecified vomiting type, Dysphagia, unspecified type, and Dysphagia causing pulmonary aspiration with swallowing were also pertinent to this visit.  HPI Heidi Walsh presents for follow up on recent diagnosis of atypical pneumonia    Treated for atypical pneumonia 1 week prior to her hip replacement in early September.  Found during preop evaluation when she presented with nonproductive cough.    CT chest was done and RLL patchy density with bronchiectasis  was noted.  Symptoms  Improved with prescribed  azithromycin  and she also received clindamycin perioperatively ,  But  Cough returned after antibiotics were stopped  And she is now reporting a heavy feeling in chest, accompanied by frequent sweats and chills,  frequent need to clear her throat .  And a persistent  feeling of a lump in her throat when she swallows.  She also notes that she is unable to swallow large tablets and spoonfuls of Peanut butter   Last night vomited after a prodrome of not feeling well.  Occurred at 2 am .  Still doesn't feel well.     Outpatient Medications Prior to Visit  Medication Sig Dispense Refill  . ALPRAZolam (XANAX) 0.25 MG tablet Take 1 tablet (0.25 mg total) by mouth 2 (two) times daily as needed for anxiety. 60 tablet 4  . aspirin 81 MG tablet Take 81 mg by mouth 2 (two) times daily.    Marland Kitchen buPROPion (WELLBUTRIN SR) 200 MG 12 hr tablet TAKE 1 TABLET BY MOUTH TWICE A DAY 180 tablet 0  . Calcium Carbonate-Vitamin D (CALCIUM-VITAMIN D) 500-200 MG-UNIT per tablet Take 1 tablet by mouth 2 (two) times daily with a meal.      . cetirizine (ZYRTEC) 10 MG tablet Take 10 mg by mouth daily.      Marland Kitchen docusate sodium (COLACE) 100 MG capsule Take 100 mg  by mouth 2 (two) times daily.    . hydrochlorothiazide 25 MG tablet Take 25 mg by mouth daily.      Marland Kitchen KLOR-CON M20 20 MEQ tablet TAKE 1 TABLET BY MOUTH EVERY DAY 30 tablet 2  . metoprolol succinate (TOPROL-XL) 25 MG 24 hr tablet Take 25 mg by mouth daily.    . Multiple Vitamin (MULTIVITAMIN) tablet Take 1 tablet by mouth daily.       No facility-administered medications prior to visit.     Review of Systems;  Patient denies headache, fevers,unintentional weight loss, skin rash, eye pain, sinus congestion and sinus pain, sore throat, dysphagia,  hemoptysis , , dyspnea, wheezing, chest pain, palpitations, orthopnea, edema, abdominal pain, , melena, diarrhea, constipation, flank pain, dysuria, hematuria, urinary  Frequency, nocturia, numbness, tingling, seizures,  Focal weakness, Loss of consciousness,  Tremor, insomnia, depression, anxiety, and suicidal ideation.      Objective:  BP 102/66 (BP Location: Right Arm, Patient Position: Sitting, Cuff Size: Large)   Pulse 75   Temp 98.6 F (37 C) (Oral)   Resp 16   Ht 5\' 7"  (1.702 m)   Wt 222 lb 12.8 oz (101.1 kg)   SpO2 98%   BMI 34.90 kg/m   BP Readings from Last 3 Encounters:  12/23/17 102/66  12/22/17 110/78  11/24/17 126/82    Wt Readings from Last 3 Encounters:  12/23/17 222 lb  12.8 oz (101.1 kg)  12/22/17 227 lb 12.8 oz (103.3 kg)  11/24/17 229 lb 4 oz (104 kg)    General appearance: anxious, alert, cooperative and appears stated age Ears: normal TM's and external ear canals both ears Throat: lips, mucosa, and tongue normal; teeth and gums normal Neck: no adenopathy, no carotid bruit, supple, symmetrical, trachea midline and thyroid not enlarged, symmetric, no tenderness/mass/nodules Back: symmetric, no curvature. ROM normal. No CVA tenderness. Lungs: clear to auscultation bilaterally Heart: regular rate and rhythm, S1, S2 normal, no murmur, click, rub or gallop Abdomen: soft, non-tender; bowel sounds normal; no masses,   no organomegaly Pulses: 2+ and symmetric Skin: Skin color, texture, turgor normal. No rashes or lesions Lymph nodes: Cervical, supraclavicular, and axillary nodes normal.  Lab Results  Component Value Date   HGBA1C 5.8 11/24/2017   HGBA1C 5.7 12/30/2016   HGBA1C 5.9 01/11/2013    Lab Results  Component Value Date   CREATININE 0.93 12/23/2017   CREATININE 0.69 11/24/2017   CREATININE 0.74 08/20/2015    Lab Results  Component Value Date   WBC 4.2 11/24/2017   HGB 14.4 11/24/2017   HCT 41.7 11/24/2017   PLT 224.0 11/24/2017   GLUCOSE 99 12/23/2017   CHOL 197 11/06/2014   TRIG 138.0 11/06/2014   HDL 52.50 11/06/2014   LDLDIRECT 146.4 01/11/2013   LDLCALC 117 (H) 11/06/2014   ALT 16 12/23/2017   AST 17 12/23/2017   NA 138 12/23/2017   K 3.6 12/23/2017   CL 100 12/23/2017   CREATININE 0.93 12/23/2017   BUN 21 12/23/2017   CO2 29 12/23/2017   TSH 2.30 11/24/2017   INR 1.0 11/24/2017   HGBA1C 5.8 11/24/2017    Ct Chest W Contrast  Result Date: 11/25/2017 CLINICAL DATA:  Subtle focal opacity in the upper right lung on a frontal chest radiograph obtained yesterday. Currently asymptomatic. History of breast and bladder cancer. EXAM: CT CHEST WITH CONTRAST TECHNIQUE: Multidetector CT imaging of the chest was performed during intravenous contrast administration. CONTRAST:  21mL OMNIPAQUE IOHEXOL 300 MG/ML  SOLN COMPARISON:  Chest radiographs obtained yesterday. FINDINGS: Cardiovascular: No significant vascular findings. Normal heart size. No pericardial effusion. Mediastinum/Nodes: No enlarged mediastinal, hilar, or axillary lymph nodes. Thyroid gland, trachea, and esophagus demonstrate no significant findings. Lungs/Pleura: Area of cylindrical bronchiectasis and patchy density in the medial aspect of the right lower lobe. No right upper lung zone nodule or consolidation. Small amount of patchy density in the left lower lobe laterally and inferiorly. No pleural fluid. Upper Abdomen:  Diffuse low density of the liver relative to the spleen. Musculoskeletal: Thoracic spine degenerative changes. Bilateral postmastectomy changes. IMPRESSION: 1. Area of cylindrical bronchiectasis and patchy density in the medial aspect of the right lower lobe. The patchy density could represent scarring, inflammation or infection. 2. Small amount of patchy density in the left lower lobe laterally and inferiorly. This could also represent scarring, inflammation or infection. 3. Diffuse hepatic steatosis. Electronically Signed   By: Claudie Revering M.D.   On: 11/25/2017 15:31    Assessment & Plan:   Problem List Items Addressed This Visit    Dysphagia causing pulmonary aspiration with swallowing    Suspected ,  Given location of infiltrate in RLL.  Needs swallow evaluation to rule out aspiration events        Right lower lobe pulmonary infiltrate - Primary    Repeat chest x ray was not helpful  Previous infiltrates were seen on CT chest .  Will repeat  treatment with azithromycin and if symptoms doe not resolve,  Sh will need a repeat CT and a pulmonary consult       Relevant Orders   DG Chest 2 View (Completed)   DG Esophagus    Other Visit Diagnoses    Fever, unspecified fever cause       Relevant Orders   C-reactive protein (Completed)   Sedimentation rate   CBC with Differential/Platelet   Non-intractable vomiting with nausea, unspecified vomiting type       Relevant Orders   Comprehensive metabolic panel (Completed)   Dysphagia, unspecified type       Relevant Orders   DG Esophagus     A total of 25 minutes of face to face time was spent with patient more than half of which was spent in counselling about the above mentioned conditions  and coordination of care   I am having Alba Destine start on azithromycin. I am also having her maintain her hydrochlorothiazide, cetirizine, multivitamin, calcium-vitamin D, aspirin, metoprolol succinate, docusate sodium, buPROPion, ALPRAZolam, and  KLOR-CON M20.  Meds ordered this encounter  Medications  . azithromycin (ZITHROMAX) 500 MG tablet    Sig: Take 1 tablet (500 mg total) by mouth daily.    Dispense:  7 tablet    Refill:  0    There are no discontinued medications.  Follow-up: No follow-ups on file.   Crecencio Mc, MD

## 2017-12-25 DIAGNOSIS — R1319 Other dysphagia: Secondary | ICD-10-CM | POA: Insufficient documentation

## 2017-12-25 NOTE — Assessment & Plan Note (Addendum)
Repeat chest x ray was not helpful  Previous infiltrates were seen on CT chest .  Will repeat treatment with azithromycin and if symptoms doe not resolve,  Sh will need a repeat CT and a pulmonary consult

## 2017-12-25 NOTE — Assessment & Plan Note (Signed)
Suspected ,  Given location of infiltrate in RLL.  Needs swallow evaluation to rule out aspiration events

## 2018-01-03 ENCOUNTER — Ambulatory Visit
Admission: RE | Admit: 2018-01-03 | Discharge: 2018-01-03 | Disposition: A | Discharge status: Home / Self Care | Payer: Medicare Other | Source: Ambulatory Visit | Attending: Internal Medicine | Admitting: Internal Medicine

## 2018-01-03 DIAGNOSIS — K228 Other specified diseases of esophagus: Secondary | ICD-10-CM | POA: Diagnosis not present

## 2018-01-03 DIAGNOSIS — R918 Other nonspecific abnormal finding of lung field: Secondary | ICD-10-CM | POA: Diagnosis present

## 2018-01-03 DIAGNOSIS — K449 Diaphragmatic hernia without obstruction or gangrene: Secondary | ICD-10-CM | POA: Diagnosis not present

## 2018-01-03 DIAGNOSIS — R1310 Dysphagia, unspecified: Secondary | ICD-10-CM | POA: Diagnosis present

## 2018-01-04 ENCOUNTER — Other Ambulatory Visit: Payer: Self-pay | Admitting: Internal Medicine

## 2018-01-10 ENCOUNTER — Other Ambulatory Visit: Payer: Self-pay | Admitting: Internal Medicine

## 2018-01-19 ENCOUNTER — Other Ambulatory Visit: Payer: Self-pay | Admitting: Internal Medicine

## 2018-01-19 DIAGNOSIS — E876 Hypokalemia: Secondary | ICD-10-CM

## 2018-01-19 DIAGNOSIS — R509 Fever, unspecified: Secondary | ICD-10-CM

## 2018-01-19 DIAGNOSIS — R1319 Other dysphagia: Secondary | ICD-10-CM

## 2018-01-19 DIAGNOSIS — R05 Cough: Secondary | ICD-10-CM

## 2018-01-24 ENCOUNTER — Other Ambulatory Visit (INDEPENDENT_AMBULATORY_CARE_PROVIDER_SITE_OTHER): Payer: Medicare Other

## 2018-01-24 ENCOUNTER — Ambulatory Visit (INDEPENDENT_AMBULATORY_CARE_PROVIDER_SITE_OTHER): Payer: Medicare Other

## 2018-01-24 DIAGNOSIS — R05 Cough: Secondary | ICD-10-CM | POA: Diagnosis not present

## 2018-01-24 DIAGNOSIS — R509 Fever, unspecified: Secondary | ICD-10-CM | POA: Diagnosis not present

## 2018-01-24 DIAGNOSIS — E876 Hypokalemia: Secondary | ICD-10-CM

## 2018-01-24 LAB — CBC WITH DIFFERENTIAL/PLATELET
BASOS ABS: 0.1 10*3/uL (ref 0.0–0.1)
BASOS PCT: 1.2 % (ref 0.0–3.0)
EOS ABS: 0.2 10*3/uL (ref 0.0–0.7)
Eosinophils Relative: 5.1 % — ABNORMAL HIGH (ref 0.0–5.0)
HCT: 38.7 % (ref 36.0–46.0)
HEMOGLOBIN: 13.4 g/dL (ref 12.0–15.0)
Lymphocytes Relative: 38.1 % (ref 12.0–46.0)
Lymphs Abs: 1.6 10*3/uL (ref 0.7–4.0)
MCHC: 34.7 g/dL (ref 30.0–36.0)
MCV: 96.7 fl (ref 78.0–100.0)
MONO ABS: 0.4 10*3/uL (ref 0.1–1.0)
Monocytes Relative: 8.6 % (ref 3.0–12.0)
Neutro Abs: 2 10*3/uL (ref 1.4–7.7)
Neutrophils Relative %: 47 % (ref 43.0–77.0)
Platelets: 233 10*3/uL (ref 150.0–400.0)
RBC: 4 Mil/uL (ref 3.87–5.11)
RDW: 13.7 % (ref 11.5–15.5)
WBC: 4.2 10*3/uL (ref 4.0–10.5)

## 2018-01-24 LAB — BASIC METABOLIC PANEL
BUN: 17 mg/dL (ref 6–23)
CO2: 27 mEq/L (ref 19–32)
CREATININE: 0.93 mg/dL (ref 0.40–1.20)
Calcium: 9.6 mg/dL (ref 8.4–10.5)
Chloride: 96 mEq/L (ref 96–112)
GFR: 63.29 mL/min (ref >60.00)
Glucose, Bld: 152 mg/dL — ABNORMAL HIGH (ref 70–99)
Potassium: 3.3 mEq/L — ABNORMAL LOW (ref 3.5–5.1)
Sodium: 134 mEq/L — ABNORMAL LOW (ref 135–145)

## 2018-01-24 LAB — SEDIMENTATION RATE: SED RATE: 10 mm/h (ref 0–30)

## 2018-01-25 ENCOUNTER — Other Ambulatory Visit: Payer: Self-pay | Admitting: Internal Medicine

## 2018-01-25 DIAGNOSIS — R918 Other nonspecific abnormal finding of lung field: Secondary | ICD-10-CM

## 2018-01-25 NOTE — Progress Notes (Signed)
Ct chst

## 2018-02-02 NOTE — Telephone Encounter (Signed)
Yes, I sent her a mychart response yesterday regarding this. I gave her the number to call that is listed on her referral from [facility] since it has not been scheduled yet. Thanks! Melissa

## 2018-02-13 ENCOUNTER — Ambulatory Visit: Payer: Medicare Other

## 2018-02-15 ENCOUNTER — Ambulatory Visit: Payer: Medicare Other

## 2018-02-21 DIAGNOSIS — Z9889 Other specified postprocedural states: Secondary | ICD-10-CM | POA: Insufficient documentation

## 2018-02-21 DIAGNOSIS — M179 Osteoarthritis of knee, unspecified: Secondary | ICD-10-CM | POA: Insufficient documentation

## 2018-02-21 DIAGNOSIS — M6281 Muscle weakness (generalized): Secondary | ICD-10-CM | POA: Insufficient documentation

## 2018-02-21 DIAGNOSIS — M719 Bursopathy, unspecified: Secondary | ICD-10-CM | POA: Insufficient documentation

## 2018-02-21 DIAGNOSIS — M1611 Unilateral primary osteoarthritis, right hip: Secondary | ICD-10-CM | POA: Insufficient documentation

## 2018-02-28 ENCOUNTER — Ambulatory Visit
Admission: RE | Admit: 2018-02-28 | Discharge: 2018-02-28 | Disposition: A | Discharge status: Home / Self Care | Payer: Medicare Other | Source: Ambulatory Visit | Attending: Internal Medicine | Admitting: Internal Medicine

## 2018-02-28 DIAGNOSIS — R918 Other nonspecific abnormal finding of lung field: Secondary | ICD-10-CM | POA: Insufficient documentation

## 2018-03-01 ENCOUNTER — Other Ambulatory Visit: Payer: Self-pay | Admitting: Internal Medicine

## 2018-03-01 DIAGNOSIS — R918 Other nonspecific abnormal finding of lung field: Secondary | ICD-10-CM | POA: Insufficient documentation

## 2018-03-01 DIAGNOSIS — R911 Solitary pulmonary nodule: Secondary | ICD-10-CM

## 2018-03-01 DIAGNOSIS — J189 Pneumonia, unspecified organism: Secondary | ICD-10-CM | POA: Insufficient documentation

## 2018-03-01 DIAGNOSIS — J690 Pneumonitis due to inhalation of food and vomit: Secondary | ICD-10-CM

## 2018-03-01 DIAGNOSIS — IMO0002 Reserved for concepts with insufficient information to code with codable children: Secondary | ICD-10-CM

## 2018-03-01 DIAGNOSIS — J701 Chronic and other pulmonary manifestations due to radiation: Secondary | ICD-10-CM

## 2018-03-01 MED ORDER — LEVOFLOXACIN 500 MG PO TABS: 500.0000 mg | ORAL_TABLET | Freq: Every day | ORAL | 0 refills | Status: DC

## 2018-03-01 MED ORDER — CLINDAMYCIN HCL 300 MG PO CAPS: 300.0000 mg | ORAL_CAPSULE | Freq: Three times a day (TID) | ORAL | 0 refills | Status: DC

## 2018-03-01 NOTE — Assessment & Plan Note (Signed)
Suspected due to aspiration. Levaquin/clindamycin prescribed.  GI evaluation in process

## 2018-03-29 ENCOUNTER — Other Ambulatory Visit: Payer: Self-pay | Admitting: Internal Medicine

## 2018-05-09 DIAGNOSIS — K224 Dyskinesia of esophagus: Secondary | ICD-10-CM | POA: Insufficient documentation

## 2018-05-10 ENCOUNTER — Telehealth: Payer: Self-pay | Admitting: *Deleted

## 2018-05-10 NOTE — Telephone Encounter (Signed)
Patient scheduled.

## 2018-05-10 NOTE — Telephone Encounter (Signed)
Copied from [place] [id number]. Topic: Referral - Status >> May 10, 2018 11:04 AM Scherrie Gerlach wrote: Reason for CRM: pt would like to see Dr Derrel Nip next week to discuss her visit to the pulmonologist and getting a 2nd opinion from another dr..  Pt declined to see anyone else. Pt aware Dr Derrel Nip is out this week and may not get message until Monday.

## 2018-05-19 ENCOUNTER — Ambulatory Visit (INDEPENDENT_AMBULATORY_CARE_PROVIDER_SITE_OTHER): Payer: Medicare Other

## 2018-05-19 ENCOUNTER — Encounter: Payer: Self-pay | Admitting: Internal Medicine

## 2018-05-19 ENCOUNTER — Ambulatory Visit: Payer: Medicare Other | Admitting: Internal Medicine

## 2018-05-19 VITALS — BP 112/70 | HR 92 | Temp 98.1°F | Resp 15 | Ht 67.0 in | Wt 222.6 lb

## 2018-05-19 DIAGNOSIS — J471 Bronchiectasis with (acute) exacerbation: Secondary | ICD-10-CM

## 2018-05-19 DIAGNOSIS — J479 Bronchiectasis, uncomplicated: Secondary | ICD-10-CM

## 2018-05-19 MED ORDER — AZITHROMYCIN 500 MG PO TABS: 500.0000 mg | ORAL_TABLET | Freq: Every day | ORAL | 0 refills | Status: DC

## 2018-05-19 NOTE — Patient Instructions (Signed)
I want you to pick up the azithromycin but do not start it until you hear from me about the results of your x ray and and labs   I am ordering the the CT of the chest along with a referral either Dr Philippa Chester or Dr Derrill Kay

## 2018-05-19 NOTE — Progress Notes (Signed)
Subjective:  Patient ID: Heidi Walsh, female    DOB: [birth year]  Age: 73 y.o. MRN: [id number]  CC: There were no encounter diagnoses.  HPI Heidi Walsh presents for follow up on multifocal pneumonia and recurrent cough.  She has undergone a recent pulmonary evaluation at [facility] for persistent cough in the setting of chest CT concerning for  bilateral patchy opacities  and bronchiectasis. She underwent  a barium swallow (to rule out aspiration events)  and an upper endoscopy at [facility] .  PFTs were reportedly normal and endoscopy showed no signs of esophagitis   She underwent formal PFTS which were reportedly normal and available through the Epic portal on the [facility] website . She was advised that her symptoms would improve after starting regular inhalation of nebulized hypertonic saline and albuterol .  She has been using the nebs as directed but has had no significant change in her nonproductive cough. .She recalls that her cough improved only after treatment with antibiotics but the improvement was transient , lasting a few weeks .  When the cough returns it is nonpoductive,  unaccompanied by wheezing,  Shortness of breath and fevers.  She does note a  a drop in energy when she has  an increase in cough   Last use of antibiotics was in December (levaquin).  She was treated with a course of Azithromycin in October.  She is requesting a second opinion .  Taking SAM E  For joint pain ; has stopped NSAIDs at the advice of GI given gastric erosions noted on EGD   Outpatient Medications Prior to Visit  Medication Sig Dispense Refill  . albuterol (PROVENTIL) (2.5 MG/3ML) 0.083% nebulizer solution INHALE 3 ML (2.5 MG TOTAL) BY NEBULIZATION EVERY FOUR (4) HOURS AS NEEDED FOR WHEEZING.    . ALPRAZolam (XANAX) 0.25 MG tablet Take 1 tablet (0.25 mg total) by mouth 2 (two) times daily as needed for anxiety. 60 tablet 4  . aspirin 81 MG tablet Take 81 mg by mouth 2 (two) times daily.    Marland Kitchen buPROPion  (WELLBUTRIN SR) 200 MG 12 hr tablet TAKE 1 TABLET BY MOUTH TWICE A DAY (Patient taking differently: Take 200 mg by mouth daily. ) 180 tablet 0  . Calcium Carbonate-Vitamin D (CALCIUM-VITAMIN D) 500-200 MG-UNIT per tablet Take 1 tablet by mouth 2 (two) times daily with a meal.      . cetirizine (ZYRTEC) 10 MG tablet Take 10 mg by mouth daily.      Marland Kitchen docusate sodium (COLACE) 100 MG capsule Take 100 mg by mouth 2 (two) times daily.    . hydrochlorothiazide (HYDRODIURIL) 50 MG tablet Take 50 mg by mouth daily.    . metoprolol succinate (TOPROL-XL) 25 MG 24 hr tablet Take 25 mg by mouth daily.    . Multiple Vitamin (MULTIVITAMIN) tablet Take 1 tablet by mouth daily.      . sodium chloride HYPERTONIC 3 % nebulizer solution INHALE 4 ML BY NEBULIZATION TWO (2) TIMES A DAY.    Marland Kitchen azithromycin (ZITHROMAX) 500 MG tablet Take 1 tablet (500 mg total) by mouth daily. (Patient not taking: Reported on 05/19/2018) 7 tablet 0  . clindamycin (CLEOCIN) 300 MG capsule Take 1 capsule (300 mg total) by mouth 3 (three) times daily. (Patient not taking: Reported on 05/19/2018) 21 capsule 0  . KLOR-CON M20 20 MEQ tablet TAKE 1 TABLET BY MOUTH EVERY DAY (Patient not taking: Reported on 05/19/2018) 30 tablet 2  . levofloxacin (LEVAQUIN) 500 MG tablet  Take 1 tablet (500 mg total) by mouth daily. (Patient not taking: Reported on 05/19/2018) 7 tablet 0   No facility-administered medications prior to visit.     Review of Systems;  Patient denies headache, fevers, malaise, unintentional weight loss, skin rash, eye pain, sinus congestion and sinus pain, sore throat, dysphagia,  hemoptysis , cough, dyspnea, wheezing, chest pain, palpitations, orthopnea, edema, abdominal pain, nausea, melena, diarrhea, constipation, flank pain, dysuria, hematuria, urinary  Frequency, nocturia, numbness, tingling, seizures,  Focal weakness, Loss of consciousness,  Tremor, insomnia, depression, anxiety, and suicidal ideation.      Objective:  BP 112/70  (BP Location: Left Arm, Patient Position: Sitting, Cuff Size: Large)   Pulse 92   Temp 98.1 F (36.7 C) (Oral)   Resp 15   Ht 5\' 7"  (1.702 m)   Wt 222 lb 9.6 oz (101 kg)   SpO2 95%   BMI 34.86 kg/m   BP Readings from Last 3 Encounters:  05/19/18 112/70  12/23/17 102/66  12/22/17 110/78    Wt Readings from Last 3 Encounters:  05/19/18 222 lb 9.6 oz (101 kg)  12/23/17 222 lb 12.8 oz (101.1 kg)  12/22/17 227 lb 12.8 oz (103.3 kg)    General appearance: alert, cooperative and appears stated age Ears: normal TM's and external ear canals both ears Throat: lips, mucosa, and tongue normal; teeth and gums normal Neck: no adenopathy, no carotid bruit, supple, symmetrical, trachea midline and thyroid not enlarged, symmetric, no tenderness/mass/nodules Back: symmetric, no curvature. ROM normal. No CVA tenderness. Lungs: clear to auscultation bilaterally Heart: regular rate and rhythm, S1, S2 normal, no murmur, click, rub or gallop Abdomen: soft, non-tender; bowel sounds normal; no masses,  no organomegaly Pulses: 2+ and symmetric Skin: Skin color, texture, turgor normal. No rashes or lesions Lymph nodes: Cervical, supraclavicular, and axillary nodes normal.  Lab Results  Component Value Date   HGBA1C 5.8 11/24/2017   HGBA1C 5.7 12/30/2016   HGBA1C 5.9 01/11/2013    Lab Results  Component Value Date   CREATININE 0.93 01/24/2018   CREATININE 0.93 12/23/2017   CREATININE 0.69 11/24/2017    Lab Results  Component Value Date   WBC 4.2 01/24/2018   HGB 13.4 01/24/2018   HCT 38.7 01/24/2018   PLT 233.0 01/24/2018   GLUCOSE 152 (H) 01/24/2018   CHOL 197 11/06/2014   TRIG 138.0 11/06/2014   HDL 52.50 11/06/2014   LDLDIRECT 146.4 01/11/2013   LDLCALC 117 (H) 11/06/2014   ALT 16 12/23/2017   AST 17 12/23/2017   NA 134 (L) 01/24/2018   K 3.3 (L) 01/24/2018   CL 96 01/24/2018   CREATININE 0.93 01/24/2018   BUN 17 01/24/2018   CO2 27 01/24/2018   TSH 2.30 11/24/2017   INR  1.0 11/24/2017   HGBA1C 5.8 11/24/2017    Ct Chest Wo Contrast  Result Date: 02/28/2018 CLINICAL DATA:  Persistent cough, possible recurrent aspiration, previous study demonstrated patchy opacities and bronchiectatic change, history of bilateral breast carcinoma EXAM: CT CHEST WITHOUT CONTRAST TECHNIQUE: Multidetector CT imaging of the chest was performed following the standard protocol without IV contrast. COMPARISON:  CT chest of 11/25/2016 FINDINGS: Cardiovascular: On this unenhanced study, moderate thoracic aortic atherosclerosis is present. The mid ascending thoracic aorta measures 35 mm in diameter. The heart is mildly enlarged and stable. Pericardial and epicardial fat is noted. Mediastinum/Nodes: No mediastinal or hilar adenopathy is seen. The thyroid gland is not well visualized but appears unremarkable on the images obtained. There is some dilatation  of the distal esophagus which may be due to reflux and mild esophagitis versus small hiatal hernia. Correlate clinically. Lungs/Pleura: On lung window images, apical pleuroparenchymal scarring remains. Probable radiation fibrosis is noted anteriorly in the left upper lobe and minimally in the anterior right upper lobe is well. Within the right upper lobe on image number 51 series 3 a 3 mm noncalcified nodule is present, not seen previously. On sagittal images this nodule is associated with the minor fissure and probably represents a small perifissural lymph node. This may be postinflammatory or postinfectious, but in view of the patient's symptoms follow-up recommendations will be given below. The parenchymal opacity within the posteromedial right lower lobe noted previously has improved slightly, some which most likely represent scarring with mild residual bronchiectasis as described previously. The opacity previously noted within the anterior left lower lobe has improved. However there has been worsening of opacity in the posterior left lower lobe and  there is a small parenchymal opacity in the right lower lobe posteriorly which was not seen previously. These findings suggest multifocal pneumonia. No pleural effusion is seen. The central airway is patent. Upper Abdomen: As noted previously and there does appear to be mild hepatic steatosis of the liver with some sparing near the gallbladder. No other abnormality is noted within images through the upper abdomen. Musculoskeletal: On bone window images, there are degenerative changes throughout the mid to lower thoracic spine. No acute compression deformity is seen. No lytic or blastic bony lesion is evident. IMPRESSION: 1. There are patchy opacities bilaterally, some of which are new, most consistent with multifocal pneumonia. 2. Mild radiation fibrosis anteriorly in both anterior upper lobes left-greater-than-right. 3. 3 mm nodule in the right upper lobe is associated with the minor fissure and may represent a small perifissural lymph node, not seen previously. Follow-up in 4-6 months may be helpful to assess stability in view of the patient's history however. 4. Mild hepatic steatosis as described previously. 5. Moderate thoracic aortic atherosclerosis.  Cardiomegaly. Electronically Signed   By: Ivar Drape M.D.   On: 02/28/2018 09:54    Assessment & Plan:   Problem List Items Addressed This Visit    None      I have discontinued Heidi Walsh's azithromycin, KLOR-CON M20, levofloxacin, and clindamycin. I am also having her maintain her cetirizine, multivitamin, calcium-vitamin D, aspirin, metoprolol succinate, docusate sodium, ALPRAZolam, buPROPion, albuterol, sodium chloride HYPERTONIC, and hydrochlorothiazide.  No orders of the defined types were placed in this encounter.   Medications Discontinued During This Encounter  Medication Reason  . azithromycin (ZITHROMAX) 500 MG tablet Completed Course  . clindamycin (CLEOCIN) 300 MG capsule Completed Course  . KLOR-CON M20 20 MEQ tablet Patient  Preference  . levofloxacin (LEVAQUIN) 500 MG tablet Completed Course    Follow-up: No follow-ups on file.   Crecencio Mc, MD

## 2018-05-20 DIAGNOSIS — J479 Bronchiectasis, uncomplicated: Secondary | ICD-10-CM | POA: Insufficient documentation

## 2018-05-20 LAB — CBC WITH DIFFERENTIAL/PLATELET
ABSOLUTE MONOCYTES: 626 {cells}/uL (ref 200–950)
BASOS PCT: 1.3 %
Basophils Absolute: 70 cells/uL (ref 0–200)
EOS PCT: 4.1 %
Eosinophils Absolute: 221 cells/uL (ref 15–500)
HEMATOCRIT: 38.6 % (ref 35.0–45.0)
Hemoglobin: 13.7 g/dL (ref 11.7–15.5)
LYMPHS ABS: 1636 {cells}/uL (ref 850–3900)
MCH: 33 pg (ref 27.0–33.0)
MCHC: 35.5 g/dL (ref 32.0–36.0)
MCV: 93 fL (ref 80.0–100.0)
MPV: 11.1 fL (ref 7.5–12.5)
Monocytes Relative: 11.6 %
NEUTROS PCT: 52.7 %
Neutro Abs: 2846 cells/uL (ref 1500–7800)
PLATELETS: 267 10*3/uL (ref 140–400)
RBC: 4.15 10*6/uL (ref 3.80–5.10)
RDW: 12.4 % (ref 11.0–15.0)
Total Lymphocyte: 30.3 %
WBC: 5.4 10*3/uL (ref 3.8–10.8)

## 2018-05-20 LAB — SEDIMENTATION RATE: SED RATE: 22 mm/h (ref 0–30)

## 2018-05-20 LAB — C-REACTIVE PROTEIN: CRP: 4.3 mg/L (ref <8.0)

## 2018-05-20 NOTE — Assessment & Plan Note (Signed)
Etiology unclear.  She has a history of breast irradation for treatment of breast cancer.  She has not had a bronchoscopy and has had  an increase in cough without sputum production or fever.   Given her normal CBC, ESR and CRP and unchanged chest x ray ,  I have advised her NOT to start the azithromycin that was sent to her pharmacy.   She was advised by [facility] in December  to repeat the CT in one month but it was not ordered.  She is requesting a second opinion on her condition.  CT chest and referral to [facility] in progress

## 2018-05-25 ENCOUNTER — Encounter: Payer: Self-pay | Admitting: Pulmonary Disease

## 2018-05-25 ENCOUNTER — Other Ambulatory Visit
Admission: RE | Admit: 2018-05-25 | Discharge: 2018-05-25 | Disposition: A | Discharge status: Home / Self Care | Payer: Medicare Other | Source: Ambulatory Visit | Attending: Pulmonary Disease | Admitting: Pulmonary Disease

## 2018-05-25 ENCOUNTER — Telehealth: Payer: Self-pay

## 2018-05-25 ENCOUNTER — Other Ambulatory Visit: Payer: Self-pay

## 2018-05-25 ENCOUNTER — Ambulatory Visit: Payer: Medicare Other | Admitting: Pulmonary Disease

## 2018-05-25 DIAGNOSIS — Z853 Personal history of malignant neoplasm of breast: Secondary | ICD-10-CM | POA: Diagnosis not present

## 2018-05-25 DIAGNOSIS — J479 Bronchiectasis, uncomplicated: Secondary | ICD-10-CM

## 2018-05-25 DIAGNOSIS — R911 Solitary pulmonary nodule: Secondary | ICD-10-CM | POA: Diagnosis not present

## 2018-05-25 DIAGNOSIS — R918 Other nonspecific abnormal finding of lung field: Secondary | ICD-10-CM

## 2018-05-25 DIAGNOSIS — R0609 Other forms of dyspnea: Secondary | ICD-10-CM | POA: Insufficient documentation

## 2018-05-25 LAB — BRAIN NATRIURETIC PEPTIDE: B Natriuretic Peptide: 111 pg/mL — ABNORMAL HIGH (ref 0.0–100.0)

## 2018-05-25 NOTE — Telephone Encounter (Signed)
-----   Message from Wilhelmina Mcardle, MD sent at 05/25/2018  3:59 PM EST ----- Let her know that her BNP blood test is normal. No further evaluation is needed at this time  Thanks  Waunita Schooner

## 2018-05-25 NOTE — Telephone Encounter (Signed)
Pt is aware of results and voiced her understanding. Nothing further is needed.  

## 2018-05-25 NOTE — Patient Instructions (Addendum)
Blood test today: BNP (this screens for heart failure).  We will contact you with results if this is abnormal  Follow-up in 4 months with repeat CT scan of chest prior to that visit to reevaluate the incidental finding of right upper lobe nodule  You may experiment with albuterol nebulizer to see if it helps relieve any symptoms of shortness of breath  Call this office in the interim for any respiratory, breathing or lung problems.

## 2018-05-30 NOTE — Progress Notes (Signed)
PULMONARY CONSULT NOTE  Requesting MD/Service: Derrel Nip Date of initial consultation: 05/25/18 Reason for consultation: Bronchiectasis  PT PROFILE: 73 y.o. female never smoker with hx of breast cancer, B mastectomy, XRT to L hemithorax referred for eval of abnormal CT chest and DOE  DATA: 11/25/17 CT chest: Area of cylindrical bronchiectasis and patchy density in the medial aspect of the right lower lobe. The patchy density could represent scarring, inflammation or infection. Small amount of patchy density in the left lower lobe laterally and inferiorly. This could also represent scarring, inflammation or infection 02/28/18 CT chest: There are patchy opacities bilaterally, some of which are new, most consistent with multifocal pneumonia. Mild radiation fibrosis anteriorly in both anterior upper lobes left-greater-than-right. 3 mm nodule in the right upper lobe is associated with the minor fissure and may represent a small perifissural lymph node, not seen previously. Follow-up in 4-6 months may be helpful to assess stability in view of the patient's history 04/25/18 Arlyce Harman [facility]): normal   INTERVAL:  HPI:  Her history dates to 11/2017 when she had a "routine physical" which included a CXR revealing a possible RUL opacity. This was followed by CT chest as reported above. She was treated for "atypical pneumonia" with azithromycin and underwent repeat CT chest in 02/2018 with findings as documented above. She received more antibiotics and was referred to [facility] Medicine since she had gotten her oncology care @ [facility]. She was told that she has "radiation fibrosis" and bronchiectasis. She was prescribed nebulized albuterol and hypertonic saline. These have not provided her any discernible benefit. She has minimal cough and no significant sputum production. She has never had hemoptysis. She denies pleuritic CP but does have occasional "chest heaviness". She also reports episodic DOE over the past 3-4  months which is generally mild in severity and not affected by albuterol.  She does report throat clearing cough, occasional dysphagia and rare HB symptoms.  She is retired with no history of significant occupational exposures. She has a dog in the home. She was never in the [employer] and has no recent travel to 3rd world countries. She has been tested for TB as recently as approx 7 years ago and has never tested positive. SHe has no known TB exposures.  Past Medical History:  Diagnosis Date  . Actinic keratosis    managed by Darlis Loan  . Atrial fibrillation [facility]) 2010   Followed by Dr. Jordan Hawks  . Bladder cancer ([place])   . breast cancer 2010   s/p mastectomy  . Breast cyst 2003   aspirated, fibroystics breasts with stable calcifications on left breast  . Hypertension   . Leukopenia   . Mitral regurgitation    and prolapse  . Osteopenia 56   Stable by repeat scans    Past Surgical History:  Procedure Laterality Date  . ATRIAL ABLATION SURGERY  Dec 2012   [facility]  . CESAREAN SECTION     x 2  . JOINT REPLACEMENT    . KNEE ARTHROSCOPY W/ MENISCECTOMY  1997  . MASTECTOMY  02/2009   Bilateral  . MASTECTOMY     Right and Left  . SHOULDER SURGERY     left rotator cuff  . TOTAL KNEE ARTHROPLASTY  02/2014   Left knee    MEDICATIONS: I have reviewed all medications and confirmed regimen as documented  Social History   Socioeconomic History  . Marital status: Married    Spouse name: Not on file  . Number of children: Not on  file  . Years of education: Not on file  . Highest education level: Not on file  Occupational History  . Not on file  Social Needs  . Financial resource strain: Not hard at all  . Food insecurity:    Worry: Never true    Inability: Never true  . Transportation needs:    Medical: No    Non-medical: No  Tobacco Use  . Smoking status: Never Smoker  . Smokeless tobacco: Never Used  Substance and Sexual Activity  . Alcohol use: Yes    Alcohol/week:  0.0 standard drinks    Comment: occ  . Drug use: No  . Sexual activity: Not on file  Lifestyle  . Physical activity:    Days per week: 2 days    Minutes per session: 10 min  . Stress: Not at all  Relationships  . Social connections:    Talks on phone: Not on file    Gets together: Not on file    Attends religious service: Not on file    Active member of club or organization: Not on file    Attends meetings of clubs or organizations: Not on file    Relationship status: Not on file  . Intimate partner violence:    Fear of current or ex partner: No    Emotionally abused: No    Physically abused: No    Forced sexual activity: No  Other Topics Concern  . Not on file  Social History Narrative  . Not on file    Family History  Problem Relation Age of Onset  . COPD Mother   . Hypertension Father   . Hepatitis Sister        Hepatitis C from a dental procedure    ROS: No fever, myalgias/arthralgias, unexplained weight loss or weight gain No new focal weakness or sensory deficits No otalgia, hearing loss, visual changes, nasal and sinus symptoms, mouth and throat problems No neck pain or adenopathy No abdominal pain, N/V/D, diarrhea, change in bowel pattern No dysuria, change in urinary pattern   Vitals:   05/25/18 1130  BP: (!) 142/84  Pulse: 84  SpO2: 97%  Weight: 222 lb 9.6 oz (101 kg)  Height: 5\' 7"  (1.702 m)     EXAM:  Gen: WDWN, No overt respiratory distress HEENT: NCAT, sclera white, oropharynx normal Neck: Supple without LAN, thyromegaly, JVD Lungs: breath sounds full, percussion normal, adventitious sounds: none Cardiovascular: Reg with occasional extrasystoles, no murmurs noted Abdomen: Soft, nontender, normal BS Ext: without clubbing, cyanosis, edema Neuro: CNs grossly intact, motor and sensory intact Skin: Limited exam, no lesions noted  DATA:   BMP Latest Ref Rng & Units 01/24/2018 12/23/2017 11/24/2017  Glucose 70 - 99 mg/dL 152(H) 99 101(H)  BUN 6  - 23 mg/dL 17 21 12   Creatinine 0.40 - 1.20 mg/dL 0.93 0.93 0.69  Sodium 135 - 145 mEq/L 134(L) 138 139  Potassium 3.5 - 5.1 mEq/L 3.3(L) 3.6 3.2(L)  Chloride 96 - 112 mEq/L 96 100 103  CO2 19 - 32 mEq/L 27 29 26   Calcium 8.4 - 10.5 mg/dL 9.6 9.3 9.5    CBC Latest Ref Rng & Units 05/19/2018 01/24/2018 11/24/2017  WBC 3.8 - 10.8 Thousand/uL 5.4 4.2 4.2  Hemoglobin 11.7 - 15.5 g/dL 13.7 13.4 14.4  Hematocrit 35.0 - 45.0 % 38.6 38.7 41.7  Platelets 140 - 400 Thousand/uL 267 233.0 224.0    CXR:    I have personally reviewed all chest radiographs reported above including  CXRs and CT chest unless otherwise indicated  IMPRESSION:     ICD-10-CM   1. Very mild bronchiectasis without complication ([place]) G40.1   2. Incidental RUL lung nodule R91.1   3. DOE (dyspnea on exertion) R06.09 B Nat Peptide  4. History of breast cancer Z85.3   5. Abnormal findings on diagnostic imaging of lung R91.8 CT CHEST WO CONTRAST   Bronchiectasis is likely not contributing to any symptoms. It is "dry bronchiectasis" and therefore, she is likely attaining no benefit from nebulized hypertonic saline. Likewise, she can discern no benefit from nebulized albuterol  PLAN:  BNP today  She is instructed that she may experiment on and off albuterol to decide whether it helps dypnea  DC nebulized hypertonic saline  Repeat CT chest in 4 months (6 months since last one) with follow up in this office after that  She is to call this office in the interim for any lung, chest or respiratory problems   Merton Border, MD PCCM service Mobile [phone] Pager [phone] 05/30/2018 9:06 PM

## 2018-06-07 ENCOUNTER — Other Ambulatory Visit: Payer: Self-pay | Admitting: Internal Medicine

## 2018-06-19 ENCOUNTER — Telehealth: Payer: Self-pay | Admitting: Pulmonary Disease

## 2018-06-19 NOTE — Telephone Encounter (Signed)
Returning call CB# [phone]//[phone]

## 2018-06-19 NOTE — Telephone Encounter (Signed)
-  Continue cetirizine daily -Monitor symptoms including temperature -Use albuterol as needed for increased SOB, chest tightness, cough, wheezing -Call back if she develops fever, worsening SOB or purulent sputum  Thanks  Waunita Schooner

## 2018-06-19 NOTE — Telephone Encounter (Signed)
Called and spoke to pt. Pt reports of increased non prod cough & itchy face. Cough has worsen since yesterday. Pt did go outside yesterday, and feels that cough may be related pollen, as wind was blowing.  Denies wheezing, fever, chills, sweats, or recent travel. Sob is baseline. Not currently taking any OTC medications to help with cough or taking albuterol neb solution.  Pt is taking zyrtec daily.   Pt is requesting recommendations.

## 2018-06-19 NOTE — Telephone Encounter (Signed)
Pt is aware of below recommendations and voiced her understanding. Nothing further is needed.  

## 2018-06-22 ENCOUNTER — Other Ambulatory Visit: Payer: Self-pay | Admitting: Internal Medicine

## 2018-09-26 ENCOUNTER — Ambulatory Visit
Admission: RE | Admit: 2018-09-26 | Discharge: 2018-09-26 | Disposition: A | Discharge status: Home / Self Care | Payer: Medicare Other | Source: Ambulatory Visit | Attending: Pulmonary Disease | Admitting: Pulmonary Disease

## 2018-09-26 ENCOUNTER — Other Ambulatory Visit: Payer: Self-pay

## 2018-09-26 DIAGNOSIS — R918 Other nonspecific abnormal finding of lung field: Secondary | ICD-10-CM | POA: Diagnosis not present

## 2018-10-04 ENCOUNTER — Ambulatory Visit: Payer: Medicare Other | Admitting: Pulmonary Disease

## 2018-10-12 ENCOUNTER — Telehealth: Payer: Self-pay | Admitting: Pulmonary Disease

## 2018-10-12 NOTE — Telephone Encounter (Signed)

## 2018-10-13 ENCOUNTER — Encounter: Payer: Self-pay | Admitting: Pulmonary Disease

## 2018-10-13 ENCOUNTER — Ambulatory Visit: Payer: Medicare Other | Admitting: Pulmonary Disease

## 2018-10-13 ENCOUNTER — Other Ambulatory Visit: Payer: Self-pay

## 2018-10-13 VITALS — BP 122/82 | HR 83 | Temp 97.7°F | Ht 67.0 in | Wt 224.2 lb

## 2018-10-13 DIAGNOSIS — J479 Bronchiectasis, uncomplicated: Secondary | ICD-10-CM | POA: Diagnosis not present

## 2018-10-13 DIAGNOSIS — R0789 Other chest pain: Secondary | ICD-10-CM

## 2018-10-13 DIAGNOSIS — R911 Solitary pulmonary nodule: Secondary | ICD-10-CM | POA: Diagnosis not present

## 2018-10-13 MED ORDER — PANTOPRAZOLE SODIUM 40 MG PO TBEC: 40.0000 mg | DELAYED_RELEASE_TABLET | Freq: Every day | ORAL | 1 refills | Status: DC | PRN

## 2018-10-13 NOTE — Patient Instructions (Signed)
No further evaluation of lung nodule is warranted at this time Recommend 2-3 weeks trial of Protonix 40 mg daily after dinner or before bedtime After trial of Protonix, may continue medication as needed for symptoms of acid reflux  Follow-up in this office as needed

## 2018-10-13 NOTE — Progress Notes (Signed)
PULMONARY CONSULT NOTE  Requesting MD/Service: Derrel Nip Date of initial consultation: 05/25/18 Reason for consultation: Bronchiectasis  PT PROFILE: 73 y.o. female never smoker with hx of breast cancer, B mastectomy, XRT to L hemithorax referred for eval of abnormal CT chest and DOE  DATA: 11/25/17 CT chest: Area of cylindrical bronchiectasis and patchy density in the medial aspect of the right lower lobe. The patchy density could represent scarring, inflammation or infection. Small amount of patchy density in the left lower lobe laterally and inferiorly. This could also represent scarring, inflammation or infection 02/28/18 CT chest: There are patchy opacities bilaterally, some of which are new, most consistent with multifocal pneumonia. Mild radiation fibrosis anteriorly in both anterior upper lobes left-greater-than-right. 3 mm nodule in the right upper lobe is associated with the minor fissure and may represent a small perifissural lymph node, not seen previously. Follow-up in 4-6 months may be helpful to assess stability in view of the patient's history 04/25/18 Arlyce Harman [facility]): normal 09/26/18 CT chest: Right upper lobe nodule noted on prior exam is not visualized currently. Stable probable radiation fibrosis seen anteriorly in left upper lobe. Stable probable right lower lobe scarring is noted  INTERVAL: Initial consultation 05/25/2018.  No major events since that time.  SUBJ:  This is a scheduled follow-up.  She is here to review results of repeat CT scan of her chest.  She has no new respiratory symptoms.  She does experience intermittent chest pressure which is nonexertional.  It occurs during the day more than at nighttime.  It might be stress related.  She does have some dysphagia (approximately 2 occurrences per week).    Vitals:   10/13/18 0822 10/13/18 0828  BP:  122/82  Pulse:  83  Temp:  97.7 F (36.5 C)  TempSrc:  Temporal  SpO2:  97%  Weight: 224 lb 3.2 oz (101.7 kg)    Height: 5\' 7"  (1.702 m)      EXAM:  Gen: NAD HEENT: NCAT, sclerae white Neck: No JVD Lungs: breath sounds full, no wheezes or other adventitious sounds Cardiovascular: Frequent extrasystoles, no murmur noted Abdomen: Soft, nontender, normal BS Ext: without clubbing, cyanosis, edema Neuro: grossly intact Skin: Limited exam, no lesions noted   DATA:   BMP Latest Ref Rng & Units 01/24/2018 12/23/2017 11/24/2017  Glucose 70 - 99 mg/dL 152(H) 99 101(H)  BUN 6 - 23 mg/dL 17 21 12   Creatinine 0.40 - 1.20 mg/dL 0.93 0.93 0.69  Sodium 135 - 145 mEq/L 134(L) 138 139  Potassium 3.5 - 5.1 mEq/L 3.3(L) 3.6 3.2(L)  Chloride 96 - 112 mEq/L 96 100 103  CO2 19 - 32 mEq/L 27 29 26   Calcium 8.4 - 10.5 mg/dL 9.6 9.3 9.5    CBC Latest Ref Rng & Units 05/19/2018 01/24/2018 11/24/2017  WBC 3.8 - 10.8 Thousand/uL 5.4 4.2 4.2  Hemoglobin 11.7 - 15.5 g/dL 13.7 13.4 14.4  Hematocrit 35.0 - 45.0 % 38.6 38.7 41.7  Platelets 140 - 400 Thousand/uL 267 233.0 224.0    CXR: No new film  I have personally reviewed all chest radiographs reported above including CXRs and CT chest unless otherwise indicated  IMPRESSION:     ICD-10-CM   1. Lung nodule, no longer visible on CT scan  R91.1   2. Very mild bronchiectasis, asymptomatic   J47.9   3. Chest pressure and dysphagia, likely GERD  R07.89    The most recent CT scan (documented above) was reviewed in detail with the patient and her husband who  is a retired Engineer, drilling.  The previously seen lung nodule is not visible on the most recent CT scan of the chest.  It was extremely small and might have simply been missed.  Alternatively, it could have resolved.  She continues to have very mild radiation fibrosis in the LUL and very mild bronchiectasis in the RUL.  Her bronchiectasis is asymptomatic.  Her current symptoms of chest tightness with occasional dysphagia likely represent GERD versus esophageal dysmotility  PLAN:  No further evaluation of lung nodule is  warranted at this time  I have recommended a 2-3-week trial of PPI therapy to see if this helps resolve the episodic chest tightness.  I placed a prescription for Protonix 40 mg daily to be taken after dinner or before bedtime  Follow-up in this office as needed.   Merton Border, MD PCCM service Mobile [phone] Pager [phone] 10/13/2018 1:49 PM

## 2018-11-06 ENCOUNTER — Other Ambulatory Visit: Payer: Self-pay | Admitting: Pulmonary Disease

## 2018-11-12 ENCOUNTER — Other Ambulatory Visit: Payer: Self-pay | Admitting: Internal Medicine

## 2018-11-15 ENCOUNTER — Ambulatory Visit: Payer: Self-pay | Admitting: *Deleted

## 2018-11-15 NOTE — Telephone Encounter (Signed)
Spoke with pt to see if she had been to UC yet and she stated that she had not but that she was about to go.

## 2018-11-15 NOTE — Telephone Encounter (Signed)
Spoke with pt on the phone and she is going to UC.

## 2018-11-15 NOTE — Telephone Encounter (Signed)
  Patient is calling with abdominal pain since Monday- pain is constant with intermittent cramping. Patient states there is nothing that seems to be helping and she is having some nausea occasionally. Patient denies fever, constipation, diarrhea.  Call to office- there are no appointments available in line with protocol- advised patient to seek care at [facility]- office is aware. Reason for Disposition . [1] MILD-MODERATE pain AND [2] constant AND [3] present > 2 hours  Answer Assessment - Initial Assessment Questions 1. LOCATION: "Where does it hurt?"      Center of abdomin- lower 2. RADIATION: "Does the pain shoot anywhere else?" (e.g., chest, back)     Has bothered patient's back 3. ONSET: "When did the pain begin?" (e.g., minutes, hours or days ago)      Started on Monday 4. SUDDEN: "Gradual or sudden onset?"     Not sure- associated with abdominal cramping 5. PATTERN "Does the pain come and go, or is it constant?"    - If constant: "Is it getting better, staying the same, or worsening?"      (Note: Constant means the pain never goes away completely; most serious pain is constant and it progresses)     - If intermittent: "How long does it last?" "Do you have pain now?"     (Note: Intermittent means the pain goes away completely between bouts)     Pain is constant- cramping comes and goes 6. SEVERITY: "How bad is the pain?"  (e.g., Scale 1-10; mild, moderate, or severe)   - MILD (1-3): doesn't interfere with normal activities, abdomen soft and not tender to touch    - MODERATE (4-7): interferes with normal activities or awakens from sleep, tender to touch    - SEVERE (8-10): excruciating pain, doubled over, unable to do any normal activities      6- moderate- 8 - when cramping 7. RECURRENT SYMPTOM: "Have you ever had this type of abdominal pain before?" If so, ask: "When was the last time?" and "What happened that time?"      Only when impacted- patient has been using bathroom having regular  BM 8. CAUSE: "What do you think is causing the abdominal pain?"     Not sure 9. RELIEVING/AGGRAVATING FACTORS: "What makes it better or worse?" (e.g., movement, antacids, bowel movement)     No- nothing makes it better 10. OTHER SYMPTOMS: "Has there been any vomiting, diarrhea, constipation, or urine problems?"       nausea 11. PREGNANCY: "Is there any chance you are pregnant?" "When was your last menstrual period?"       n/a  Protocols used: ABDOMINAL PAIN - [facility]

## 2018-12-06 ENCOUNTER — Other Ambulatory Visit: Payer: Self-pay | Admitting: Pulmonary Disease

## 2018-12-27 ENCOUNTER — Ambulatory Visit: Payer: Medicare Other

## 2018-12-27 ENCOUNTER — Ambulatory Visit: Payer: Medicare Other | Admitting: Internal Medicine

## 2018-12-28 ENCOUNTER — Other Ambulatory Visit: Payer: Self-pay | Admitting: Pulmonary Disease

## 2019-01-05 ENCOUNTER — Other Ambulatory Visit: Payer: Self-pay

## 2019-01-15 ENCOUNTER — Other Ambulatory Visit: Payer: Self-pay

## 2019-01-16 ENCOUNTER — Telehealth: Payer: Self-pay

## 2019-01-16 ENCOUNTER — Ambulatory Visit (INDEPENDENT_AMBULATORY_CARE_PROVIDER_SITE_OTHER): Payer: Medicare Other | Admitting: Internal Medicine

## 2019-01-16 ENCOUNTER — Encounter: Payer: Self-pay | Admitting: Internal Medicine

## 2019-01-16 ENCOUNTER — Other Ambulatory Visit: Payer: Self-pay

## 2019-01-16 ENCOUNTER — Ambulatory Visit (INDEPENDENT_AMBULATORY_CARE_PROVIDER_SITE_OTHER): Payer: Medicare Other

## 2019-01-16 VITALS — BP 112/77 | HR 79 | Temp 97.1°F | Resp 15 | Ht 67.0 in | Wt 222.0 lb

## 2019-01-16 DIAGNOSIS — R911 Solitary pulmonary nodule: Secondary | ICD-10-CM

## 2019-01-16 DIAGNOSIS — R0789 Other chest pain: Secondary | ICD-10-CM

## 2019-01-16 DIAGNOSIS — E876 Hypokalemia: Secondary | ICD-10-CM

## 2019-01-16 DIAGNOSIS — Z0000 Encounter for general adult medical examination without abnormal findings: Secondary | ICD-10-CM | POA: Diagnosis not present

## 2019-01-16 DIAGNOSIS — C679 Malignant neoplasm of bladder, unspecified: Secondary | ICD-10-CM

## 2019-01-16 DIAGNOSIS — R000 Tachycardia, unspecified: Secondary | ICD-10-CM | POA: Diagnosis not present

## 2019-01-16 DIAGNOSIS — I4811 Longstanding persistent atrial fibrillation: Secondary | ICD-10-CM

## 2019-01-16 DIAGNOSIS — R079 Chest pain, unspecified: Secondary | ICD-10-CM | POA: Insufficient documentation

## 2019-01-16 DIAGNOSIS — J479 Bronchiectasis, uncomplicated: Secondary | ICD-10-CM

## 2019-01-16 DIAGNOSIS — F320 Major depressive disorder, single episode, mild: Secondary | ICD-10-CM

## 2019-01-16 DIAGNOSIS — T502X5A Adverse effect of carbonic-anhydrase inhibitors, benzothiadiazides and other diuretics, initial encounter: Secondary | ICD-10-CM | POA: Insufficient documentation

## 2019-01-16 LAB — BASIC METABOLIC PANEL
BUN: 16 mg/dL (ref 6–23)
CO2: 28 mEq/L (ref 19–32)
Calcium: 9.4 mg/dL (ref 8.4–10.5)
Chloride: 98 mEq/L (ref 96–112)
Creatinine, Ser: 0.68 mg/dL (ref 0.40–1.20)
GFR: 85.23 mL/min (ref >60.00)
Glucose, Bld: 109 mg/dL — ABNORMAL HIGH (ref 70–99)
Potassium: 3.4 mEq/L — ABNORMAL LOW (ref 3.5–5.1)
Sodium: 136 mEq/L (ref 135–145)

## 2019-01-16 LAB — MAGNESIUM: Magnesium: 2.1 mg/dL (ref 1.5–2.5)

## 2019-01-16 MED ORDER — PREDNISONE 10 MG PO TABS: ORAL_TABLET | ORAL | 0 refills | Status: DC

## 2019-01-16 NOTE — Assessment & Plan Note (Signed)
Workup underway for cause.  Abnormal stress ECHO ; cardiac CT done today

## 2019-01-16 NOTE — Telephone Encounter (Signed)
Called patient for scheduled AWV. No answer. Will follow as appropriate. Reschedule as needed.

## 2019-01-16 NOTE — Assessment & Plan Note (Addendum)
Taking hctz and potassium supplements added in late August for K of 3.0 .  Repeat today along with a mg level; if still elevated should have medication change.

## 2019-01-16 NOTE — Progress Notes (Addendum)
Subjective:   Heidi Walsh is a 73 y.o. female who presents for Medicare Annual (Subsequent) preventive examination.  Review of Systems:  No ROS.  Medicare Wellness Virtual Visit.  Visual/audio telehealth visit, UTA vital signs.   See social history for additional risk factors.   Cardiac Risk Factors include: advanced age (>18men, >5 women)     Objective:     Vitals: There were no vitals taken for this visit.  There is no height or weight on file to calculate BMI.  Advanced Directives 01/16/2019 12/22/2017 12/21/2016 08/20/2015  Does Patient Have a Medical Advance Directive? Yes Yes Yes Yes  Type of Paramedic of [place];Living will [place];Living will [place];Living will [place];Living will  Does patient want to make changes to medical advance directive? No - Patient declined No - Patient declined No - Patient declined No - Patient declined  Copy of [place] in Chart? No - copy requested No - copy requested No - copy requested -    Tobacco Social History   Tobacco Use  Smoking Status Never Smoker  Smokeless Tobacco Never Used     Counseling given: Not Answered   Clinical Intake:  Pre-visit preparation completed: Yes        Diabetes: No  How often do you need to have someone help you when you read instructions, pamphlets, or other written materials from your doctor or pharmacy?: 1 - Never  Interpreter Needed?: No     Past Medical History:  Diagnosis Date  . Actinic keratosis    managed by Darlis Loan  . Atrial fibrillation [facility]) 2010   Followed by Dr. Jordan Hawks  . Bladder cancer ([place])   . breast cancer 2010   s/p mastectomy  . Breast cyst 2003   aspirated, fibroystics breasts with stable calcifications on left breast  . Hypertension   . Leukopenia   . Mitral regurgitation    and prolapse  . Osteopenia 56   Stable by repeat scans   Past  Surgical History:  Procedure Laterality Date  . ATRIAL ABLATION SURGERY  Dec 2012   [facility]  . CESAREAN SECTION     x 2  . JOINT REPLACEMENT    . KNEE ARTHROSCOPY W/ MENISCECTOMY  1997  . MASTECTOMY  02/2009   Bilateral  . MASTECTOMY     Right and Left  . SHOULDER SURGERY     left rotator cuff  . TOTAL KNEE ARTHROPLASTY  02/2014   Left knee   Family History  Problem Relation Age of Onset  . COPD Mother   . Hypertension Father   . Hepatitis Sister        Hepatitis C from a dental procedure   Social History   Socioeconomic History  . Marital status: Married    Spouse name: Not on file  . Number of children: Not on file  . Years of education: Not on file  . Highest education level: Not on file  Occupational History  . Not on file  Social Needs  . Financial resource strain: Not hard at all  . Food insecurity    Worry: Never true    Inability: Never true  . Transportation needs    Medical: No    Non-medical: No  Tobacco Use  . Smoking status: Never Smoker  . Smokeless tobacco: Never Used  Substance and Sexual Activity  . Alcohol use: Yes    Alcohol/week: 0.0 standard  drinks    Comment: occ  . Drug use: No  . Sexual activity: Not on file  Lifestyle  . Physical activity    Days per week: 2 days    Minutes per session: 10 min  . Stress: Not at all  Relationships  . Social Herbalist on phone: Not on file    Gets together: Not on file    Attends religious service: Not on file    Active member of club or organization: Not on file    Attends meetings of clubs or organizations: Not on file    Relationship status: Not on file  Other Topics Concern  . Not on file  Social History Narrative  . Not on file    Outpatient Encounter Medications as of 01/16/2019  Medication Sig  . ALPRAZolam (XANAX) 0.25 MG tablet TAKE 1 TABLET BY MOUTH 2 TIMES DAILY AS NEEDED FOR ANXIETY  . aspirin 81 MG tablet Take 81 mg by mouth 2 (two) times daily.  Marland Kitchen buPROPion  (WELLBUTRIN SR) 200 MG 12 hr tablet TAKE 1 TABLET BY MOUTH TWICE A DAY  . Calcium Carbonate-Vitamin D (CALCIUM-VITAMIN D) 500-200 MG-UNIT per tablet Take 1 tablet by mouth 2 (two) times daily with a meal.    . cetirizine (ZYRTEC) 10 MG tablet Take 10 mg by mouth daily.    Marland Kitchen docusate sodium (COLACE) 100 MG capsule Take 100 mg by mouth 2 (two) times daily.  . hydrochlorothiazide (HYDRODIURIL) 50 MG tablet Take 50 mg by mouth daily.  . metoprolol succinate (TOPROL-XL) 25 MG 24 hr tablet Take 25 mg by mouth daily.  . Multiple Vitamin (MULTIVITAMIN) tablet Take 1 tablet by mouth daily.    . [DISCONTINUED] pantoprazole (PROTONIX) 40 MG tablet TAKE 1 TABLET BY MOUTH DAILY AS NEEDED  . [DISCONTINUED] albuterol (PROVENTIL) (2.5 MG/3ML) 0.083% nebulizer solution INHALE 3 ML (2.5 MG TOTAL) BY NEBULIZATION EVERY FOUR (4) HOURS AS NEEDED FOR WHEEZING.  . [DISCONTINUED] sodium chloride HYPERTONIC 3 % nebulizer solution INHALE 4 ML BY NEBULIZATION TWO (2) TIMES A DAY.   No facility-administered encounter medications on file as of 01/16/2019.     Activities of Daily Living In your present state of health, do you have any difficulty performing the following activities: 01/16/2019  Hearing? N  Vision? N  Difficulty concentrating or making decisions? N  Walking or climbing stairs? N  Dressing or bathing? N  Doing errands, shopping? N  Preparing Food and eating ? N  Using the Toilet? N  In the past six months, have you accidently leaked urine? N  Do you have problems with loss of bowel control? N  Managing your Medications? N  Managing your Finances? N  Housekeeping or managing your Housekeeping? N  Some recent data might be hidden    Patient Care Team: Crecencio Mc, MD as PCP - General (Internal Medicine)    Assessment:   This is a routine wellness examination for Heidi Walsh.  Nurse connected with patient 01/16/19 at 12:30 PM EDT by a telephone enabled telemedicine application and verified that I am  speaking with the correct person using two identifiers. Patient stated full name and DOB. Patient gave permission to continue with virtual visit. Patient's location was at home and Nurse's location was at [place] office.   Health Maintenance Due: See completed HM at the end of note.   Eye: Visual acuity not assessed. Virtual visit. Followed by their ophthalmologist.  Dental: Visits every 12 months.  Hearing: Demonstrates normal  hearing during visit.  Safety:  Patient feels safe at home- yes Patient does have smoke detectors at home- yes Patient does wear sunscreen or protective clothing when in direct sunlight - yes Patient does wear seat belt when in a moving vehicle - yes Patient drives- yes Adequate lighting in walkways free from debris- yes Grab bars and handrails used as appropriate- yes Ambulates with no assistive device Cell phone on person when ambulating outside of the home- yes  Social: Alcohol intake - yes      Smoking history- never  Smokers in home? none Illicit drug use? none  Depression: PHQ 2 &9 complete. See screening below. Denies irritability, anhedonia, sadness/tearfullness.  Stable.   Falls: See screening below.    Medication: Taking as directed and without issues.   Covid-19: Precautions and sickness symptoms discussed. Wears mask, social distancing, hand hygiene as appropriate.   Activities of Daily Living Patient denies needing assistance with: household chores, feeding themselves, getting from bed to chair, getting to the toilet, bathing/showering, dressing, managing money, or preparing meals.   Memory: Patient is alert. Patient denies difficulty focusing or concentrating. Correctly identified the president of the [place], season and recall. Patient likes to read, paint, knitting and brain games for brain stimulation.   BMI- discussed the importance of a healthy diet, water intake and the benefits of aerobic exercise.  Educational material  provided.  Physical activity- Physical therapy twice weekly; lumbar extension twice daily 5 seconds.   Diet:  Low sodium  Water: good intake  Other Providers Patient Care Team: Crecencio Mc, MD as PCP - General (Internal Medicine) Exercise Activities and Dietary recommendations Current Exercise Habits: Home exercise routine, Type of exercise: stretching(physical therapy BID), Time (Minutes): 30, Frequency (Times/Week): 2, Weekly Exercise (Minutes/Week): 60, Intensity: Mild  Goals      Patient Stated   . Weight (lb) < 200 lb (90.7 kg) (pt-stated)     I want to walk for exercise       Fall Risk Fall Risk  01/16/2019 12/22/2017 12/21/2016 01/05/2016 10/28/2014  Falls in the past year? 0 Yes No No No  Number falls in past yr: 0 1 - - -  Injury with Fall? - No - - -  Comment - Uneven ground when walking outdoors. No injury.  - - -   Timed Get Up and Go performed: no, virtual visit  Depression Screen PHQ 2/9 Scores 01/16/2019 12/22/2017 12/21/2016 01/05/2016  PHQ - 2 Score 0 0 1 0  PHQ- 9 Score - - 2 -     Cognitive Function MMSE - Mini Mental State Exam 12/22/2017 12/21/2016  Orientation to time 5 5  Orientation to Place 5 5  Registration 3 3  Attention/ Calculation 5 5  Recall 3 3  Language- name 2 objects 2 2  Language- repeat 1 1  Language- follow 3 step command 3 3  Language- read & follow direction 1 1  Write a sentence 1 1  Copy design 1 1  Total score 30 30     6CIT Screen 01/16/2019  What Year? 0 points  What month? 0 points  What time? 0 points  Count back from 20 0 points  Months in reverse 0 points  Repeat phrase 0 points  Total Score 0    Immunization History  Administered Date(s) Administered  . Fluad Quad(high Dose 65+) 01/15/2019  . Influenza Split 02/23/2011, 02/20/2012  . Influenza Whole 02/04/2016  . Influenza, High Dose Seasonal PF 01/01/2015  . Influenza,inj,Quad  PF,6+ Mos 12/25/2012  . Influenza,inj,quad, With Preservative 12/20/2016,  12/20/2017  . Influenza-Unspecified 12/16/2016, 01/03/2018  . Pneumococcal Conjugate-13 09/20/2013  . Pneumococcal Polysaccharide-23 03/19/2014  . Tdap 11/24/2006, 12/27/2013  . Zoster 12/26/2007   Screening Tests Health Maintenance  Topic Date Due  . COLONOSCOPY  10/28/2022  . TETANUS/TDAP  12/28/2023  . INFLUENZA VACCINE  Completed  . DEXA SCAN  Completed  . Hepatitis C Screening  Completed  . PNA vac Low Risk Adult  Completed  . MAMMOGRAM  Discontinued      Plan:   Keep all routine maintenance appointments.   Follow up with your doctor today at 1:30  Medicare Attestation I have personally reviewed: The patient's medical and social history Their use of alcohol, tobacco or illicit drugs Their current medications and supplements The patient's functional ability including ADLs,fall risks, home safety risks, cognitive, and hearing and visual impairment Diet and physical activities Evidence for depression   In addition, I have reviewed and discussed with patient certain preventive protocols, quality metrics, and best practice recommendations. A written personalized care plan for preventive services as well as general preventive health recommendations were provided to patient via mail.     OBrien-Blaney, Philopater Mucha L, LPN  [id number]    I have reviewed the above information and agree with above.   Deborra Medina, MD

## 2019-01-16 NOTE — Patient Instructions (Signed)
We are repeating the prednisone course,  But take 60 mg daily for 3 days,,  Then begin the taper  If the potassium is still low, I'll recommend changing hctz to maxzide  Return for A cmet AND cbc 4 WEEKS AFTER STARTING YOUR BRAIN SUPPLEMENT

## 2019-01-16 NOTE — Assessment & Plan Note (Signed)
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  Has had 2nd opinion from [facility] pulmonology ,  No indication for antibiotics. At this time.

## 2019-01-16 NOTE — Progress Notes (Signed)
Subjective:  Patient ID: Heidi Walsh, female    DOB: Jul 28, 1945  Age: 73 y.o. MRN: [id number]  CC: The primary encounter diagnosis was Hypokalemia. Diagnoses of Tachycardia, Bronchiectasis without complication ([place]), Pulmonary nodule, right, Current mild episode of major depressive disorder without prior episode ([place]), Malignant neoplasm of urinary bladder, unspecified site [facility]), Longstanding persistent atrial fibrillation ([place]), Other chest pain, and Diuretic-induced hypokalemia were also pertinent to this visit.  HPI Heidi Walsh presents for follow up on medications and chronic issues  RECEIVED SHOT YESTERDAY   Cardiac CT done today., for further evaluation of abnormal stress ECHO done to investigate recurrent chest pain.  CT was done twice , received a total of 100 mg metoprolol and  A total of SL NTG x 3 for CT .    Recent:  Referred  to [facility] for bronchiectasis and RLL density, multifocal pneumonia noted on prior chest CT.   Most recent July 2020 :  RUL nodule not seen,  Radiation fibrosis seen in LUL and RLL notable for scarring .  PPI therapy recommended for chest tightness attributed to esophageal spasm,  But chest pain recurred on PPI and she saw  cardiology and CT cardiac done today  Atrial fib post ablation in 2014.  Managed with CPAP and Toprol XL and prn cardizem .  Had a prolonged episode of atrial fib with chest pain  On  August 26,  Noted to have a  potassium 3.0 .  Now taking a supplement and 3.6 on repeat check Sept 18 .  Taking hctz 50 mg daily per Dr Ubaldo Glassing for hypertension    Weight gain  Due to inactivity  Bilateral ischial tuberosity pain secondary to bursitis/tendonitis    No change in pain with a  medrol dose pack recently prescribed  by ortho.  Saw PT yesterday but ITs were  too painful to participate  Was Using a rebounder for exercise,  But had to stop due to inflammation . Taking turmeric and Sam E  For joint pain .NSAIDS C/i due to gastritis   Cc:   Worried about Brain function because of prior chemotherapy secondary to cancer x 2.  Has been researching various supplements and is requesting my opinion .     Outpatient Medications Prior to Visit  Medication Sig Dispense Refill  . ALPRAZolam (XANAX) 0.25 MG tablet TAKE 1 TABLET BY MOUTH 2 TIMES DAILY AS NEEDED FOR ANXIETY 60 tablet 5  . aspirin 81 MG tablet Take 81 mg by mouth 2 (two) times daily.    Marland Kitchen buPROPion (WELLBUTRIN SR) 200 MG 12 hr tablet TAKE 1 TABLET BY MOUTH TWICE A DAY 180 tablet 1  . Calcium Carbonate-Vitamin D (CALCIUM-VITAMIN D) 500-200 MG-UNIT per tablet Take 1 tablet by mouth 2 (two) times daily with a meal.      . cetirizine (ZYRTEC) 10 MG tablet Take 10 mg by mouth daily.      Marland Kitchen diltiazem (CARDIZEM) 30 MG tablet TAKE 1 TABLET (30 MG TOTAL) BY MOUTH 4 (FOUR) TIMES DAILY    . docusate sodium (COLACE) 100 MG capsule Take 100 mg by mouth 2 (two) times daily.    . hydrochlorothiazide (HYDRODIURIL) 50 MG tablet Take 50 mg by mouth daily.    Marland Kitchen KLOR-CON M20 20 MEQ tablet Take 20 mEq by mouth daily.    . metoprolol succinate (TOPROL-XL) 25 MG 24 hr tablet Take 25 mg by mouth daily.    . Multiple Vitamin (MULTIVITAMIN) tablet Take 1 tablet by mouth  daily.      . nitroGLYCERIN (NITROSTAT) 0.4 MG SL tablet PLACE 1 TABLET UNDER THE TONGUE EVERY 5 MINUTES AS NEEDED FOR CHEST PAIN MAY TAKE UP TO 3 DOSES.     No facility-administered medications prior to visit.     Review of Systems;  Patient denies headache, fevers, malaise, unintentional weight loss, skin rash, eye pain, sinus congestion and sinus pain, sore throat, dysphagia,  hemoptysis , cough, dyspnea, wheezing, chest pain, palpitations, orthopnea, edema, abdominal pain, nausea, melena, diarrhea, constipation, flank pain, dysuria, hematuria, urinary  Frequency, nocturia, numbness, tingling, seizures,  Focal weakness, Loss of consciousness,  Tremor, insomnia, depression, anxiety, and suicidal ideation.      Objective:  BP  112/77 (BP Location: Right Arm, Patient Position: Sitting, Cuff Size: Large)   Pulse 79   Temp (!) 97.1 F (36.2 C) (Temporal)   Resp 15   Ht 5\' 7"  (1.702 m)   Wt 222 lb (100.7 kg)   SpO2 97%   BMI 34.77 kg/m   BP Readings from Last 3 Encounters:  01/16/19 112/77  10/13/18 122/82  05/25/18 (!) 142/84    Wt Readings from Last 3 Encounters:  01/16/19 222 lb (100.7 kg)  10/13/18 224 lb 3.2 oz (101.7 kg)  05/25/18 222 lb 9.6 oz (101 kg)    General appearance: alert, cooperative and appears stated age Ears: normal TM's and external ear canals both ears Throat: lips, mucosa, and tongue normal; teeth and gums normal Neck: no adenopathy, no carotid bruit, supple, symmetrical, trachea midline and thyroid not enlarged, symmetric, no tenderness/mass/nodules Back: symmetric, no curvature. ROM normal. No CVA tenderness. Lungs: clear to auscultation bilaterally Heart: regular rate and rhythm, S1, S2 normal, no murmur, click, rub or gallop Abdomen: soft, non-tender; bowel sounds normal; no masses,  no organomegaly Pulses: 2+ and symmetric Skin: Skin color, texture, turgor normal. No rashes or lesions Lymph nodes: Cervical, supraclavicular, and axillary nodes normal.  Lab Results  Component Value Date   HGBA1C 5.8 11/24/2017   HGBA1C 5.7 12/30/2016   HGBA1C 5.9 01/11/2013    Lab Results  Component Value Date   CREATININE 0.93 01/24/2018   CREATININE 0.93 12/23/2017   CREATININE 0.69 11/24/2017    Lab Results  Component Value Date   WBC 5.4 05/19/2018   HGB 13.7 05/19/2018   HCT 38.6 05/19/2018   PLT 267 05/19/2018   GLUCOSE 152 (H) 01/24/2018   CHOL 197 11/06/2014   TRIG 138.0 11/06/2014   HDL 52.50 11/06/2014   LDLDIRECT 146.4 01/11/2013   LDLCALC 117 (H) 11/06/2014   ALT 16 12/23/2017   AST 17 12/23/2017   NA 134 (L) 01/24/2018   K 3.3 (L) 01/24/2018   CL 96 01/24/2018   CREATININE 0.93 01/24/2018   BUN 17 01/24/2018   CO2 27 01/24/2018   TSH 2.30 11/24/2017    INR 1.0 11/24/2017   HGBA1C 5.8 11/24/2017    Ct Chest Wo Contrast  Result Date: 09/26/2018 CLINICAL DATA:  Lung nodule. EXAM: CT CHEST WITHOUT CONTRAST TECHNIQUE: Multidetector CT imaging of the chest was performed following the standard protocol without IV contrast. COMPARISON:  CT scan of February 28, 2018. FINDINGS: Cardiovascular: Atherosclerosis of thoracic aorta is noted without aneurysm formation. Normal cardiac size. No pericardial effusion. Mediastinum/Nodes: No enlarged mediastinal or axillary lymph nodes. Thyroid gland, trachea, and esophagus demonstrate no significant findings. Lungs/Pleura: No pneumothorax or pleural effusion is noted. Stable probable radiation fibrosis seen in left upper lobe. Stable probable scarring seen medially in right lung base.  Right upper lobe nodule noted on prior exam is not visualized currently. No acute pulmonary abnormality is noted. Upper Abdomen: No acute abnormality. Musculoskeletal: No chest wall mass or suspicious bone lesions identified. IMPRESSION: Right upper lobe nodule noted on prior exam is not visualized currently. Stable probable radiation fibrosis seen anteriorly in left upper lobe. Stable probable right lower lobe scarring is noted. Aortic Atherosclerosis (ICD10-I70.0). Electronically Signed   By: Marijo Conception M.D.   On: 09/26/2018 16:35    Assessment & Plan:   Problem List Items Addressed This Visit      Unprioritized   Atrial fibrillation [facility])    S/p ablation in 2014, with recurrent episodes most recently in august.  Continue metoprolol and prn cardizem.      Relevant Medications   nitroGLYCERIN (NITROSTAT) 0.4 MG SL tablet   diltiazem (CARDIZEM) 30 MG tablet   Current mild episode of major depressive disorder without prior episode (HCC)   Pulmonary nodule, right    No growth by serial CTS      Bronchiectasis (HCC)    Likely secondary to fibrosis and traction given history of breast irradation for treatment of breast cancer.   Has had 2nd opinion from [facility] pulmonology ,  No indication for antibiotics. At this time.      Chest pain    Workup underway for cause.  Abnormal stress ECHO ; cardiac CT done today       Diuretic-induced hypokalemia    Taking hctz and potassium supplements added in late August for K of 3.0 .  Repeat today along with a mg level; if still elevated should have medication change.       Bladder cancer (HCC)   Relevant Medications   predniSONE (DELTASONE) 10 MG tablet    Other Visit Diagnoses    Hypokalemia    -  Primary   Relevant Orders   Magnesium   Basic metabolic panel   Tachycardia       Relevant Orders   TSH   CBC with Differential/Platelet     A total of 25 minutes of face to face time was spent with patient more than half of which was spent in counselling about the above mentioned conditions  and coordination of care    I am having Alba Destine start on predniSONE. I am also having her maintain her cetirizine, multivitamin, calcium-vitamin D, aspirin, metoprolol succinate, docusate sodium, hydrochlorothiazide, ALPRAZolam, buPROPion, Klor-Con M20, nitroGLYCERIN, and diltiazem.  Meds ordered this encounter  Medications  . predniSONE (DELTASONE) 10 MG tablet    Sig: 6 tablets   DAILY FOR 3 DAYS,   then reduce by 1 tablet daily until gone    Dispense:  33 tablet    Refill:  0    There are no discontinued medications.  Follow-up: No follow-ups on file.   Crecencio Mc, MD

## 2019-01-16 NOTE — Assessment & Plan Note (Signed)
No growth by serial CTS

## 2019-01-16 NOTE — Assessment & Plan Note (Signed)
S/p ablation in 2014, with recurrent episodes most recently in august.  Continue metoprolol and prn cardizem.

## 2019-01-16 NOTE — Patient Instructions (Addendum)
  [name] , Thank you for taking time to come for your Medicare Wellness Visit. I appreciate your ongoing commitment to your health goals. Please review the following plan we discussed and let me know if I can assist you in the future.   These are the goals we discussed: Goals      Patient Stated   . Weight (lb) < 200 lb (90.7 kg) (pt-stated)     I want to walk for exercise       This is a list of the screening recommended for you and due dates:  Health Maintenance  Topic Date Due  . Colon Cancer Screening  10/28/2022  . Tetanus Vaccine  12/28/2023  . Flu Shot  Completed  . DEXA scan (bone density measurement)  Completed  .  Hepatitis C: One time screening is recommended by Center for Disease Control  (CDC) for  adults born from [birth year] through 1963.   Completed  . Pneumonia vaccines  Completed  . Mammogram  Discontinued

## 2019-01-18 LAB — TSH: TSH: 1.38 u[IU]/mL (ref 0.35–4.50)

## 2019-01-19 LAB — CBC WITH DIFFERENTIAL/PLATELET
Basophils Absolute: 0.1 10*3/uL (ref 0.0–0.1)
Basophils Relative: 1.3 % (ref 0.0–3.0)
Eosinophils Absolute: 0.2 10*3/uL (ref 0.0–0.7)
Eosinophils Relative: 4.2 % (ref 0.0–5.0)
HCT: 43.3 % (ref 36.0–46.0)
Hemoglobin: 14.8 g/dL (ref 12.0–15.0)
Lymphocytes Relative: 30.1 % (ref 12.0–46.0)
Lymphs Abs: 1.7 10*3/uL (ref 0.7–4.0)
MCHC: 34.2 g/dL (ref 30.0–36.0)
MCV: 97.3 fl (ref 78.0–100.0)
Monocytes Absolute: 0.7 10*3/uL (ref 0.1–1.0)
Monocytes Relative: 11.6 % (ref 3.0–12.0)
Neutro Abs: 3.1 10*3/uL (ref 1.4–7.7)
Neutrophils Relative %: 52.8 % (ref 43.0–77.0)
Platelets: 219 10*3/uL (ref 150.0–400.0)
RBC: 4.45 Mil/uL (ref 3.87–5.11)
RDW: 13.2 % (ref 11.5–15.5)
WBC: 5.8 10*3/uL (ref 4.0–10.5)

## 2019-02-12 ENCOUNTER — Other Ambulatory Visit: Payer: Self-pay | Admitting: Internal Medicine

## 2019-02-12 NOTE — Telephone Encounter (Signed)
LM asking patient to call back to see if she requested this. It appears it ws refilled 3 weeks ago for 90 day supply.

## 2019-02-12 NOTE — Telephone Encounter (Signed)
Patient returned Sarahs call to inform her that she would like the "pill" form of potassium called in.  [id number] - [address], [place] [phone] (Phone) [phone] (Fax)

## 2019-02-28 ENCOUNTER — Other Ambulatory Visit: Payer: Self-pay | Admitting: Pulmonary Disease

## 2019-02-28 DIAGNOSIS — R911 Solitary pulmonary nodule: Secondary | ICD-10-CM

## 2019-03-29 DIAGNOSIS — R102 Pelvic and perineal pain: Secondary | ICD-10-CM | POA: Diagnosis not present

## 2019-03-29 DIAGNOSIS — M6281 Muscle weakness (generalized): Secondary | ICD-10-CM | POA: Diagnosis not present

## 2019-03-29 DIAGNOSIS — M629 Disorder of muscle, unspecified: Secondary | ICD-10-CM | POA: Diagnosis not present

## 2019-04-06 DIAGNOSIS — I972 Postmastectomy lymphedema syndrome: Secondary | ICD-10-CM | POA: Diagnosis not present

## 2019-04-12 ENCOUNTER — Telehealth: Payer: Self-pay | Admitting: Internal Medicine

## 2019-04-12 DIAGNOSIS — R102 Pelvic and perineal pain: Secondary | ICD-10-CM | POA: Diagnosis not present

## 2019-04-12 DIAGNOSIS — M6281 Muscle weakness (generalized): Secondary | ICD-10-CM | POA: Diagnosis not present

## 2019-04-12 DIAGNOSIS — M629 Disorder of muscle, unspecified: Secondary | ICD-10-CM | POA: Diagnosis not present

## 2019-04-12 NOTE — Telephone Encounter (Signed)
Pt dropped off a history and physical form to be filled out. Placed in Dr. Demetrios Isaacs color folder upfront

## 2019-04-15 NOTE — Telephone Encounter (Signed)
Please print out the following lists to attach to the form she has request I complete:  Surgical history Medical history/ problem list Medications

## 2019-04-20 ENCOUNTER — Other Ambulatory Visit: Payer: Self-pay

## 2019-04-20 ENCOUNTER — Ambulatory Visit
Admission: RE | Admit: 2019-04-20 | Discharge: 2019-04-20 | Disposition: A | Discharge status: Home / Self Care | Payer: Medicare PPO | Source: Ambulatory Visit | Attending: Pulmonary Disease | Admitting: Pulmonary Disease

## 2019-04-20 DIAGNOSIS — R918 Other nonspecific abnormal finding of lung field: Secondary | ICD-10-CM | POA: Diagnosis not present

## 2019-04-20 DIAGNOSIS — R911 Solitary pulmonary nodule: Secondary | ICD-10-CM | POA: Insufficient documentation

## 2019-04-20 NOTE — Telephone Encounter (Signed)
Advised patient PCP out of office due to death in family and paper work would be completed as soon as possible.

## 2019-04-20 NOTE — Telephone Encounter (Signed)
Pt is calling asking for an update on this. She has to turn it in on Monday 04/23/19. Please give patient a call.

## 2019-04-22 ENCOUNTER — Telehealth: Payer: Self-pay | Admitting: Internal Medicine

## 2019-04-22 NOTE — Telephone Encounter (Signed)
Heidi Walsh and her husband Heidi Walsh both need PPDS placed and read this week to complete the forms for their retirement community  living transition

## 2019-04-24 NOTE — Telephone Encounter (Signed)
Both have been scheduled for a nurse visit tomorrow.

## 2019-04-25 ENCOUNTER — Ambulatory Visit (INDEPENDENT_AMBULATORY_CARE_PROVIDER_SITE_OTHER): Payer: Medicare PPO

## 2019-04-25 ENCOUNTER — Ambulatory Visit: Payer: Medicare PPO

## 2019-04-25 ENCOUNTER — Other Ambulatory Visit: Payer: Self-pay

## 2019-04-25 DIAGNOSIS — L570 Actinic keratosis: Secondary | ICD-10-CM | POA: Diagnosis not present

## 2019-04-25 DIAGNOSIS — L578 Other skin changes due to chronic exposure to nonionizing radiation: Secondary | ICD-10-CM | POA: Diagnosis not present

## 2019-04-25 DIAGNOSIS — L821 Other seborrheic keratosis: Secondary | ICD-10-CM | POA: Diagnosis not present

## 2019-04-25 DIAGNOSIS — Z85828 Personal history of other malignant neoplasm of skin: Secondary | ICD-10-CM | POA: Diagnosis not present

## 2019-04-25 DIAGNOSIS — Z859 Personal history of malignant neoplasm, unspecified: Secondary | ICD-10-CM | POA: Diagnosis not present

## 2019-04-25 DIAGNOSIS — Z111 Encounter for screening for respiratory tuberculosis: Secondary | ICD-10-CM | POA: Diagnosis not present

## 2019-04-25 DIAGNOSIS — L918 Other hypertrophic disorders of the skin: Secondary | ICD-10-CM | POA: Diagnosis not present

## 2019-04-25 DIAGNOSIS — Z85820 Personal history of malignant melanoma of skin: Secondary | ICD-10-CM | POA: Diagnosis not present

## 2019-04-25 DIAGNOSIS — Z872 Personal history of diseases of the skin and subcutaneous tissue: Secondary | ICD-10-CM | POA: Diagnosis not present

## 2019-04-25 DIAGNOSIS — Z86018 Personal history of other benign neoplasm: Secondary | ICD-10-CM | POA: Diagnosis not present

## 2019-04-25 NOTE — Telephone Encounter (Signed)
Pt wanted to reschedule her appt appt has been rescheduled for later today.

## 2019-04-25 NOTE — Telephone Encounter (Signed)
Pt called and had a question about the nurse visit for today

## 2019-04-25 NOTE — Progress Notes (Signed)
Placed in R forearm, ID. Pt tolerated well. Pt will return on Friday after 2pm to have read.

## 2019-04-26 ENCOUNTER — Ambulatory Visit: Payer: Medicare PPO

## 2019-04-27 DIAGNOSIS — Z0279 Encounter for issue of other medical certificate: Secondary | ICD-10-CM

## 2019-05-01 DIAGNOSIS — G4733 Obstructive sleep apnea (adult) (pediatric): Secondary | ICD-10-CM | POA: Diagnosis not present

## 2019-05-03 DIAGNOSIS — M6281 Muscle weakness (generalized): Secondary | ICD-10-CM | POA: Diagnosis not present

## 2019-05-03 DIAGNOSIS — M629 Disorder of muscle, unspecified: Secondary | ICD-10-CM | POA: Diagnosis not present

## 2019-05-03 DIAGNOSIS — R102 Pelvic and perineal pain: Secondary | ICD-10-CM | POA: Diagnosis not present

## 2019-05-08 DIAGNOSIS — R911 Solitary pulmonary nodule: Secondary | ICD-10-CM | POA: Diagnosis not present

## 2019-05-21 DIAGNOSIS — Z01818 Encounter for other preprocedural examination: Secondary | ICD-10-CM | POA: Diagnosis not present

## 2019-05-23 DIAGNOSIS — R0602 Shortness of breath: Secondary | ICD-10-CM | POA: Diagnosis not present

## 2019-05-24 ENCOUNTER — Ambulatory Visit: Payer: Self-pay | Admitting: Psychology

## 2019-05-24 DIAGNOSIS — M629 Disorder of muscle, unspecified: Secondary | ICD-10-CM | POA: Diagnosis not present

## 2019-05-24 DIAGNOSIS — R102 Pelvic and perineal pain: Secondary | ICD-10-CM | POA: Diagnosis not present

## 2019-05-24 DIAGNOSIS — M6281 Muscle weakness (generalized): Secondary | ICD-10-CM | POA: Diagnosis not present

## 2019-05-29 DIAGNOSIS — G4733 Obstructive sleep apnea (adult) (pediatric): Secondary | ICD-10-CM | POA: Diagnosis not present

## 2019-05-30 ENCOUNTER — Other Ambulatory Visit: Payer: Self-pay | Admitting: Internal Medicine

## 2019-06-29 DIAGNOSIS — G4733 Obstructive sleep apnea (adult) (pediatric): Secondary | ICD-10-CM | POA: Diagnosis not present

## 2019-07-05 DIAGNOSIS — I972 Postmastectomy lymphedema syndrome: Secondary | ICD-10-CM | POA: Diagnosis not present

## 2019-07-29 DIAGNOSIS — G4733 Obstructive sleep apnea (adult) (pediatric): Secondary | ICD-10-CM | POA: Diagnosis not present

## 2019-08-17 ENCOUNTER — Other Ambulatory Visit: Payer: Self-pay | Admitting: Internal Medicine

## 2019-08-17 NOTE — Telephone Encounter (Signed)
Refill request for potassium, last seen 01-16-19, last filled 02-13-19.  Please advise.  Potassium last checked: 01-16-19 Potassium result: 3.4

## 2019-08-29 DIAGNOSIS — R0789 Other chest pain: Secondary | ICD-10-CM | POA: Diagnosis not present

## 2019-08-29 DIAGNOSIS — R918 Other nonspecific abnormal finding of lung field: Secondary | ICD-10-CM | POA: Diagnosis not present

## 2019-08-29 DIAGNOSIS — R0602 Shortness of breath: Secondary | ICD-10-CM | POA: Diagnosis not present

## 2019-08-29 DIAGNOSIS — I10 Essential (primary) hypertension: Secondary | ICD-10-CM | POA: Diagnosis not present

## 2019-08-29 DIAGNOSIS — I480 Paroxysmal atrial fibrillation: Secondary | ICD-10-CM | POA: Diagnosis not present

## 2019-08-29 DIAGNOSIS — G4733 Obstructive sleep apnea (adult) (pediatric): Secondary | ICD-10-CM | POA: Diagnosis not present

## 2019-09-03 DIAGNOSIS — H2513 Age-related nuclear cataract, bilateral: Secondary | ICD-10-CM | POA: Diagnosis not present

## 2019-09-03 DIAGNOSIS — H04123 Dry eye syndrome of bilateral lacrimal glands: Secondary | ICD-10-CM | POA: Diagnosis not present

## 2019-09-28 DIAGNOSIS — G4733 Obstructive sleep apnea (adult) (pediatric): Secondary | ICD-10-CM | POA: Diagnosis not present

## 2019-10-23 DIAGNOSIS — L570 Actinic keratosis: Secondary | ICD-10-CM | POA: Diagnosis not present

## 2019-10-23 DIAGNOSIS — Z85820 Personal history of malignant melanoma of skin: Secondary | ICD-10-CM | POA: Diagnosis not present

## 2019-10-23 DIAGNOSIS — L218 Other seborrheic dermatitis: Secondary | ICD-10-CM | POA: Diagnosis not present

## 2019-10-23 DIAGNOSIS — Z872 Personal history of diseases of the skin and subcutaneous tissue: Secondary | ICD-10-CM | POA: Diagnosis not present

## 2019-10-23 DIAGNOSIS — Z85828 Personal history of other malignant neoplasm of skin: Secondary | ICD-10-CM | POA: Diagnosis not present

## 2019-10-23 DIAGNOSIS — L578 Other skin changes due to chronic exposure to nonionizing radiation: Secondary | ICD-10-CM | POA: Diagnosis not present

## 2019-10-23 DIAGNOSIS — D2272 Melanocytic nevi of left lower limb, including hip: Secondary | ICD-10-CM | POA: Diagnosis not present

## 2019-10-23 DIAGNOSIS — L821 Other seborrheic keratosis: Secondary | ICD-10-CM | POA: Diagnosis not present

## 2019-10-23 DIAGNOSIS — Z86018 Personal history of other benign neoplasm: Secondary | ICD-10-CM | POA: Diagnosis not present

## 2019-10-23 DIAGNOSIS — Z859 Personal history of malignant neoplasm, unspecified: Secondary | ICD-10-CM | POA: Diagnosis not present

## 2019-10-29 DIAGNOSIS — G4733 Obstructive sleep apnea (adult) (pediatric): Secondary | ICD-10-CM | POA: Diagnosis not present

## 2019-11-13 DIAGNOSIS — D2272 Melanocytic nevi of left lower limb, including hip: Secondary | ICD-10-CM | POA: Diagnosis not present

## 2019-11-13 DIAGNOSIS — D2372 Other benign neoplasm of skin of left lower limb, including hip: Secondary | ICD-10-CM | POA: Diagnosis not present

## 2019-11-14 DIAGNOSIS — R918 Other nonspecific abnormal finding of lung field: Secondary | ICD-10-CM | POA: Diagnosis not present

## 2019-11-15 ENCOUNTER — Other Ambulatory Visit: Payer: Self-pay | Admitting: Internal Medicine

## 2019-11-29 DIAGNOSIS — G4733 Obstructive sleep apnea (adult) (pediatric): Secondary | ICD-10-CM | POA: Diagnosis not present

## 2019-12-29 DIAGNOSIS — G4733 Obstructive sleep apnea (adult) (pediatric): Secondary | ICD-10-CM | POA: Diagnosis not present

## 2020-01-11 ENCOUNTER — Encounter: Payer: Self-pay | Admitting: Internal Medicine

## 2020-01-11 ENCOUNTER — Other Ambulatory Visit: Payer: Self-pay

## 2020-01-11 ENCOUNTER — Ambulatory Visit: Payer: Medicare PPO | Admitting: Internal Medicine

## 2020-01-11 VITALS — BP 134/82 | HR 96 | Temp 99.0°F | Resp 16 | Ht 67.0 in | Wt 227.0 lb

## 2020-01-11 DIAGNOSIS — R918 Other nonspecific abnormal finding of lung field: Secondary | ICD-10-CM | POA: Diagnosis not present

## 2020-01-11 DIAGNOSIS — Z0001 Encounter for general adult medical examination with abnormal findings: Secondary | ICD-10-CM | POA: Diagnosis not present

## 2020-01-11 DIAGNOSIS — I700 Atherosclerosis of aorta: Secondary | ICD-10-CM

## 2020-01-11 DIAGNOSIS — R5383 Other fatigue: Secondary | ICD-10-CM | POA: Diagnosis not present

## 2020-01-11 DIAGNOSIS — T452X5A Adverse effect of vitamins, initial encounter: Secondary | ICD-10-CM

## 2020-01-11 DIAGNOSIS — E559 Vitamin D deficiency, unspecified: Secondary | ICD-10-CM

## 2020-01-11 DIAGNOSIS — R7303 Prediabetes: Secondary | ICD-10-CM | POA: Diagnosis not present

## 2020-01-11 DIAGNOSIS — Z853 Personal history of malignant neoplasm of breast: Secondary | ICD-10-CM

## 2020-01-11 DIAGNOSIS — Z0000 Encounter for general adult medical examination without abnormal findings: Secondary | ICD-10-CM

## 2020-01-11 DIAGNOSIS — Z8551 Personal history of malignant neoplasm of bladder: Secondary | ICD-10-CM | POA: Diagnosis not present

## 2020-01-11 DIAGNOSIS — K760 Fatty (change of) liver, not elsewhere classified: Secondary | ICD-10-CM

## 2020-01-11 DIAGNOSIS — E669 Obesity, unspecified: Secondary | ICD-10-CM

## 2020-01-11 NOTE — Progress Notes (Signed)
Patient ID: Heidi Walsh, female    DOB: 1946-01-02  Age: 74 y.o. MRN: [id number]  The patient is here for annual preventive  examination and management of other chronic and acute problems.   The risk factors are reflected in the social history.  There are no preventive care reminders to display for this patient.  The roster of all physicians providing medical care to patient - is listed in the Snapshot section of the chart.  Activities of daily living:  The patient is 100% independent in all ADLs: dressing, toileting, feeding as well as independent mobility  Home safety : The patient has smoke detectors in the home. They wear seatbelts.  There are no firearms at home. There is no violence in the home.   There is no risks for hepatitis, STDs or HIV. There is no   history of blood transfusion. They have no travel history to infectious disease endemic areas of the world.  The patient has seen their dentist in the last six month. They have seen their eye doctor in the last year. They admit to slight hearing difficulty with regard to whispered voices and some television programs.  They have deferred audiologic testing in the last year.  They do not  have excessive sun exposure. Discussed the need for sun protection: hats, long sleeves and use of sunscreen if there is significant sun exposure.   Diet: the importance of a healthy diet is discussed. They do have a healthy diet.  The benefits of regular aerobic exercise were discussed. She is walking daily  Using the Fit Bit and arm weights but frustrated at the weight loss . Afraid to go to the gym because of her bronchiectasis.   Depression screen: there are no signs or vegative symptoms of depression- irritability, change in appetite, anhedonia, sadness/tearfullness.  The following portions of the patient's history were reviewed and updated as appropriate: allergies, current medications, past family history, past medical history,  past surgical  history, past social history  and problem list.  Visual acuity was not assessed per patient preference since she has regular follow up with her ophthalmologist. Hearing and body mass index were assessed and reviewed.   During the course of the visit the patient was educated and counseled about appropriate screening and preventive services including : fall prevention , diabetes screening, nutrition counseling, colorectal cancer screening, and recommended immunizations.    CC: The primary encounter diagnosis was Adverse effect of vitamin D and vitamin D derivative. Diagnoses of Vitamin D deficiency, Hepatic steatosis, Fatigue, unspecified type, History of bladder cancer, Prediabetes, Obesity (BMI 30-39.9), Encounter for preventive health examination, Multiple pulmonary nodules determined by computed tomography of lung, History of breast cancer, and Thoracic aortic atherosclerosis ([place]) were also pertinent to this visit.   1) tripped over hose on leaf blower and fell .  Scraped up both knees and thinks she broke a rib   2) SAW PT for exercises to manage ischial tuberosity pain.   advised to avoid NSAIDS due to gastritis .  Using Sam E per the [place]  And pain is under control.   3) fatty liver diagnosis by CT scan.    4) Reviewed findings of prior CT scan today noting aortic atherosclerosis   History Patsye has a past medical history of Actinic keratosis, Atrial fibrillation ([place]) (2010), Bladder cancer ([place]), Bladder cancer ([place]) (11/18/2016), breast cancer (2010), breast cancer, Breast cyst (2003), Hypertension, Leukopenia, Mitral regurgitation, and Osteopenia (56).   She has a past  surgical history that includes Mastectomy (02/2009); Cesarean section; Shoulder surgery; Knee arthroscopy w/ meniscectomy (1997); Atrial ablation surgery (Dec 2012); Total knee arthroplasty (02/2014); Mastectomy; and Total hip arthroplasty (Right).   Her family history includes COPD in her mother; Hepatitis  in her sister; Hypertension in her father.She reports that she has never smoked. She has never used smokeless tobacco. She reports current alcohol use. She reports that she does not use drugs.  Outpatient Medications Prior to Visit  Medication Sig Dispense Refill   ALPRAZolam (XANAX) 0.25 MG tablet TAKE 1 TABLET BY MOUTH 2 TIMES DAILY AS NEEDED FOR ANXIETY 60 tablet 5   aspirin 81 MG tablet Take 81 mg by mouth 2 (two) times daily.     B Complex Vitamins (VITAMIN-B COMPLEX PO) Take 1 tablet by mouth daily.     buPROPion (WELLBUTRIN SR) 200 MG 12 hr tablet TAKE 1 TABLET BY MOUTH TWICE A DAY 180 tablet 1   Calcium Carbonate-Vit D-Min (CALCIUM 1200 PO) Take 1 capsule by mouth daily.     Calcium Carbonate-Vitamin D (CALCIUM-VITAMIN D) 500-200 MG-UNIT per tablet Take 1 tablet by mouth 2 (two) times daily with a meal.       cetirizine (ZYRTEC) 10 MG tablet Take 10 mg by mouth daily.       diltiazem (CARDIZEM) 30 MG tablet TAKE 1 TABLET (30 MG TOTAL) BY MOUTH 4 (FOUR) TIMES DAILY     docusate sodium (COLACE) 100 MG capsule Take 100 mg by mouth 2 (two) times daily.     HAWTHORNE PO Take 2 tablets by mouth daily.     hydrochlorothiazide (HYDRODIURIL) 50 MG tablet Take 50 mg by mouth daily.     KLOR-CON M20 20 MEQ tablet TAKE 1 TABLET BY MOUTH EVERY DAY 90 tablet 1   Melatonin 10 MG CAPS Take 1 capsule by mouth daily.     metoprolol succinate (TOPROL-XL) 25 MG 24 hr tablet Take 25 mg by mouth daily.     Milk Thistle 300 MG CAPS Take by mouth.     Misc Natural Products (HEALTHY LIVER PO) Take 2 tablets by mouth daily.     Multiple Vitamin (MULTIVITAMIN) tablet Take 1 tablet by mouth daily.       Multiple Vitamins-Minerals (HAIR SKIN AND NAILS FORMULA PO) Take 3 tablets by mouth daily.     nitroGLYCERIN (NITROSTAT) 0.4 MG SL tablet PLACE 1 TABLET UNDER THE TONGUE EVERY 5 MINUTES AS NEEDED FOR CHEST PAIN MAY TAKE UP TO 3 DOSES.     Probiotic Product (TRUNATURE DIGESTIVE PROBIOTIC) CAPS  Take by mouth.     S-Adenosylmethionine 200 MG TABS Take by mouth.     TURMERIC PO Take 2 capsules by mouth daily.     predniSONE (DELTASONE) 10 MG tablet 6 tablets   DAILY FOR 3 DAYS,   then reduce by 1 tablet daily until gone (Patient not taking: Reported on 01/11/2020) 33 tablet 0   No facility-administered medications prior to visit.    Review of Systems   Patient denies headache, fevers, malaise, unintentional weight loss, skin rash, eye pain, sinus congestion and sinus pain, sore throat, dysphagia,  hemoptysis , cough, dyspnea, wheezing, chest pain, palpitations, orthopnea, edema, abdominal pain, nausea, melena, diarrhea, constipation, flank pain, dysuria, hematuria, urinary  Frequency, nocturia, numbness, tingling, seizures,  Focal weakness, Loss of consciousness,  Tremor, insomnia, depression, anxiety, and suicidal ideation.      Objective:  BP 134/82 (BP Location: Right Arm, Patient Position: Sitting, Cuff Size: Large) Comment (BP Location):  requested bp in right arm against advise double mastectomy   Pulse 96    Temp 99 F (37.2 C) (Oral)    Resp 16    Ht 5\' 7"  (1.702 m)    Wt 227 lb (103 kg)    SpO2 97%    BMI 35.55 kg/m   Physical Exam  General appearance: alert, cooperative and appears stated age Ears: normal TM's and external ear canals both ears Throat: lips, mucosa, and tongue normal; teeth and gums normal Neck: no adenopathy, no carotid bruit, supple, symmetrical, trachea midline and thyroid not enlarged, symmetric, no tenderness/mass/nodules Back: symmetric, no curvature. ROM normal. No CVA tenderness. Chest wall: tender on right side to palpation without bruising/  Lungs: clear to auscultation bilaterally Heart: regular rate and rhythm, S1, S2 normal, no murmur, click, rub or gallop Abdomen: soft, non-tender; bowel sounds normal; no masses,  no organomegaly Pulses: 2+ and symmetric Skin: Skin color, texture, turgor normal. No rashes or lesions Lymph nodes:  Cervical, supraclavicular, and axillary nodes normal.  Assessment & Plan:   Problem List Items Addressed This Visit      Unprioritized   Encounter for preventive health examination    age appropriate education and counseling updated, referrals for preventative services and immunizations addressed, dietary and smoking counseling addressed, most recent labs reviewed.  I have personally reviewed and have noted:  1) the patient's medical and social history 2) The pt's use of alcohol, tobacco, and illicit drugs 3) The patient's current medications and supplements 4) Functional ability including ADL's, fall risk, home safety risk, hearing and visual impairment 5) Diet and physical activities 6) Evidence for depression or mood disorder 7) The patient's height, weight, and BMI have been recorded in the chart  I have made referrals, and provided counseling and education based on review of the above      Hepatic steatosis    Diagnosed by CT  Scan.  Referring to GI for management       Relevant Orders   Ambulatory referral to Gastroenterology   Comprehensive metabolic panel (Completed)   Hepatitis C antibody   Hepatitis A Ab, Total   Hepatitis B surface antibody,qualitative   Lipid panel (Completed)   History of breast cancer    Left breast,  S/p bilateral mastectomy In 2010 followed by use of Femara/ no recurrence/       Multiple pulmonary nodules determined by computed tomography of lung    By last CT Jan 2021, ordered as 6 month follow up by Pulmonology by Lanney Gins        Obesity (BMI 30-39.9)    Recommending [facility] for weight management.       Thoracic aortic atherosclerosis ([place])    Noted on recent Chest CT.  Reviewed findings of prior CT scan today..  Patient is advised to consider a trial of statin therapy starting with 20 mg crestor once daily and advance as tolerated to twice weekly.         Other Visit Diagnoses    Adverse effect of vitamin D and vitamin D  derivative    -  Primary   Vitamin D deficiency       Relevant Orders   VITAMIN D 25 Hydroxy (Vit-D Deficiency, Fractures) (Completed)   Fatigue, unspecified type       Relevant Orders   CBC with Differential/Platelet (Completed)   History of bladder cancer       Prediabetes       Relevant Orders   Hemoglobin  A1c (Completed)      I have discontinued Geneva E. Kister's predniSONE. I am also having her maintain her cetirizine, multivitamin, calcium-vitamin D, aspirin, metoprolol succinate, docusate sodium, hydrochlorothiazide, ALPRAZolam, nitroGLYCERIN, diltiazem, Klor-Con M20, buPROPion, HAWTHORNE PO, B Complex Vitamins (VITAMIN-B COMPLEX PO), Milk Thistle, Trunature Digestive Probiotic, S-Adenosylmethionine, Calcium Carbonate-Vit D-Min (CALCIUM 1200 PO), Multiple Vitamins-Minerals (HAIR SKIN AND NAILS FORMULA PO), Misc Natural Products (HEALTHY LIVER PO), Melatonin, and TURMERIC PO.  No orders of the defined types were placed in this encounter.   Medications Discontinued During This Encounter  Medication Reason   predniSONE (DELTASONE) 10 MG tablet     Follow-up: Return in about 4 weeks (around 02/08/2020).   Crecencio Mc, MD

## 2020-01-11 NOTE — Patient Instructions (Addendum)
Consider the Crystal for weight loss .Marland Kitchen It has been very successful for ALL OF the patients who have tried it    Referral to  GI for management of fatty liver:  No daily alcohol Avoid tylenol in excess of 1000 mg daily Continue to avoid NSAIDS Lab Results  Component Value Date   HGBA1C 5.8 37/16/9678      Nonalcoholic Fatty Liver Disease Diet, Adult Nonalcoholic fatty liver disease is a condition that causes fat to build up in and around the liver. The disease makes it harder for the liver to work the way that it should. Following a healthy diet can help to keep nonalcoholic fatty liver disease under control. It can also help to prevent or improve conditions that are associated with the disease, such as heart disease, diabetes, high blood pressure, and abnormal cholesterol levels. Along with regular exercise, this diet:  Promotes weight loss.  Helps to control blood sugar levels.  Helps to improve the way that the body uses insulin. What are tips for following this plan? Reading food labels Always check food labels for:  The amount of saturated fat in a food. You should limit your intake of saturated fat. Saturated fat is found in foods that come from animals, including meat and dairy products such as butter, cheese, and whole milk.  The amount of fiber in a food. You should choose high-fiber foods such as fruits, vegetables, and whole grains. Try to get 25-30 grams (g) of fiber a day.  Cooking  When cooking, use heart-healthy oils that are high in monounsaturated fats. These include olive oil, canola oil, and avocado oil.  Limit frying or deep-frying foods. Cook foods using healthy methods such as baking, boiling, steaming, and grilling instead. Meal planning  You may want to keep track of how many calories you take in. Eating the right amount of calories will help you achieve a healthy weight. Meeting with a registered dietitian can help you get started.  Limit  how often you eat takeout and fast food. These foods are usually very high in fat, salt, and sugar.  Use the glycemic index (GI) to plan your meals. The index tells you how quickly a food will raise your blood sugar. Choose low-GI foods (GI less than 55). These foods take a longer time to raise blood sugar. A registered dietitian can help you identify foods lower on the GI scale. Lifestyle  You may want to follow a Mediterranean diet. This diet includes a lot of vegetables, lean meats or fish, whole grains, fruits, and healthy oils and fats. What foods can I eat?  Fruits Bananas. Apples. Oranges. Grapes. Papaya. Mango. Pomegranate. Kiwi. Grapefruit. Cherries. Vegetables Lettuce. Spinach. Peas. Beets. Cauliflower. Cabbage. Broccoli. Carrots. Tomatoes. Squash. Eggplant. Herbs. Peppers. Onions. Cucumbers. Brussels sprouts. Yams and sweet potatoes. Beans. Lentils. Grains Whole wheat or whole-grain foods, including breads, crackers, cereals, and pasta. Stone-ground whole wheat. Unsweetened oatmeal. Bulgur. Barley. Quinoa. Brown or wild rice. Corn or whole wheat flour tortillas. Meats and other proteins Lean meats. Poultry. Tofu. Seafood and shellfish. Dairy Low-fat or fat-free dairy products, such as yogurt, cottage cheese, or cheese. Beverages Water. Sugar-free drinks. Tea. Coffee. Low-fat or skim milk. Milk alternatives, such as soy or almond milk. Real fruit juice. Fats and oils Avocado. Canola or olive oil. Nuts and nut butters. Seeds. Seasonings and condiments Mustard. Relish. Low-fat, low-sugar ketchup and barbecue sauce. Low-fat or fat-free mayonnaise. Sweets and desserts Sugar-free sweets. The items listed above may not be a  complete list of foods and beverages you can eat. Contact a dietitian for more information. What foods should I limit or avoid? Meats and other proteins Limit red meat to 1-2 times a week. Dairy [employer]. Fats and oils Palm oil and coconut oil. Fried  foods. Other foods Processed foods. Foods that contain a lot of salt or sodium. Sweets and desserts Sweets that contain sugar. Beverages Sweetened drinks, such as sweet tea, milkshakes, iced sweet drinks, and sodas. Alcohol. The items listed above may not be a complete list of foods and beverages you should avoid. Contact a dietitian for more information. Where to find more information The [employer] of Diabetes and Digestive and Kidney Diseases: [email or web address] Summary  Nonalcoholic fatty liver disease is a condition that causes fat to build up in and around the liver.  Following a healthy diet can help to keep nonalcoholic fatty liver disease under control. Your diet should be rich in fruits, vegetables, whole grains, and lean proteins.  Limit your intake of saturated fat. Saturated fat is found in foods that come from animals, including meat and dairy products such as butter, cheese, and whole milk.  This diet promotes weight loss, helps to control blood sugar levels, and helps to improve the way that the body uses insulin. This information is not intended to replace advice given to you by your health care provider. Make sure you discuss any questions you have with your health care provider. Document Revised: 06/30/2018 Document Reviewed: 03/30/2018 Elsevier Patient Education  [place].

## 2020-01-13 ENCOUNTER — Other Ambulatory Visit: Payer: Self-pay | Admitting: Internal Medicine

## 2020-01-13 ENCOUNTER — Encounter: Payer: Self-pay | Admitting: Internal Medicine

## 2020-01-13 DIAGNOSIS — I700 Atherosclerosis of aorta: Secondary | ICD-10-CM | POA: Insufficient documentation

## 2020-01-13 DIAGNOSIS — Z0000 Encounter for general adult medical examination without abnormal findings: Secondary | ICD-10-CM | POA: Insufficient documentation

## 2020-01-13 DIAGNOSIS — E876 Hypokalemia: Secondary | ICD-10-CM

## 2020-01-13 MED ORDER — POTASSIUM CHLORIDE CRYS ER 20 MEQ PO TBCR
20.0000 meq | EXTENDED_RELEASE_TABLET | Freq: Every day | ORAL | 1 refills | Status: DC
Start: 2020-01-13 — End: 2020-01-16

## 2020-01-13 NOTE — Assessment & Plan Note (Signed)
Recommending Optavia diet for weight management.

## 2020-01-13 NOTE — Progress Notes (Signed)
Attached are your recent fasting labs.  Your cholesterol levels are normal; however given the findings of aortic atherosclerosis on your January CT scan and your concurrent diagnosis of fatty liver,  I want to  strongly recommend a trial of rosuvastatin, as this is highly recommended to prevent progression of fatty liver and prevent strokes from ruptured placque .   Please let me know  if you are willing to start this medication.   Your HCTZ is still dropping your potassium level and you will need to start a daily potassium supplement,  which I have sent to your pharmacy.  Regards,   Deborra Medina, MD

## 2020-01-13 NOTE — Assessment & Plan Note (Addendum)
Left breast,  S/p bilateral mastectomy In 2010 followed by use of Femara/ no recurrence/

## 2020-01-13 NOTE — Assessment & Plan Note (Signed)
Diagnosed by CT  Scan.  Referring to GI for management

## 2020-01-13 NOTE — Assessment & Plan Note (Addendum)
Noted on recent Chest CT.  Reviewed findings of prior CT scan today..  Patient is advised to consider a trial of statin therapy starting with 20 mg crestor once daily and advance as tolerated to twice weekly.

## 2020-01-13 NOTE — Assessment & Plan Note (Addendum)
By last CT Jan 2021, ordered as 6 month follow up by Pulmonology by Lanney Gins

## 2020-01-13 NOTE — Assessment & Plan Note (Signed)

## 2020-01-14 LAB — HEPATITIS A ANTIBODY, TOTAL: Hepatitis A AB,Total: NONREACTIVE

## 2020-01-14 LAB — CBC WITH DIFFERENTIAL/PLATELET
Absolute Monocytes: 621 cells/uL (ref 200–950)
Basophils Absolute: 63 cells/uL (ref 0–200)
Basophils Relative: 1.1 %
Eosinophils Absolute: 211 cells/uL (ref 15–500)
Eosinophils Relative: 3.7 %
HCT: 39.2 % (ref 35.0–45.0)
Hemoglobin: 13.5 g/dL (ref 11.7–15.5)
Lymphs Abs: 1727 cells/uL (ref 850–3900)
MCH: 33.3 pg — ABNORMAL HIGH (ref 27.0–33.0)
MCHC: 34.4 g/dL (ref 32.0–36.0)
MCV: 96.8 fL (ref 80.0–100.0)
MPV: 11.2 fL (ref 7.5–12.5)
Monocytes Relative: 10.9 %
Neutro Abs: 3078 cells/uL (ref 1500–7800)
Neutrophils Relative %: 54 %
Platelets: 243 10*3/uL (ref 140–400)
RBC: 4.05 10*6/uL (ref 3.80–5.10)
RDW: 12.5 % (ref 11.0–15.0)
Total Lymphocyte: 30.3 %
WBC: 5.7 10*3/uL (ref 3.8–10.8)

## 2020-01-14 LAB — HEMOGLOBIN A1C
Hgb A1c MFr Bld: 5.8 % of total Hgb — ABNORMAL HIGH (ref <5.7)
Mean Plasma Glucose: 120 (calc)
eAG (mmol/L): 6.6 (calc)

## 2020-01-14 LAB — HEPATITIS C ANTIBODY
Hepatitis C Ab: NONREACTIVE
SIGNAL TO CUT-OFF: 0.01 (ref <1.00)

## 2020-01-14 LAB — LIPID PANEL
Cholesterol: 185 mg/dL (ref <200)
HDL: 57 mg/dL (ref >=50)
LDL Cholesterol (Calc): 104 mg/dL (calc) — ABNORMAL HIGH
Non-HDL Cholesterol (Calc): 128 mg/dL (calc) (ref <130)
Total CHOL/HDL Ratio: 3.2 (calc) (ref <5.0)
Triglycerides: 140 mg/dL (ref <150)

## 2020-01-14 LAB — COMPREHENSIVE METABOLIC PANEL
AG Ratio: 1.6 (calc) (ref 1.0–2.5)
ALT: 22 U/L (ref 6–29)
AST: 21 U/L (ref 10–35)
Albumin: 4.6 g/dL (ref 3.6–5.1)
Alkaline phosphatase (APISO): 67 U/L (ref 37–153)
BUN: 25 mg/dL (ref 7–25)
CO2: 23 mmol/L (ref 20–32)
Calcium: 9.5 mg/dL (ref 8.6–10.4)
Chloride: 102 mmol/L (ref 98–110)
Creat: 0.77 mg/dL (ref 0.60–0.93)
Globulin: 2.9 g/dL (calc) (ref 1.9–3.7)
Glucose, Bld: 93 mg/dL (ref 65–99)
Potassium: 3.4 mmol/L — ABNORMAL LOW (ref 3.5–5.3)
Sodium: 138 mmol/L (ref 135–146)
Total Bilirubin: 0.5 mg/dL (ref 0.2–1.2)
Total Protein: 7.5 g/dL (ref 6.1–8.1)

## 2020-01-14 LAB — VITAMIN D 25 HYDROXY (VIT D DEFICIENCY, FRACTURES): Vit D, 25-Hydroxy: 42 ng/mL (ref 30–100)

## 2020-01-14 LAB — HEPATITIS B SURFACE ANTIBODY,QUALITATIVE: Hep B S Ab: REACTIVE — AB

## 2020-01-16 ENCOUNTER — Other Ambulatory Visit: Payer: Self-pay | Admitting: Internal Medicine

## 2020-01-16 DIAGNOSIS — K760 Fatty (change of) liver, not elsewhere classified: Secondary | ICD-10-CM

## 2020-01-16 MED ORDER — POTASSIUM CHLORIDE CRYS ER 20 MEQ PO TBCR
20.0000 meq | EXTENDED_RELEASE_TABLET | Freq: Two times a day (BID) | ORAL | 1 refills | Status: DC
Start: 2020-01-16 — End: 2020-03-31

## 2020-01-16 MED ORDER — ROSUVASTATIN CALCIUM 10 MG PO TABS: 10.0000 mg | ORAL_TABLET | Freq: Every day | ORAL | 3 refills | Status: DC

## 2020-01-17 ENCOUNTER — Ambulatory Visit (INDEPENDENT_AMBULATORY_CARE_PROVIDER_SITE_OTHER): Payer: Medicare PPO

## 2020-01-17 ENCOUNTER — Other Ambulatory Visit: Payer: Self-pay | Admitting: Internal Medicine

## 2020-01-17 ENCOUNTER — Telehealth: Payer: Self-pay

## 2020-01-17 VITALS — Ht 67.0 in | Wt 227.0 lb

## 2020-01-17 DIAGNOSIS — Z0000 Encounter for general adult medical examination without abnormal findings: Secondary | ICD-10-CM | POA: Diagnosis not present

## 2020-01-17 MED ORDER — ALPRAZOLAM 0.25 MG PO TABS
ORAL_TABLET | ORAL | 0 refills | Status: DC
Start: 2020-01-17 — End: 2020-04-11

## 2020-01-17 NOTE — Patient Instructions (Addendum)
Heidi Walsh , Thank you for taking time to come for your Medicare Wellness Visit. I appreciate your ongoing commitment to your health goals. Please review the following plan we discussed and let me know if I can assist you in the future.   These are the goals we discussed: Goals      Patient Stated   .  Weight (lb) < 200 lb (90.7 kg) (pt-stated)      I want to walk for exercise Use stationary bike Exercise as tolerated       This is a list of the screening recommended for you and due dates:  Health Maintenance  Topic Date Due  . Flu Shot  06/19/2020*  . Colon Cancer Screening  10/28/2022  . Tetanus Vaccine  12/28/2023  . DEXA scan (bone density measurement)  Completed  . COVID-19 Vaccine  Completed  .  Hepatitis C: One time screening is recommended by Center for Disease Control  (CDC) for  adults born from [birth year] through 1963.   Completed  . Pneumonia vaccines  Completed  . Mammogram  Discontinued  *Topic was postponed. The date shown is not the original due date.    Immunizations Immunization History  Administered Date(s) Administered  . Fluad Quad(high Dose 65+) 01/15/2019  . Influenza Split 02/23/2011, 02/20/2012  . Influenza Whole 02/04/2016  . Influenza, High Dose Seasonal PF 01/01/2015  . Influenza,inj,Quad PF,6+ Mos 12/25/2012  . Influenza,inj,quad, With Preservative 12/20/2016, 12/20/2017  . Influenza-Unspecified 12/16/2016, 01/03/2018  . Moderna SARS-COVID-2 Vaccination 05/02/2019, 05/30/2019  . PPD Test 04/25/2019  . Pneumococcal Conjugate-13 09/20/2013  . Pneumococcal Polysaccharide-23 03/19/2014  . Tdap 11/24/2006, 12/27/2013  . Zoster 12/26/2007   Keep all routine maintenance appointments.   Follow up 02/12/20 @ 2:00  Advanced directives: End of life planning; Advance aging; Advanced directives discussed.  Copy of current HCPOA/Living Will requested.    Conditions/risks identified: none new.   Follow up in one year for your annual wellness visit.     Preventive Care 86 Years and Older, Female Preventive care refers to lifestyle choices and visits with your health care provider that can promote health and wellness. What does preventive care include?  A yearly physical exam. This is also called an annual well check.  Dental exams once or twice a year.  Routine eye exams. Ask your health care provider how often you should have your eyes checked.  Personal lifestyle choices, including:  Daily care of your teeth and gums.  Regular physical activity.  Eating a healthy diet.  Avoiding tobacco and drug use.  Limiting alcohol use.  Practicing safe sex.  Taking low-dose aspirin every day.  Taking vitamin and mineral supplements as recommended by your health care provider. What happens during an annual well check? The services and screenings done by your health care provider during your annual well check will depend on your age, overall health, lifestyle risk factors, and family history of disease. Counseling  Your health care provider may ask you questions about your:  Alcohol use.  Tobacco use.  Drug use.  Emotional well-being.  Home and relationship well-being.  Sexual activity.  Eating habits.  History of falls.  Memory and ability to understand (cognition).  Work and work Statistician.  Reproductive health. Screening  You may have the following tests or measurements:  Height, weight, and BMI.  Blood pressure.  Lipid and cholesterol levels. These may be checked every 5 years, or more frequently if you are over 63 years old.  Skin check.  Lung cancer screening. You may have this screening every year starting at age 61 if you have a 30-pack-year history of smoking and currently smoke or have quit within the past 15 years.  Fecal occult blood test (FOBT) of the stool. You may have this test every year starting at age 65.  Flexible sigmoidoscopy or colonoscopy. You may have a sigmoidoscopy every 5  years or a colonoscopy every 10 years starting at age 29.  Hepatitis C blood test.  Hepatitis B blood test.  Sexually transmitted disease (STD) testing.  Diabetes screening. This is done by checking your blood sugar (glucose) after you have not eaten for a while (fasting). You may have this done every 1-3 years.  Bone density scan. This is done to screen for osteoporosis. You may have this done starting at age 39.  Mammogram. This may be done every 1-2 years. Talk to your health care provider about how often you should have regular mammograms. Talk with your health care provider about your test results, treatment options, and if necessary, the need for more tests. Vaccines  Your health care provider may recommend certain vaccines, such as:  Influenza vaccine. This is recommended every year.  Tetanus, diphtheria, and acellular pertussis (Tdap, Td) vaccine. You may need a Td booster every 10 years.  Zoster vaccine. You may need this after age 45.  Pneumococcal 13-valent conjugate (PCV13) vaccine. One dose is recommended after age 83.  Pneumococcal polysaccharide (PPSV23) vaccine. One dose is recommended after age 19. Talk to your health care provider about which screenings and vaccines you need and how often you need them. This information is not intended to replace advice given to you by your health care provider. Make sure you discuss any questions you have with your health care provider. Document Released: 04/04/2015 Document Revised: 11/26/2015 Document Reviewed: 01/07/2015 Elsevier Interactive Patient Education  2017 [place] Prevention in the Home Falls can cause injuries. They can happen to people of all ages. There are many things you can do to make your home safe and to help prevent falls. What can I do on the outside of my home?  Regularly fix the edges of walkways and driveways and fix any cracks.  Remove anything that might make you trip as you walk through  a door, such as a raised step or threshold.  Trim any bushes or trees on the path to your home.  Use bright outdoor lighting.  Clear any walking paths of anything that might make someone trip, such as rocks or tools.  Regularly check to see if handrails are loose or broken. Make sure that both sides of any steps have handrails.  Any raised decks and porches should have guardrails on the edges.  Have any leaves, snow, or ice cleared regularly.  Use sand or salt on walking paths during winter.  Clean up any spills in your garage right away. This includes oil or grease spills. What can I do in the bathroom?  Use night lights.  Install grab bars by the toilet and in the tub and shower. Do not use towel bars as grab bars.  Use non-skid mats or decals in the tub or shower.  If you need to sit down in the shower, use a plastic, non-slip stool.  Keep the floor dry. Clean up any water that spills on the floor as soon as it happens.  Remove soap buildup in the tub or shower regularly.  Attach bath mats securely with double-sided non-slip  rug tape.  Do not have throw rugs and other things on the floor that can make you trip. What can I do in the bedroom?  Use night lights.  Make sure that you have a light by your bed that is easy to reach.  Do not use any sheets or blankets that are too big for your bed. They should not hang down onto the floor.  Have a firm chair that has side arms. You can use this for support while you get dressed.  Do not have throw rugs and other things on the floor that can make you trip. What can I do in the kitchen?  Clean up any spills right away.  Avoid walking on wet floors.  Keep items that you use a lot in easy-to-reach places.  If you need to reach something above you, use a strong step stool that has a grab bar.  Keep electrical cords out of the way.  Do not use floor polish or wax that makes floors slippery. If you must use wax, use  non-skid floor wax.  Do not have throw rugs and other things on the floor that can make you trip. What can I do with my stairs?  Do not leave any items on the stairs.  Make sure that there are handrails on both sides of the stairs and use them. Fix handrails that are broken or loose. Make sure that handrails are as long as the stairways.  Check any carpeting to make sure that it is firmly attached to the stairs. Fix any carpet that is loose or worn.  Avoid having throw rugs at the top or bottom of the stairs. If you do have throw rugs, attach them to the floor with carpet tape.  Make sure that you have a light switch at the top of the stairs and the bottom of the stairs. If you do not have them, ask someone to add them for you. What else can I do to help prevent falls?  Wear shoes that:  Do not have high heels.  Have rubber bottoms.  Are comfortable and fit you well.  Are closed at the toe. Do not wear sandals.  If you use a stepladder:  Make sure that it is fully opened. Do not climb a closed stepladder.  Make sure that both sides of the stepladder are locked into place.  Ask someone to hold it for you, if possible.  Clearly mark and make sure that you can see:  Any grab bars or handrails.  First and last steps.  Where the edge of each step is.  Use tools that help you move around (mobility aids) if they are needed. These include:  Canes.  Walkers.  Scooters.  Crutches.  Turn on the lights when you go into a dark area. Replace any light bulbs as soon as they burn out.  Set up your furniture so you have a clear path. Avoid moving your furniture around.  If any of your floors are uneven, fix them.  If there are any pets around you, be aware of where they are.  Review your medicines with your doctor. Some medicines can make you feel dizzy. This can increase your chance of falling. Ask your doctor what other things that you can do to help prevent falls. This  information is not intended to replace advice given to you by your health care provider. Make sure you discuss any questions you have with your health care provider. Document Released:  01/02/2009 Document Revised: 08/14/2015 Document Reviewed: 04/12/2014 Elsevier Interactive Patient Education  2017 [employer].

## 2020-01-17 NOTE — Progress Notes (Addendum)
Subjective:   Heidi Walsh is a 74 y.o. female who presents for Medicare Annual (Subsequent) preventive examination.  Review of Systems    No ROS.  Medicare Wellness Virtual Visit.    Cardiac Risk Factors include: advanced age (>14men, >75 women)     Objective:    Today's Vitals   01/17/20 1236  Weight: 227 lb (103 kg)  Height: 5\' 7"  (1.702 m)   Body mass index is 35.55 kg/m.  Advanced Directives 01/17/2020 01/16/2019 12/22/2017 12/21/2016 08/20/2015  Does Patient Have a Medical Advance Directive? Yes Yes Yes Yes Yes  Type of Paramedic of [place];Living will [place];Living will [place];Living will [place];Living will [place];Living will  Does patient want to make changes to medical advance directive? No - Patient declined No - Patient declined No - Patient declined No - Patient declined No - Patient declined  Copy of [place] in Chart? No - copy requested No - copy requested No - copy requested No - copy requested -    Current Medications (verified) Outpatient Encounter Medications as of 01/17/2020  Medication Sig   ALPRAZolam (XANAX) 0.25 MG tablet TAKE 1 TABLET BY MOUTH 2 TIMES DAILY AS NEEDED FOR ANXIETY   aspirin 81 MG tablet Take 81 mg by mouth 2 (two) times daily.   B Complex Vitamins (VITAMIN-B COMPLEX PO) Take 1 tablet by mouth daily.   buPROPion (WELLBUTRIN SR) 200 MG 12 hr tablet TAKE 1 TABLET BY MOUTH TWICE A DAY   Calcium Carbonate-Vit D-Min (CALCIUM 1200 PO) Take 1 capsule by mouth daily.   Calcium Carbonate-Vitamin D (CALCIUM-VITAMIN D) 500-200 MG-UNIT per tablet Take 1 tablet by mouth 2 (two) times daily with a meal.     cetirizine (ZYRTEC) 10 MG tablet Take 10 mg by mouth daily.     diltiazem (CARDIZEM) 30 MG tablet TAKE 1 TABLET (30 MG TOTAL) BY MOUTH 4 (FOUR) TIMES DAILY   docusate sodium (COLACE) 100 MG capsule Take 100 mg by  mouth 2 (two) times daily.   HAWTHORNE PO Take 2 tablets by mouth daily.   hydrochlorothiazide (HYDRODIURIL) 50 MG tablet Take 50 mg by mouth daily.   Melatonin 10 MG CAPS Take 1 capsule by mouth daily.   metoprolol succinate (TOPROL-XL) 25 MG 24 hr tablet Take 25 mg by mouth daily.   Milk Thistle 300 MG CAPS Take by mouth.   Misc Natural Products (HEALTHY LIVER PO) Take 2 tablets by mouth daily.   Multiple Vitamin (MULTIVITAMIN) tablet Take 1 tablet by mouth daily.     Multiple Vitamins-Minerals (HAIR SKIN AND NAILS FORMULA PO) Take 3 tablets by mouth daily.   nitroGLYCERIN (NITROSTAT) 0.4 MG SL tablet PLACE 1 TABLET UNDER THE TONGUE EVERY 5 MINUTES AS NEEDED FOR CHEST PAIN MAY TAKE UP TO 3 DOSES.   potassium chloride SA (KLOR-CON M20) 20 MEQ tablet Take 1 tablet (20 mEq total) by mouth 2 (two) times daily.   Probiotic Product (TRUNATURE DIGESTIVE PROBIOTIC) CAPS Take by mouth.   rosuvastatin (CRESTOR) 10 MG tablet Take 1 tablet (10 mg total) by mouth daily.   S-Adenosylmethionine 200 MG TABS Take by mouth.   TURMERIC PO Take 2 capsules by mouth daily.   No facility-administered encounter medications on file as of 01/17/2020.    Allergies (verified) Oxycodone, Penicillins, Ancef [cefazolin], Cephalexin, and Rivaroxaban   History: Past Medical History:  Diagnosis Date   Actinic keratosis  managed by Darlis Loan   Atrial fibrillation [facility]) 2010   Followed by Dr. Jordan Hawks   Bladder cancer [facility])    Bladder cancer [facility]) 11/18/2016    1 cm papillar tumor covering   left UO Resected at [facility] Sept 6 by Stephens Shire and ureteral stent placed.  Stent removed sept 20.  Repeat cystoscopy 3 months [facility]     breast cancer 2010   s/p mastectomy   breast cancer    s/p mastectomy    Breast cyst 2003   aspirated, fibroystics breasts with stable calcifications on left breast   Hypertension    Leukopenia    Mitral regurgitation    and prolapse   Osteopenia 56   Stable by repeat  scans   Past Surgical History:  Procedure Laterality Date   ATRIAL ABLATION SURGERY  Dec 2012   [facility]   CESAREAN SECTION     x 2   KNEE ARTHROSCOPY W/ MENISCECTOMY  1997   MASTECTOMY  02/2009   Bilateral   MASTECTOMY     Right and Left   SHOULDER SURGERY     left rotator cuff   TOTAL HIP ARTHROPLASTY Right    TOTAL KNEE ARTHROPLASTY  02/2014   Left knee   Family History  Problem Relation Age of Onset   COPD Mother    Hypertension Father    Hepatitis Sister        Hepatitis C from a dental procedure   Social History   Socioeconomic History   Marital status: Married    Spouse name: Not on file   Number of children: Not on file   Years of education: Not on file   Highest education level: Not on file  Occupational History   Not on file  Tobacco Use   Smoking status: Never Smoker   Smokeless tobacco: Never Used  Vaping Use   Vaping Use: Never used  Substance and Sexual Activity   Alcohol use: Yes    Alcohol/week: 0.0 standard drinks    Comment: occ   Drug use: No   Sexual activity: Not on file  Other Topics Concern   Not on file  Social History Narrative   Not on file   Social Determinants of Health   Financial Resource Strain: Low Risk    Difficulty of Paying Living Expenses: Not hard at all  Food Insecurity: No Food Insecurity   Worried About Charity fundraiser in the Last Year: Never true   [place] in the Last Year: Never true  Transportation Needs: No Transportation Needs   Lack of Transportation (Medical): No   Lack of Transportation (Non-Medical): No  Physical Activity:    Days of Exercise per Week: Not on file   Minutes of Exercise per Session: Not on file  Stress: No Stress Concern Present   Feeling of Stress : Not at all  Social Connections: Unknown   Frequency of Communication with Friends and Family: Not on file   Frequency of Social Gatherings with Friends and Family: Not on file   Attends Religious Services: Not on file   Active  Member of Clubs or Organizations: Not on file   Attends Archivist Meetings: Not on file   Marital Status: Married    Tobacco Counseling Counseling given: Not Answered   Clinical Intake:  Pre-visit preparation completed: Yes        Diabetes: No  How often do you need to have someone help you when  you read instructions, pamphlets, or other written materials from your doctor or pharmacy?: 1 - Never  Interpreter Needed?: No      Activities of Daily Living In your present state of health, do you have any difficulty performing the following activities: 01/17/2020  Hearing? N  Vision? N  Difficulty concentrating or making decisions? N  Walking or climbing stairs? Y  Comment Knee pain due to recent falls  Dressing or bathing? N  Doing errands, shopping? N  Preparing Food and eating ? N  Using the Toilet? N  In the past six months, have you accidently leaked urine? Y  Comment Managed with daily liner  Do you have problems with loss of bowel control? N  Managing your Medications? N  Managing your Finances? N  Housekeeping or managing your Housekeeping? N  Some recent data might be hidden    Patient Care Team: Crecencio Mc, MD as PCP - General (Internal Medicine)  Indicate any recent Medical Services you may have received from other than [facility] providers in the past year (date may be approximate).     Assessment:   This is a routine wellness examination for Lura.  I connected with Legacie today by telephone and verified that I am speaking with the correct person using two identifiers. Location patient: home Location provider: work Persons participating in the virtual visit: patient, Marine scientist.    I discussed the limitations, risks, security and privacy concerns of performing an evaluation and management service by telephone and the availability of in person appointments. The patient expressed understanding and verbally consented to this telephonic visit.      Interactive audio and video telecommunications were attempted between this provider and patient, however failed, due to patient having technical difficulties OR patient did not have access to video capability.  We continued and completed visit with audio only.  Some vital signs may be absent or patient reported.   Hearing/Vision screen  Hearing Screening   125Hz  250Hz  500Hz  1000Hz  2000Hz  3000Hz  4000Hz  6000Hz  8000Hz   Right ear:           Left ear:           Comments: Patient is able to hear conversational tones without difficulty. No issues reported.   Vision Screening Comments: Followed by [facility]  Wears corrective lenses  Virtual visit  Dietary issues and exercise activities discussed: Current Exercise Habits: The patient does not participate in regular exercise at present Low cholesterol diet Good water intake   Goals       Patient Stated     Weight (lb) < 200 lb (90.7 kg) (pt-stated)      I want to walk for exercise Use stationary bike Exercise as tolerated       Depression Screen PHQ 2/9 Scores 01/17/2020 01/16/2019 12/22/2017 12/21/2016 01/05/2016 08/03/2015 10/28/2014  PHQ - 2 Score 0 0 0 1 0 2 0  PHQ- 9 Score - - - 2 - 5 -    Fall Risk Fall Risk  01/17/2020 01/11/2020 01/16/2019 01/16/2019 12/22/2017  Falls in the past year? (No Data) 1 0 0 Yes  Comment None since last reported 1 week ago - - - -  Number falls in past yr: - 0 - 0 1  Injury with Fall? - 1 - - No  Comment - - - - Uneven ground when walking outdoors. No injury.   Follow up Falls evaluation completed Falls evaluation completed Falls evaluation completed - -   Handrails in use when  climbing stairs? Yes Home free of loose throw rugs in walkways, pet beds, electrical cords, etc? Yes  Adequate lighting in your home to reduce risk of falls? Yes   ASSISTIVE DEVICES UTILIZED TO PREVENT FALLS: Use of a cane, walker or w/c? No   TIMED UP AND GO: Was the test performed? No . Virtual visit.     Cognitive Function: Patient is alert and oriented x3.  Denies difficulty focusing, making decisions, memory loss.  Enjoys reading, learning to play piano, painting, and crossword puzzles for brain health.  MMSE/6CIT deferred. Normal by direct communication/observation.  MMSE - Mini Mental State Exam 12/22/2017 12/21/2016  Orientation to time 5 5  Orientation to Place 5 5  Registration 3 3  Attention/ Calculation 5 5  Recall 3 3  Language- name 2 objects 2 2  Language- repeat 1 1  Language- follow 3 step command 3 3  Language- read & follow direction 1 1  Write a sentence 1 1  Copy design 1 1  Total score 30 30     6CIT Screen 01/16/2019  What Year? 0 points  What month? 0 points  What time? 0 points  Count back from 20 0 points  Months in reverse 0 points  Repeat phrase 0 points  Total Score 0    Immunizations Immunization History  Administered Date(s) Administered   Fluad Quad(high Dose 65+) 01/15/2019   Influenza Split 02/23/2011, 02/20/2012   Influenza Whole 02/04/2016   Influenza, High Dose Seasonal PF 01/01/2015   Influenza,inj,Quad PF,6+ Mos 12/25/2012   Influenza,inj,quad, With Preservative 12/20/2016, 12/20/2017   Influenza-Unspecified 12/16/2016, 01/03/2018   Moderna SARS-COVID-2 Vaccination 05/02/2019, 05/30/2019   PPD Test 04/25/2019   Pneumococcal Conjugate-13 09/20/2013   Pneumococcal Polysaccharide-23 03/19/2014   Tdap 11/24/2006, 12/27/2013   Zoster 12/26/2007   Health Maintenance There are no preventive care reminders to display for this patient. Health Maintenance  Topic Date Due   INFLUENZA VACCINE  06/19/2020 (Originally 10/21/2019)   COLONOSCOPY  10/28/2022   TETANUS/TDAP  12/28/2023   DEXA SCAN  Completed   COVID-19 Vaccine  Completed   Hepatitis C Screening  Completed   PNA vac Low Risk Adult  Completed   MAMMOGRAM  Discontinued    Colorectal cancer screening: Completed 10/27/12. Repeat every 10 years  Mammogram status: No longer  required.    Bone Density- 06/24/17. Osteopenia. Taking Calcium -Vit D-Min-1200 BID, Calcium Vit D 500-200 BID.   Lung Cancer Screening: (Low Dose CT Chest recommended if Age 62-80 years, 30 pack-year currently smoking OR have quit w/in 15years.) does not qualify.   Hepatitis C Screening: Completed 01/11/20.  Vision Screening: Recommended annual ophthalmology exams for early detection of glaucoma and other disorders of the eye. Is the patient up to date with their annual eye exam?  Yes  Who is the provider or what is the name of the office in which the patient attends annual eye exams? [facility], Dr. Harland German.  Dental Screening: Recommended annual dental exams for proper oral hygiene. Visits every 6 months.   Community Resource Referral / Chronic Care Management: CRR required this visit?  No   CCM required this visit?  No      Plan:   Keep all routine maintenance appointments.   Follow up 02/12/20 @ 2:00  Medication refill request for xanax. Difficulty sleeping. Deferred to pcp.   I have personally reviewed and noted the following in the patient's chart:   Medical and social history Use of alcohol, tobacco or illicit  drugs  Current medications and supplements Functional ability and status Nutritional status Physical activity Advanced directives List of other physicians Hospitalizations, surgeries, and ER visits in previous 12 months Vitals Screenings to include cognitive, depression, and falls Referrals and appointments  In addition, I have reviewed and discussed with patient certain preventive protocols, quality metrics, and best practice recommendations. A written personalized care plan for preventive services as well as general preventive health recommendations were provided to patient via mychart.     OBrien-Blaney, Chimamanda Siegfried L, LPN   [id number]     I have reviewed the above information and agree with above.   Deborra Medina, MD

## 2020-01-17 NOTE — Telephone Encounter (Signed)
Unsuccessful attempt to reach patient for scheduled awv. No answer. Unable to leave a message. Reschedule as appropriate.

## 2020-01-29 DIAGNOSIS — G4733 Obstructive sleep apnea (adult) (pediatric): Secondary | ICD-10-CM | POA: Diagnosis not present

## 2020-02-12 ENCOUNTER — Telehealth: Payer: Self-pay

## 2020-02-12 ENCOUNTER — Ambulatory Visit: Payer: Medicare PPO | Admitting: Internal Medicine

## 2020-02-12 ENCOUNTER — Encounter: Payer: Self-pay | Admitting: Internal Medicine

## 2020-02-12 ENCOUNTER — Other Ambulatory Visit (HOSPITAL_COMMUNITY)
Admission: RE | Admit: 2020-02-12 | Discharge: 2020-02-12 | Disposition: A | Discharge status: Home / Self Care | Payer: Medicare PPO | Source: Ambulatory Visit | Attending: Internal Medicine | Admitting: Internal Medicine

## 2020-02-12 ENCOUNTER — Other Ambulatory Visit: Payer: Self-pay

## 2020-02-12 VITALS — BP 130/74 | HR 87 | Temp 97.7°F | Resp 16 | Ht 67.0 in | Wt 230.0 lb

## 2020-02-12 DIAGNOSIS — Z124 Encounter for screening for malignant neoplasm of cervix: Secondary | ICD-10-CM | POA: Diagnosis not present

## 2020-02-12 DIAGNOSIS — R140 Abdominal distension (gaseous): Secondary | ICD-10-CM | POA: Diagnosis not present

## 2020-02-12 DIAGNOSIS — F320 Major depressive disorder, single episode, mild: Secondary | ICD-10-CM | POA: Diagnosis not present

## 2020-02-12 DIAGNOSIS — K760 Fatty (change of) liver, not elsewhere classified: Secondary | ICD-10-CM | POA: Diagnosis not present

## 2020-02-12 DIAGNOSIS — I700 Atherosclerosis of aorta: Secondary | ICD-10-CM

## 2020-02-12 DIAGNOSIS — Z1151 Encounter for screening for human papillomavirus (HPV): Secondary | ICD-10-CM | POA: Diagnosis not present

## 2020-02-12 DIAGNOSIS — J479 Bronchiectasis, uncomplicated: Secondary | ICD-10-CM

## 2020-02-12 DIAGNOSIS — I4891 Unspecified atrial fibrillation: Secondary | ICD-10-CM

## 2020-02-12 MED ORDER — ZOSTER VAC RECOMB ADJUVANTED 50 MCG/0.5ML IM SUSR: 0.5000 mL | Freq: Once | INTRAMUSCULAR | 1 refills | Status: AC

## 2020-02-12 NOTE — Telephone Encounter (Signed)
Lft vm for pt to call ofc to sch.

## 2020-02-12 NOTE — Progress Notes (Addendum)
Subjective:  Patient: Heidi Walsh, female    DOB: [birth year]  Age: 74 y.o. MRN: [id number]  CC: The primary encounter diagnosis was Hepatic steatosis. Diagnoses of Cervical cancer screening, Bloating symptom, Thoracic aortic atherosclerosis (HCC), Current mild episode of major depressive disorder without prior episode ([place]), Bronchiectasis without complication ([place]), and Atrial fibrillation, unspecified type [name]) were also pertinent to this visit.  HPI Heidi Walsh presents for follow up on GAD, weight gain ,  And request for PAP smear  insominia using 1/4 alprazolam 0.25 mg to sleep.  Ribs still hurting from recent fall .  Left knee still tender and right 5th finger dislocated   Tolerating crestor once daily   Requesting PAP smear due to concern for a 3rd primary cancer ..  aslo has been feeling bloated despite moving bowels regularly   Family stressors :  Attending marriage counselling with husband but not satisfied with current counsellor.    Outpatient Medications Prior to Visit  Medication Sig Dispense Refill  . ALPRAZolam (XANAX) 0.25 MG tablet TAKE 1 TABLET BY MOUTH 2 TIMES DAILY AS NEEDED FOR ANXIETY 60 tablet 0  . aspirin 81 MG tablet Take 81 mg by mouth 2 (two) times daily.    . B Complex Vitamins (VITAMIN-B COMPLEX PO) Take 1 tablet by mouth daily.    Marland Kitchen buPROPion (WELLBUTRIN SR) 200 MG 12 hr tablet TAKE 1 TABLET BY MOUTH TWICE A DAY 180 tablet 1  . Calcium Carbonate-Vit D-Min (CALCIUM 1200 PO) Take 1 capsule by mouth daily.    . Calcium Carbonate-Vitamin D (CALCIUM-VITAMIN D) 500-200 MG-UNIT per tablet Take 1 tablet by mouth 2 (two) times daily with a meal.      . cetirizine (ZYRTEC) 10 MG tablet Take 10 mg by mouth daily.      Marland Kitchen diltiazem (CARDIZEM) 30 MG tablet Take 30 mg by mouth as needed.     . docusate sodium (COLACE) 100 MG capsule Take 100 mg by mouth 2 (two) times daily.    . hydrochlorothiazide (HYDRODIURIL) 50 MG tablet Take 50 mg by mouth daily.    .  Melatonin 10 MG CAPS Take 1 capsule by mouth daily.    . metoprolol succinate (TOPROL-XL) 25 MG 24 hr tablet Take 25 mg by mouth daily.    . Misc Natural Products (HEALTHY LIVER PO) Take 2 tablets by mouth daily.    . Multiple Vitamin (MULTIVITAMIN) tablet Take 1 tablet by mouth daily.      . Multiple Vitamins-Minerals (HAIR SKIN AND NAILS FORMULA PO) Take 3 tablets by mouth daily.    . nitroGLYCERIN (NITROSTAT) 0.4 MG SL tablet PLACE 1 TABLET UNDER THE TONGUE EVERY 5 MINUTES AS NEEDED FOR CHEST PAIN MAY TAKE UP TO 3 DOSES.    Marland Kitchen potassium chloride SA (KLOR-CON M20) 20 MEQ tablet Take 1 tablet (20 mEq total) by mouth 2 (two) times daily. 180 tablet 1  . Probiotic Product (TRUNATURE DIGESTIVE PROBIOTIC) CAPS Take by mouth.    . rosuvastatin (CRESTOR) 10 MG tablet Take 1 tablet (10 mg total) by mouth daily. 30 tablet 3  . S-Adenosylmethionine 200 MG TABS Take by mouth.    . TURMERIC PO Take 2 capsules by mouth daily.    Marland Kitchen HAWTHORNE PO Take 2 tablets by mouth daily. (Patient not taking: Reported on 02/12/2020)    . Milk Thistle 300 MG CAPS Take by mouth. (Patient not taking: Reported on 02/12/2020)     No facility-administered medications prior to visit.  Review of Systems;  Patient denies headache, fevers, malaise, unintentional weight loss, skin rash, eye pain, sinus congestion and sinus pain, sore throat, dysphagia,  hemoptysis , cough, dyspnea, wheezing, chest pain, palpitations, orthopnea, edema, abdominal pain, nausea, melena, diarrhea, constipation, flank pain, dysuria, hematuria, urinary  Frequency, nocturia, numbness, tingling, seizures,  Focal weakness, Loss of consciousness,  Tremor, insomnia, depression, anxiety, and suicidal ideation.      Objective:  BP 130/74 (BP Location: Right Arm, Patient Position: Sitting, Cuff Size: Large)   Pulse 87   Temp 97.7 F (36.5 C) (Oral)   Resp 16   Ht 5\' 7"  (1.702 m)   Wt 230 lb (104.3 kg)   SpO2 97%   BMI 36.02 kg/m   BP Readings from  Last 3 Encounters:  02/12/20 130/74  01/11/20 134/82  01/16/19 112/77    Wt Readings from Last 3 Encounters:  02/12/20 230 lb (104.3 kg)  01/17/20 227 lb (103 kg)  01/11/20 227 lb (103 kg)    General Appearance:    Alert, cooperative, no distress, appears stated age  Head:    Normocephalic, without obvious abnormality, atraumatic  Eyes:    PERRL, conjunctiva/corneas clear, EOM's intact, fundi    benign, both eyes  Ears:    Normal TM's and external ear canals, both ears  Nose:   Nares normal, septum midline, mucosa normal, no drainage    or sinus tenderness  Throat:   Lips, mucosa, and tongue normal; teeth and gums normal  Neck:   Supple, symmetrical, trachea midline, no adenopathy;    thyroid:  no enlargement/tenderness/nodules; no carotid   bruit or JVD  Back:     Symmetric, no curvature, ROM normal, no CVA tenderness  Lungs:     Clear to auscultation bilaterally, respirations unlabored  Chest Wall:    No tenderness or deformity   Heart:    Regular rate and rhythm, S1 and S2 normal, no murmur, rub   or gallop     Abdomen:     Soft, non-tender, bowel sounds active all four quadrants,    no masses, no organomegaly  Genitalia:    Pelvic: cervix normal in appearance, external genitalia normal, no adnexal masses or tenderness, no cervical motion tenderness, rectovaginal septum normal, uterus normal size, shape, and consistency and vagina normal without discharge  Extremities:   Extremities normal, atraumatic, no cyanosis or edema  Pulses:   2+ and symmetric all extremities  Skin:   Skin color, texture, turgor normal, no rashes or lesions  Lymph nodes:   Cervical, supraclavicular, and axillary nodes normal  Neurologic:   CNII-XII intact, normal strength, sensation and reflexes    throughout    Lab Results  Component Value Date   HGBA1C 5.8 (H) 01/11/2020   HGBA1C 5.8 11/24/2017   HGBA1C 5.7 12/30/2016    Lab Results  Component Value Date   CREATININE 0.74 02/12/2020    CREATININE 0.77 01/11/2020   CREATININE 0.68 01/16/2019    Lab Results  Component Value Date   WBC 5.7 01/11/2020   HGB 13.5 01/11/2020   HCT 39.2 01/11/2020   PLT 243 01/11/2020   GLUCOSE 117 (H) 02/12/2020   CHOL 185 01/11/2020   TRIG 140 01/11/2020   HDL 57 01/11/2020   LDLDIRECT 146.4 01/11/2013   LDLCALC 104 (H) 01/11/2020   ALT 19 02/12/2020   AST 20 02/12/2020   NA 139 02/12/2020   K 3.8 02/12/2020   CL 103 02/12/2020   CREATININE 0.74 02/12/2020   BUN 22  02/12/2020   CO2 26 02/12/2020   TSH 1.38 01/16/2019   INR 1.0 11/24/2017   HGBA1C 5.8 (H) 01/11/2020    CT Chest High Resolution  Result Date: 04/20/2019 CLINICAL DATA:  74 year old female with history of pulmonary nodule. Followup study. EXAM: CT CHEST WITHOUT CONTRAST TECHNIQUE: Multidetector CT imaging of the chest was performed following the standard protocol without intravenous contrast. High resolution imaging of the lungs, as well as inspiratory and expiratory imaging, was performed. COMPARISON:  Chest CT 09/26/2018. FINDINGS: Cardiovascular: Heart size is normal. There is no significant pericardial fluid, thickening or pericardial calcification. Aortic atherosclerosis. No calcifications in the coronary arteries. Mediastinum/Nodes: No pathologically enlarged mediastinal or hilar lymph nodes. Please note that accurate exclusion of hilar adenopathy is limited on noncontrast CT scans. Esophagus is unremarkable in appearance. No axillary lymphadenopathy. Surgical changes of lymph node dissection in the axillary regions bilaterally. Lungs/Pleura: A few scattered tiny 2-3 mm pulmonary nodules are noted in the lungs bilaterally, some of which are new compared to the prior study (example on axial image 100 of series 8), nonspecific. No larger more suspicious appearing pulmonary nodules or masses are noted. No acute consolidative airspace disease. No pleural effusions. Upper Abdomen: Diffuse low attenuation throughout the  visualized hepatic parenchyma, indicative of hepatic steatosis. Aortic atherosclerosis. 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 There are no aggressive appearing lytic or blastic lesions noted in the visualized portions of the skeleton. IMPRESSION: 1. Tiny 2-3 mm pulmonary nodules in the lungs, nonspecific but statistically likely benign. No follow-up needed if patient is low-risk (and has no known or suspected primary neoplasm). Non-contrast chest CT can be considered in 12 months if patient is high-risk. This recommendation follows the consensus statement: Guidelines for Management of Incidental Pulmonary Nodules Detected on CT Images: From the Fleischner Society 2017; Radiology 2017; 284:228-243. 2. Aortic atherosclerosis. 3. Hepatic steatosis. Electronically Signed   By: Vinnie Langton M.D.   On: 04/20/2019 14:27    Assessment & Plan:   Problem List Items Addressed This Visit      Unprioritized   Thoracic aortic atherosclerosis ([place])    Tolerating Crestor daily without side effects.  LFTs are normal   Lab Results  Component Value Date   CHOL 185 01/11/2020   HDL 57 01/11/2020   LDLCALC 104 (H) 01/11/2020   LDLDIRECT 146.4 01/11/2013   TRIG 140 01/11/2020   CHOLHDL 3.2 01/11/2020   . Lab Results  Component Value Date   ALT 19 02/12/2020   AST 20 02/12/2020   ALKPHOS 63 02/12/2020   BILITOT 0.3 02/12/2020          Hepatic steatosis - Primary    She is maintaining alcohol abstinence and working hard to lose weight       Relevant Orders   Comprehensive metabolic panel (Completed)   Current mild episode of major depressive disorder without prior episode ([place])    Symptoms are stable on wellbutrin 200 mg daily and she feels optimistic about managing her marital conflicts      Cervical cancer screening    Continued screening per patient request,  Justified by her history of CA with two primaries       Relevant  Orders   Cytology - PAP( ) (Completed)   Bronchiectasis ([place])    Likely secondary to fibrosis and traction given history of breast irradation for treatment of breast cancer.  Has had 2nd opinion from [facility] pulmonology ,  No indication for antibiotics.  At this time.  She has not had a productive cough in over 6 months       Bloating symptom    She has new symptoms of bloating without constipation.  Ruling out ovarian CA with CA 125 and pelvic  US      Relevant Orders   CA 125 (Completed)   US Pelvic Complete With Transvaginal   Atrial fibrillation ([place])    S/p ablation in 2014, with no recent episodes in over 6 months .  Continue metoprolol and prn cardizem.         I have discontinued Ysela E. Caputi's HAWTHORNE PO and Milk Thistle. I am also having her start on Zoster Vaccine Adjuvanted. Additionally, I am having her maintain her cetirizine, multivitamin, calcium-vitamin D, aspirin, metoprolol succinate, docusate sodium, hydrochlorothiazide, nitroGLYCERIN, diltiazem, buPROPion, B Complex Vitamins (VITAMIN-B COMPLEX PO), Trunature Digestive Probiotic, S-Adenosylmethionine, Calcium Carbonate-Vit D-Min (CALCIUM 1200 PO), Multiple Vitamins-Minerals (HAIR SKIN AND NAILS FORMULA PO), Misc Natural Products (HEALTHY LIVER PO), Melatonin, TURMERIC PO, potassium chloride SA, rosuvastatin, and ALPRAZolam.  Meds ordered this encounter  Medications  . Zoster Vaccine Adjuvanted [facility]) injection    Sig: Inject 0.5 mLs into the muscle once for 1 dose.    Dispense:  1 each    Refill:  1    Medications Discontinued During This Encounter  Medication Reason  . HAWTHORNE PO   . Milk Thistle 300 MG CAPS     Follow-up: Return in about 6 months (around 08/11/2020).   Crecencio Mc, MD

## 2020-02-12 NOTE — Telephone Encounter (Signed)
Pt called returning your call 

## 2020-02-12 NOTE — Telephone Encounter (Signed)
Pt returned your call about referral for [place]

## 2020-02-12 NOTE — Patient Instructions (Signed)
I'm gla you are tolerating the crestor.    Please note that your laboratory and other results may be visible to you in real time, possibly before they reach your provider. Please allow 48 hours for clinical interpretation of these results. Importantly, even if a result is flagged as "abnormal", it may not be one that impacts your health.         May you feel the blessing of our God, the love of Jesus Christ, and the comfort of the [employer] this week and in the weeks to come.  Happy Thanksgiving!  Regards,   Deborra Medina, MD

## 2020-02-13 ENCOUNTER — Telehealth: Payer: Self-pay | Admitting: Internal Medicine

## 2020-02-13 DIAGNOSIS — R140 Abdominal distension (gaseous): Secondary | ICD-10-CM | POA: Insufficient documentation

## 2020-02-13 DIAGNOSIS — Z124 Encounter for screening for malignant neoplasm of cervix: Secondary | ICD-10-CM | POA: Insufficient documentation

## 2020-02-13 LAB — COMPREHENSIVE METABOLIC PANEL
ALT: 19 U/L (ref 0–35)
AST: 20 U/L (ref 0–37)
Albumin: 4.5 g/dL (ref 3.5–5.2)
Alkaline Phosphatase: 63 U/L (ref 39–117)
BUN: 22 mg/dL (ref 6–23)
CO2: 26 mEq/L (ref 19–32)
Calcium: 9.7 mg/dL (ref 8.4–10.5)
Chloride: 103 mEq/L (ref 96–112)
Creatinine, Ser: 0.74 mg/dL (ref 0.40–1.20)
GFR: 80.87 mL/min (ref >60.00)
Glucose, Bld: 117 mg/dL — ABNORMAL HIGH (ref 70–99)
Potassium: 3.8 mEq/L (ref 3.5–5.1)
Sodium: 139 mEq/L (ref 135–145)
Total Bilirubin: 0.3 mg/dL (ref 0.2–1.2)
Total Protein: 7.2 g/dL (ref 6.0–8.3)

## 2020-02-13 LAB — CA 125: CA 125: 4 U/mL (ref <35)

## 2020-02-13 NOTE — Assessment & Plan Note (Signed)
She is maintaining alcohol abstinence and working hard to lose weight

## 2020-02-13 NOTE — Telephone Encounter (Signed)
Thanks you!

## 2020-02-13 NOTE — Assessment & Plan Note (Signed)
She has new symptoms of bloating without constipation.  Ruling out ovarian CA with CA 125 and pelvic  US

## 2020-02-13 NOTE — Assessment & Plan Note (Signed)
Tolerating Crestor daily without side effects.  LFTs are normal   Lab Results  Component Value Date   CHOL 185 01/11/2020   HDL 57 01/11/2020   LDLCALC 104 (H) 01/11/2020   LDLDIRECT 146.4 01/11/2013   TRIG 140 01/11/2020   CHOLHDL 3.2 01/11/2020   . Lab Results  Component Value Date   ALT 19 02/12/2020   AST 20 02/12/2020   ALKPHOS 63 02/12/2020   BILITOT 0.3 02/12/2020

## 2020-02-13 NOTE — Assessment & Plan Note (Signed)
Continued screening per patient request,  Justified by her history of CA with two primaries

## 2020-02-13 NOTE — Assessment & Plan Note (Signed)
Symptoms are stable on wellbutrin 200 mg daily and she feels optimistic about managing her marital conflicts

## 2020-02-13 NOTE — Telephone Encounter (Signed)
lft vm for pt to call ofc to sch call [phone] option 3 and then 2.

## 2020-02-16 NOTE — Progress Notes (Signed)
Your CA 125 was normal,  but the transvaginal  ultrasound still needs to be done to rule out ovarian CA .  Your Liver and kidney function are normal.

## 2020-02-18 LAB — CYTOLOGY - PAP
Comment: NEGATIVE
Diagnosis: NEGATIVE
High risk HPV: NEGATIVE

## 2020-02-18 NOTE — Assessment & Plan Note (Signed)
S/p ablation in 2014, with no recent episodes in over 6 months .  Continue metoprolol and prn cardizem.

## 2020-02-18 NOTE — Assessment & Plan Note (Signed)
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  Has had 2nd opinion from [facility] pulmonology ,  No indication for antibiotics. At this time.  She has not had a productive cough in over 6 months

## 2020-02-19 NOTE — Progress Notes (Signed)
  Your  PAP smear was satisfactory but the endocervical zone was not sampled due to "atrophy" (which occurs after menopause).   Since the high risk HPV screen was negative,  I recommend following the [employer] of [employer]ne[employer]'s  Position which is to repeat  the PAP smear in three years.      Regards,   Deborra Medina, MD

## 2020-02-28 DIAGNOSIS — G4733 Obstructive sleep apnea (adult) (pediatric): Secondary | ICD-10-CM | POA: Diagnosis not present

## 2020-02-29 ENCOUNTER — Other Ambulatory Visit: Payer: Self-pay

## 2020-02-29 ENCOUNTER — Ambulatory Visit
Admission: RE | Admit: 2020-02-29 | Discharge: 2020-02-29 | Disposition: A | Discharge status: Home / Self Care | Payer: Medicare PPO | Source: Ambulatory Visit | Attending: Internal Medicine | Admitting: Internal Medicine

## 2020-02-29 DIAGNOSIS — R140 Abdominal distension (gaseous): Secondary | ICD-10-CM | POA: Insufficient documentation

## 2020-02-29 DIAGNOSIS — Z780 Asymptomatic menopausal state: Secondary | ICD-10-CM | POA: Diagnosis not present

## 2020-02-29 DIAGNOSIS — R102 Pelvic and perineal pain: Secondary | ICD-10-CM | POA: Diagnosis not present

## 2020-02-29 NOTE — Progress Notes (Signed)
Larhonda,  Your ultrasound was unable to find your ovaries!  They have either atrophied (shrunk)  or were obscured by bowel.  Given the really low CA 125 and the fact that they were not visible,  I think the chance of ovarian CA is very very low.  However if you would like to see a GYN for reassurance,  let me know and I'll make a referral to anyone of your choice.   Regards,   Deborra Medina, MD

## 2020-03-04 DIAGNOSIS — R0789 Other chest pain: Secondary | ICD-10-CM | POA: Diagnosis not present

## 2020-03-04 DIAGNOSIS — R918 Other nonspecific abnormal finding of lung field: Secondary | ICD-10-CM | POA: Diagnosis not present

## 2020-03-04 DIAGNOSIS — I480 Paroxysmal atrial fibrillation: Secondary | ICD-10-CM | POA: Diagnosis not present

## 2020-03-04 DIAGNOSIS — I10 Essential (primary) hypertension: Secondary | ICD-10-CM | POA: Diagnosis not present

## 2020-03-04 DIAGNOSIS — G4733 Obstructive sleep apnea (adult) (pediatric): Secondary | ICD-10-CM | POA: Diagnosis not present

## 2020-03-05 ENCOUNTER — Ambulatory Visit: Payer: Medicare PPO | Admitting: Gastroenterology

## 2020-03-05 ENCOUNTER — Encounter: Payer: Self-pay | Admitting: Gastroenterology

## 2020-03-05 ENCOUNTER — Other Ambulatory Visit: Payer: Self-pay

## 2020-03-05 VITALS — BP 113/76 | HR 84 | Ht 67.0 in | Wt 228.2 lb

## 2020-03-05 DIAGNOSIS — M79609 Pain in unspecified limb: Secondary | ICD-10-CM | POA: Insufficient documentation

## 2020-03-05 DIAGNOSIS — K760 Fatty (change of) liver, not elsewhere classified: Secondary | ICD-10-CM

## 2020-03-05 DIAGNOSIS — Z96649 Presence of unspecified artificial hip joint: Secondary | ICD-10-CM | POA: Insufficient documentation

## 2020-03-05 DIAGNOSIS — M25669 Stiffness of unspecified knee, not elsewhere classified: Secondary | ICD-10-CM | POA: Insufficient documentation

## 2020-03-05 DIAGNOSIS — R609 Edema, unspecified: Secondary | ICD-10-CM | POA: Insufficient documentation

## 2020-03-05 NOTE — Progress Notes (Signed)
Gastroenterology Consultation  Referring Provider:     Crecencio Mc, MD Primary Care Physician:  Crecencio Mc, MD Primary Gastroenterologist:  Dr. Allen Norris     Reason for Consultation:     Fatty liver disease        HPI:   Heidi Walsh is a 74 y.o. y/o female referred for consultation & management of Fatty liver disease by Dr. Derrel Nip, Aris Everts, MD.  This patient comes in today with a report of fatty liver disease.  The patient states that she does not recall having any imaging that showed her to have fatty liver but she was told that she has fatty liver.  The patient was drinking wine on the weekends but has completely stopped that.  She has had historically normal liver enzymes.  The trending of her liver enzymes are shown:  Component     Latest Ref Rng & Units 12/23/2017 01/24/2018 01/16/2019 01/11/2020  Total Bilirubin     0.2 - 1.2 mg/dL 0.5   0.5  Alkaline phosphatase (APISO)     37 - 153 U/L    67  AST     0 - 37 U/L 17   21  ALT     0 - 35 U/L 16   22   Component     Latest Ref Rng & Units 02/12/2020  Total Bilirubin     0.2 - 1.2 mg/dL 0.3  Alkaline phosphatase (APISO)     37 - 153 U/L   AST     0 - 37 U/L 20  ALT     0 - 35 U/L 19   She denies any jaundice or any high risk activities for hepatitis.  The patient is married to a retired rheumatologist.She reports that she has had a double mastectomy for breast cancer in the past.  The patient's main concern today is that she has had bloating with a negative pelvic ultrasound and inability to lose weight.  The patient states that she has been on Weight Watchers and usually bottoms out at a weight loss of 9 pounds to 10 pounds and then can't lose any more weight.  Past Medical History:  Diagnosis Date  . Actinic keratosis    managed by Darlis Loan  . Atrial fibrillation [facility]) 2010   Followed by Dr. Jordan Hawks  . Bladder cancer ([place])   . Bladder cancer ([place]) 11/18/2016    1 cm papillar tumor covering   left UO  Resected at [facility] Sept 6 by Stephens Shire and ureteral stent placed.  Stent removed sept 20.  Repeat cystoscopy 3 months [facility]    . breast cancer 2010   s/p mastectomy  . breast cancer    s/p mastectomy   . Breast cyst 2003   aspirated, fibroystics breasts with stable calcifications on left breast  . Hypertension   . Leukopenia   . Mitral regurgitation    and prolapse  . Osteopenia 56   Stable by repeat scans    Past Surgical History:  Procedure Laterality Date  . ATRIAL ABLATION SURGERY  Dec 2012   [facility]  . CESAREAN SECTION     x 2  . KNEE ARTHROSCOPY W/ MENISCECTOMY  1997  . MASTECTOMY  02/2009   Bilateral  . MASTECTOMY     Right and Left  . SHOULDER SURGERY     left rotator cuff  . TOTAL HIP ARTHROPLASTY Right   . TOTAL KNEE ARTHROPLASTY  02/2014  Left knee    Prior to Admission medications   Medication Sig Start Date End Date Taking? Authorizing Provider  ALPRAZolam (XANAX) 0.25 MG tablet TAKE 1 TABLET BY MOUTH 2 TIMES DAILY AS NEEDED FOR ANXIETY 01/17/20   Crecencio Mc, MD  aspirin 81 MG tablet Take 81 mg by mouth 2 (two) times daily.    [provider]  B Complex Vitamins (VITAMIN-B COMPLEX PO) Take 1 tablet by mouth daily.    [provider]  buPROPion (WELLBUTRIN SR) 200 MG 12 hr tablet TAKE 1 TABLET BY MOUTH TWICE A DAY 11/15/19   Crecencio Mc, MD  Calcium Carbonate-Vit D-Min (CALCIUM 1200 PO) Take 1 capsule by mouth daily.    [provider]  Calcium Carbonate-Vitamin D (CALCIUM-VITAMIN D) 500-200 MG-UNIT per tablet Take 1 tablet by mouth 2 (two) times daily with a meal.      [provider]  cetirizine (ZYRTEC) 10 MG tablet Take 10 mg by mouth daily.      [provider]  diltiazem (CARDIZEM) 30 MG tablet Take 30 mg by mouth as needed.  12/08/18   [provider]  docusate sodium (COLACE) 100 MG capsule Take 100 mg by mouth 2 (two) times daily.    [provider]  hydrochlorothiazide  (HYDRODIURIL) 50 MG tablet Take 50 mg by mouth daily. 02/21/18   [provider]  Melatonin 10 MG CAPS Take 1 capsule by mouth daily.    [provider]  metoprolol succinate (TOPROL-XL) 25 MG 24 hr tablet Take 25 mg by mouth daily.    [provider]  Misc Natural Products (HEALTHY LIVER PO) Take 2 tablets by mouth daily.    [provider]  Multiple Vitamin (MULTIVITAMIN) tablet Take 1 tablet by mouth daily.      [provider]  Multiple Vitamins-Minerals (HAIR SKIN AND NAILS FORMULA PO) Take 3 tablets by mouth daily.    [provider]  nitroGLYCERIN (NITROSTAT) 0.4 MG SL tablet PLACE 1 TABLET UNDER THE TONGUE EVERY 5 MINUTES AS NEEDED FOR CHEST PAIN MAY TAKE UP TO 3 DOSES. 12/08/18   [provider]  potassium chloride SA (KLOR-CON M20) 20 MEQ tablet Take 1 tablet (20 mEq total) by mouth 2 (two) times daily. 01/16/20   Crecencio Mc, MD  Probiotic Product (TRUNATURE DIGESTIVE PROBIOTIC) CAPS Take by mouth.    [provider]  rosuvastatin (CRESTOR) 10 MG tablet Take 1 tablet (10 mg total) by mouth daily. 01/16/20   Crecencio Mc, MD  S-Adenosylmethionine 200 MG TABS Take by mouth. 05/24/18   [provider]  TURMERIC PO Take 2 capsules by mouth daily.    [provider]    Family History  Problem Relation Age of Onset  . COPD Mother   . Hypertension Father   . Hepatitis Sister        Hepatitis C from a dental procedure     Social History   Tobacco Use  . Smoking status: Never Smoker  . Smokeless tobacco: Never Used  Vaping Use  . Vaping Use: Never used  Substance Use Topics  . Alcohol use: Yes    Alcohol/week: 0.0 standard drinks    Comment: occ  . Drug use: No    Allergies as of 03/05/2020 - Review Complete 02/12/2020  Allergen Reaction Noted  . Oxycodone Nausea And Vomiting 12/21/2016  . Penicillins Anaphylaxis 11/09/2010  . Ancef [cefazolin] Swelling 08/20/2015  . Cephalexin Rash  07/23/2013  . Rivaroxaban  Nausea Only 01/29/2016    Review of Systems:    All systems reviewed and negative except where noted in HPI.   Physical Exam:  There were no vitals taken for this visit. No LMP recorded. Patient is postmenopausal. General:   Alert,  Well-developed, well-nourished, pleasant and cooperative in NAD Head:  Normocephalic and atraumatic. Eyes:  Sclera clear, no icterus.   Conjunctiva pink. Ears:  Normal auditory acuity. Neck:  Supple; no masses or thyromegaly. Lungs:  Respirations even and unlabored.  Clear throughout to auscultation.   No wheezes, crackles, or rhonchi. No acute distress. Heart:  Irregular rate and rhythm; no murmurs, clicks, rubs, or gallops. Abdomen:  Normal bowel sounds.  No bruits.  Soft, non-tender and non-distended without masses, hepatosplenomegaly or hernias noted.  No guarding or rebound tenderness.  Negative Carnett sign.   Rectal:  Deferred.  Pulses:  Normal pulses noted. Extremities:  No clubbing or edema.  No cyanosis. Neurologic:  Alert and oriented x3;  grossly normal neurologically. Skin:  Intact without significant lesions or rashes.  No jaundice. Lymph Nodes:  No significant cervical adenopathy. Psych:  Alert and cooperative. Normal mood and affect.  Imaging Studies: US Pelvic Complete With Transvaginal  Result Date: 02/29/2020 CLINICAL DATA:  Pelvic bloating for 1 year, question ovarian cancer; history of breast cancer; postmenopausal EXAM: TRANSABDOMINAL AND TRANSVAGINAL ULTRASOUND OF PELVIS TECHNIQUE: Both transabdominal and transvaginal ultrasound examinations of the pelvis were performed. Transabdominal technique was performed for global imaging of the pelvis including uterus, ovaries, adnexal regions, and pelvic cul-de-sac. It was necessary to proceed with endovaginal exam following the transabdominal exam to visualize the uterus, endometrium, and ovaries. COMPARISON:  None FINDINGS: Uterus Measurements: 7.2 x 2.2 x 5.0 cm =  volume: 42 mL. Anteverted. Heterogeneous myometrial echogenicity. No discrete mass Endometrium Thickness: 4 mm. Small amount of nonspecific endometrial fluid. No discrete mass. Right ovary Not visualized, question atrophic versus obscured by bowel Left ovary Not visualized, question atrophic versus obscured by bowel Other findings No free pelvic fluid.  No adnexal masses. IMPRESSION: Nonvisualization of ovaries. Small amount of nonspecific endometrial fluid. Otherwise negative exam. Electronically Signed   By: Lavonia Dana M.D.   On: 02/29/2020 14:10    Assessment and Plan:   Heidi Walsh is a 74 y.o. y/o female who comes in today with a report of nonalcoholic fatty liver disease. I cannot find any imaging that shows fatty liver and the patient denies having had any recent imaging that showed her to have fatty liver.  The patient's liver enzymes have been normal and she states that she has stopped drinking.  The patient has been told that her bloating may be due to not completely evacuating her intestines causing her to have bloating and she has been told to try to increase fiber in her diet.  The patient will also be set up for a right upper quadrant ultrasound to look for fatty liver disease. The patient had an upper endoscopy last year at [facility] by Dr. Denice Paradise and she states that she is not due for colonoscopy since she had a colonoscopy 7 years ago and was told to have another one 10 years after the previous one. The patient will be notified with the results of her tests done today.  The patient has been explained plan and agrees with it.    Lucilla Lame, MD. Marval Regal    Note: This dictation was prepared with Dragon dictation along with smaller phrase technology. Any transcriptional errors that result from  this process are unintentional.

## 2020-03-17 ENCOUNTER — Other Ambulatory Visit: Payer: Self-pay

## 2020-03-17 ENCOUNTER — Telehealth: Payer: Self-pay

## 2020-03-17 ENCOUNTER — Ambulatory Visit
Admission: RE | Admit: 2020-03-17 | Discharge: 2020-03-17 | Disposition: A | Discharge status: Home / Self Care | Payer: Medicare PPO | Source: Ambulatory Visit | Attending: Gastroenterology | Admitting: Gastroenterology

## 2020-03-17 DIAGNOSIS — K760 Fatty (change of) liver, not elsewhere classified: Secondary | ICD-10-CM | POA: Insufficient documentation

## 2020-03-17 NOTE — Telephone Encounter (Signed)
Pt notified of US results.  

## 2020-03-17 NOTE — Telephone Encounter (Signed)
-----   Message from Lucilla Lame, MD sent at 03/17/2020  8:56 AM EST ----- At the patient know that the ultrasound showed fatty liver without any signs of cirrhosis.  With normal liver enzymes she should try to lose about probably 7% of her body weight and monitor her liver enzymes every 6 months to make sure that the liver enzymes do not increase.

## 2020-03-17 NOTE — Telephone Encounter (Signed)
-----   Message from Midge Minium, MD sent at 03/17/2020  8:56 AM EST ----- At the patient know that the ultrasound showed fatty liver without any signs of cirrhosis.  With normal liver enzymes she should try to lose about probably 7% of her body weight and monitor her liver enzymes every 6 months to make sure that the liver enzymes do not increase.

## 2020-03-28 ENCOUNTER — Other Ambulatory Visit: Payer: Self-pay

## 2020-03-28 ENCOUNTER — Other Ambulatory Visit (INDEPENDENT_AMBULATORY_CARE_PROVIDER_SITE_OTHER): Payer: Medicare PPO

## 2020-03-28 DIAGNOSIS — K760 Fatty (change of) liver, not elsewhere classified: Secondary | ICD-10-CM | POA: Diagnosis not present

## 2020-03-28 NOTE — Addendum Note (Signed)
Addended by: Leeanne Rio on: 03/28/2020 02:04 PM   Modules accepted: Orders

## 2020-03-29 LAB — COMPREHENSIVE METABOLIC PANEL
ALT: 22 IU/L (ref 0–32)
AST: 24 IU/L (ref 0–40)
Albumin/Globulin Ratio: 1.5 (ref 1.2–2.2)
Albumin: 4.3 g/dL (ref 3.7–4.7)
Alkaline Phosphatase: 79 IU/L (ref 44–121)
BUN/Creatinine Ratio: 21 (ref 12–28)
BUN: 16 mg/dL (ref 8–27)
Bilirubin Total: 0.3 mg/dL (ref 0.0–1.2)
CO2: 24 mmol/L (ref 20–29)
Calcium: 9.7 mg/dL (ref 8.7–10.3)
Chloride: 103 mmol/L (ref 96–106)
Creatinine, Ser: 0.77 mg/dL (ref 0.57–1.00)
GFR calc Af Amer: 89 mL/min/{1.73_m2} (ref >59)
GFR calc non Af Amer: 77 mL/min/{1.73_m2} (ref >59)
Globulin, Total: 2.8 g/dL (ref 1.5–4.5)
Glucose: 106 mg/dL — ABNORMAL HIGH (ref 65–99)
Potassium: 4.6 mmol/L (ref 3.5–5.2)
Sodium: 141 mmol/L (ref 134–144)
Total Protein: 7.1 g/dL (ref 6.0–8.5)

## 2020-03-29 NOTE — Progress Notes (Signed)
Jane,  Your liver enzymes remain normal, so if you are tolerating your current medications, please continue them .  Regards,   Deborra Medina, MD

## 2020-03-30 DIAGNOSIS — G4733 Obstructive sleep apnea (adult) (pediatric): Secondary | ICD-10-CM | POA: Diagnosis not present

## 2020-03-31 ENCOUNTER — Other Ambulatory Visit: Payer: Self-pay | Admitting: Internal Medicine

## 2020-03-31 MED ORDER — POTASSIUM CHLORIDE CRYS ER 20 MEQ PO TBCR: 20.0000 meq | EXTENDED_RELEASE_TABLET | Freq: Every day | ORAL | 1 refills | Status: DC

## 2020-04-03 DIAGNOSIS — G4733 Obstructive sleep apnea (adult) (pediatric): Secondary | ICD-10-CM | POA: Diagnosis not present

## 2020-04-10 ENCOUNTER — Other Ambulatory Visit: Payer: Self-pay | Admitting: Internal Medicine

## 2020-04-11 NOTE — Telephone Encounter (Signed)
RX Refill:xanax Last Seen:02-12-20 Last ordered:01-17-20

## 2020-04-28 DIAGNOSIS — Z86018 Personal history of other benign neoplasm: Secondary | ICD-10-CM | POA: Diagnosis not present

## 2020-04-28 DIAGNOSIS — L570 Actinic keratosis: Secondary | ICD-10-CM | POA: Diagnosis not present

## 2020-04-28 DIAGNOSIS — Z859 Personal history of malignant neoplasm, unspecified: Secondary | ICD-10-CM | POA: Diagnosis not present

## 2020-04-28 DIAGNOSIS — Z85828 Personal history of other malignant neoplasm of skin: Secondary | ICD-10-CM | POA: Diagnosis not present

## 2020-04-28 DIAGNOSIS — L821 Other seborrheic keratosis: Secondary | ICD-10-CM | POA: Diagnosis not present

## 2020-04-28 DIAGNOSIS — Z85820 Personal history of malignant melanoma of skin: Secondary | ICD-10-CM | POA: Diagnosis not present

## 2020-04-28 DIAGNOSIS — L578 Other skin changes due to chronic exposure to nonionizing radiation: Secondary | ICD-10-CM | POA: Diagnosis not present

## 2020-04-28 DIAGNOSIS — Z872 Personal history of diseases of the skin and subcutaneous tissue: Secondary | ICD-10-CM | POA: Diagnosis not present

## 2020-04-30 DIAGNOSIS — G4733 Obstructive sleep apnea (adult) (pediatric): Secondary | ICD-10-CM | POA: Diagnosis not present

## 2020-05-13 ENCOUNTER — Other Ambulatory Visit: Payer: Self-pay | Admitting: Internal Medicine

## 2020-06-27 IMAGING — CT CT CHEST W/ CM
2 of 3 series · 15 of 36 positions shown, 18 images · IV contrast (omnipaque)
Comparison: Chest radiographs obtained yesterday.

CLINICAL DATA: Subtle focal opacity in the upper right lung on a
frontal chest radiograph obtained yesterday. Currently asymptomatic.
History of breast and bladder cancer.

EXAM:
CT CHEST WITH CONTRAST
TECHNIQUE: Multidetector CT imaging of the chest was performed during
intravenous contrast administration.
CONTRAST:  75mL OMNIPAQUE IOHEXOL 300 MG/ML  SOLN

[Series 2: axial st · axial · 0.69mm/px · z∈[-564,-280]mm · 12 of 168 slices shown, 15 images]
[im 13/168  mediastinal]
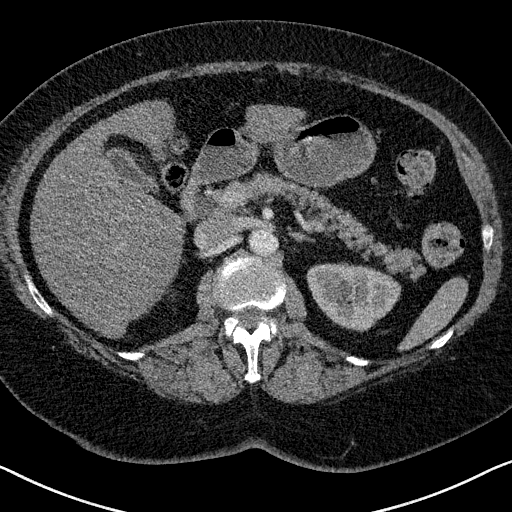
[im 13/168  lung]
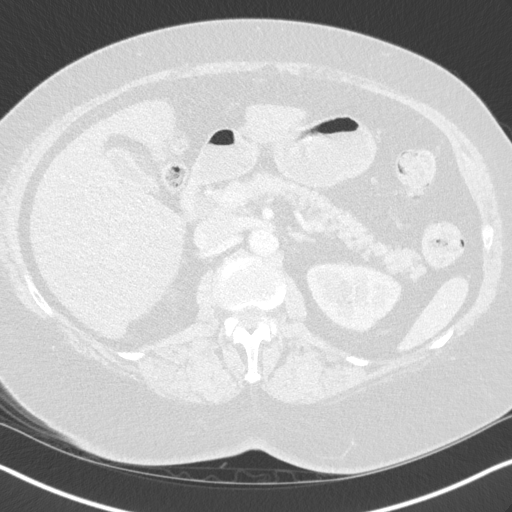
[im 25/168  lung]
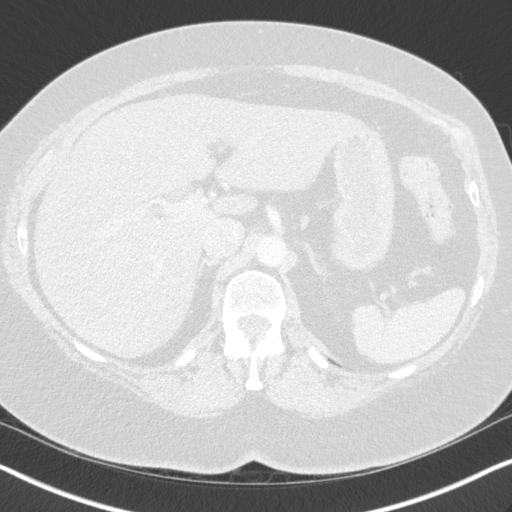
[im 38/168  lung]
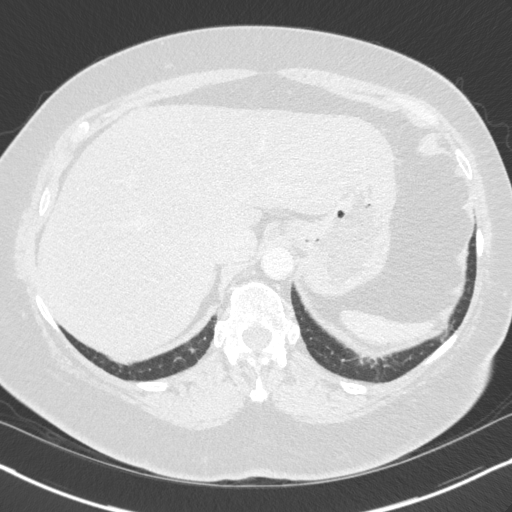
[im 50/168  lung]
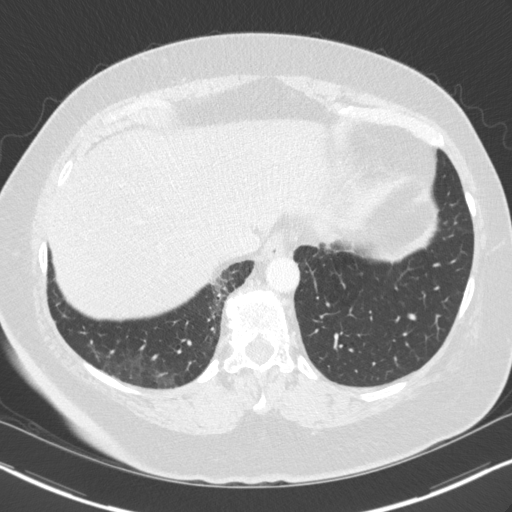
[im 62/168  mediastinal]
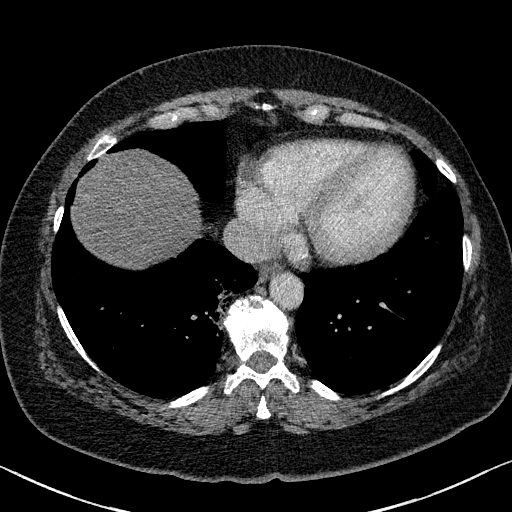
[im 62/168  lung]
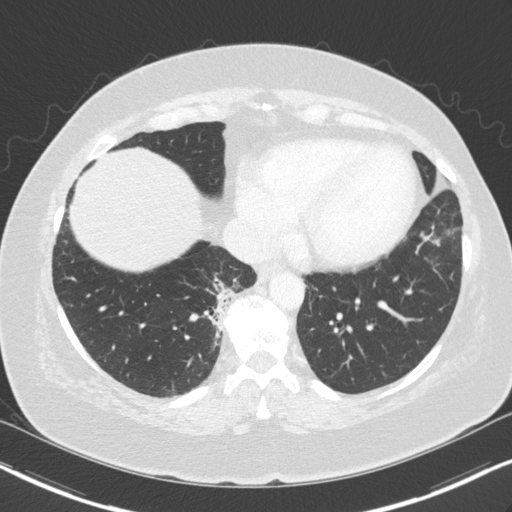
[im 75/168  lung]
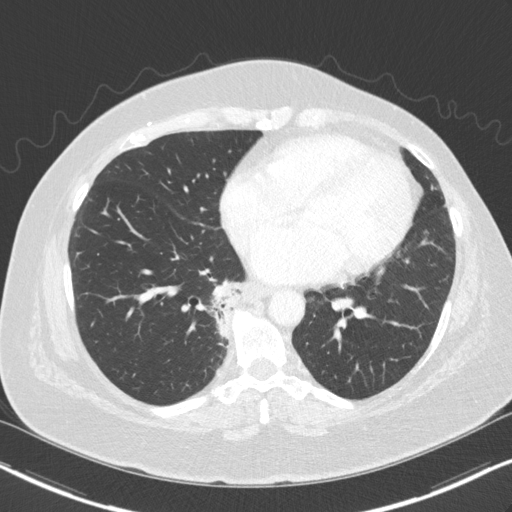
[im 93/168  lung]
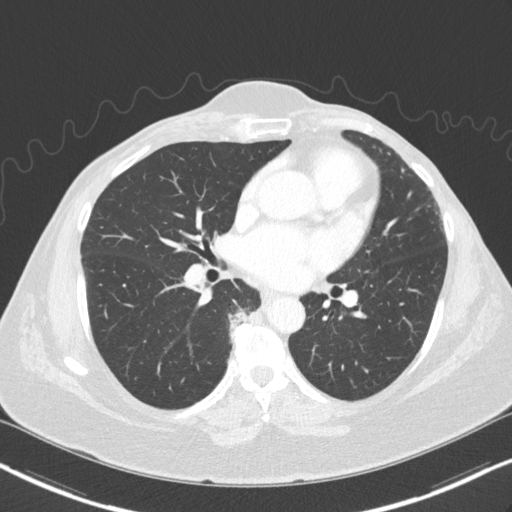
[im 106/168  lung]
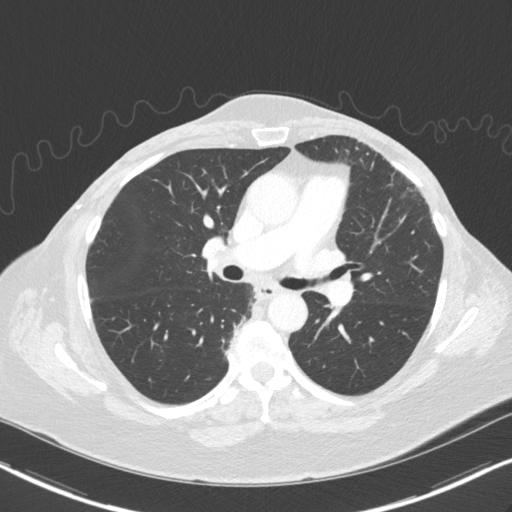
[im 118/168  mediastinal]
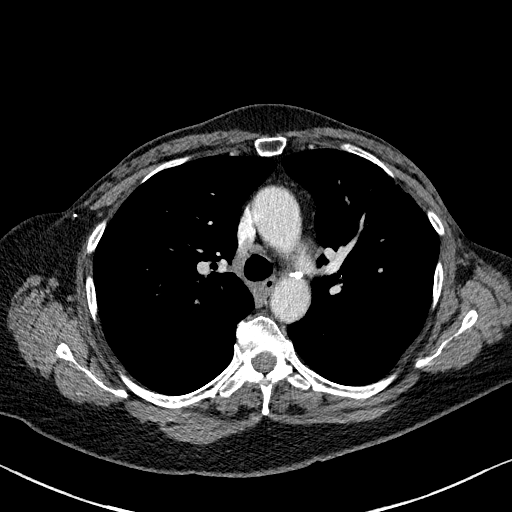
[im 118/168  lung]
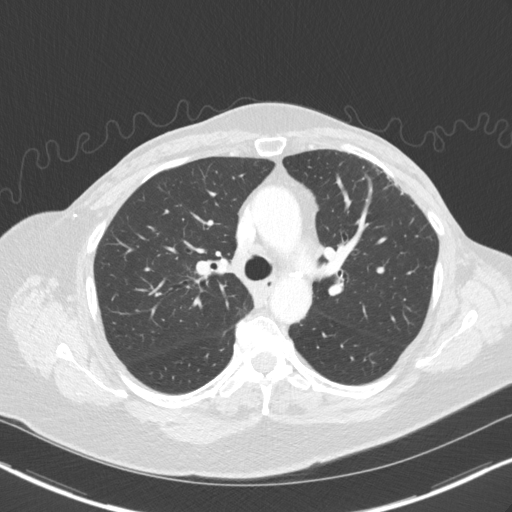
[im 130/168  lung]
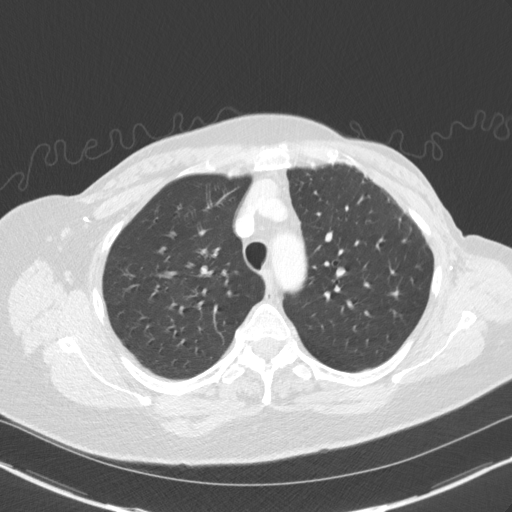
[im 143/168  lung]
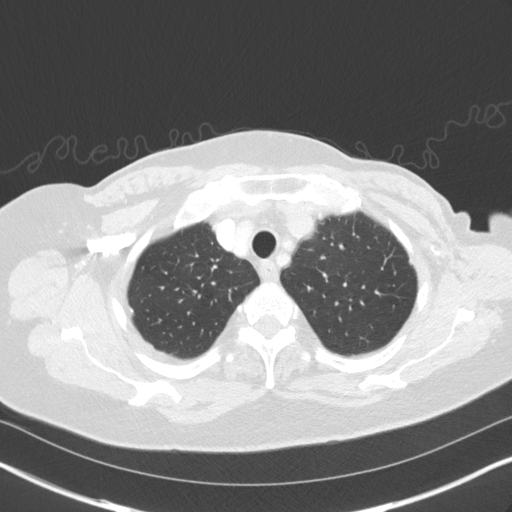
[im 155/168  lung]
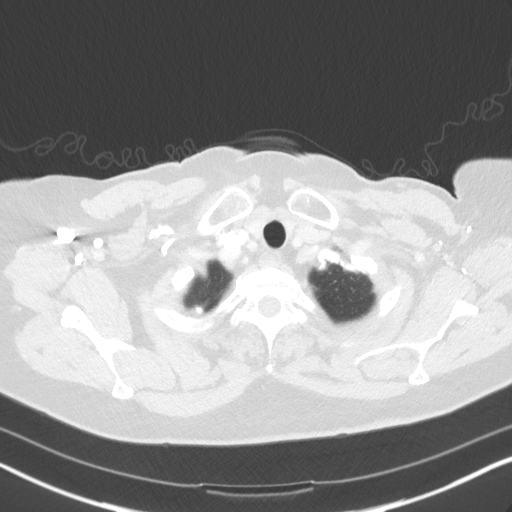

[Series 5: coronal · coronal · 0.69mm/px · 3 of 143 slices shown]
[im 29/143  lung]
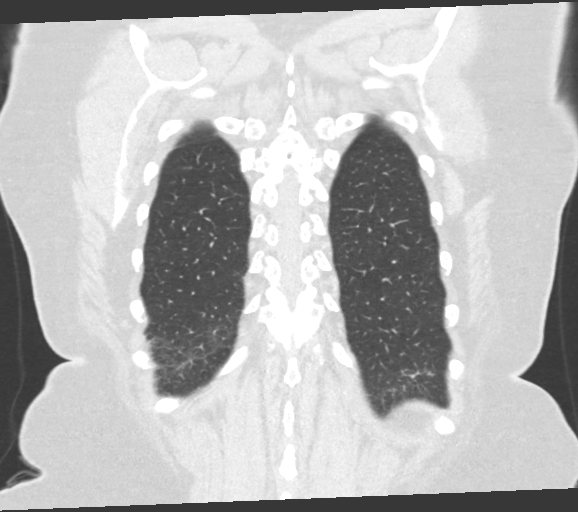
[im 57/143  lung]
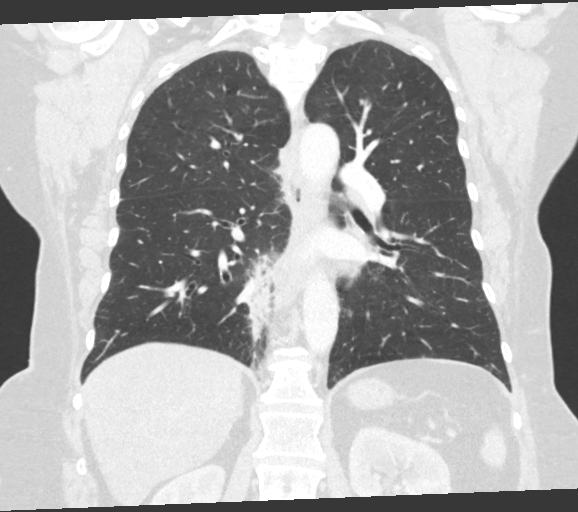
[im 86/143  lung]
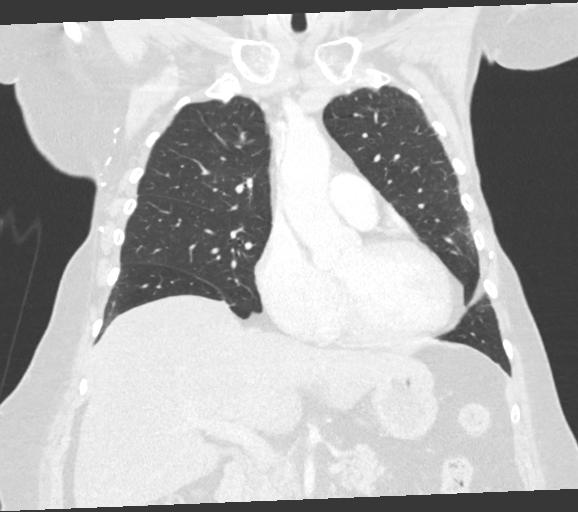

[15 of 36 positions shown; findings below may reference images not displayed]

FINDINGS: Cardiovascular: No significant vascular findings. Normal heart size.
No pericardial effusion.

Mediastinum/Nodes: No enlarged mediastinal, hilar, or axillary lymph
nodes. Thyroid gland, trachea, and esophagus demonstrate no
significant findings.

Lungs/Pleura: Area of cylindrical bronchiectasis and patchy density
in the medial aspect of the right lower lobe. No right upper lung
zone nodule or consolidation. Small amount of patchy density in the
left lower lobe laterally and inferiorly. No pleural fluid.

Upper Abdomen: Diffuse low density of the liver relative to the
spleen.

Musculoskeletal: Thoracic spine degenerative changes. Bilateral
postmastectomy changes.
IMPRESSION: 1. Area of cylindrical bronchiectasis and patchy density in the
medial aspect of the right lower lobe. The patchy density could
represent scarring, inflammation or infection.
2. Small amount of patchy density in the left lower lobe laterally
and inferiorly. This could also represent scarring, inflammation or
infection.
3. Diffuse hepatic steatosis.

## 2020-07-29 ENCOUNTER — Other Ambulatory Visit: Payer: Self-pay | Admitting: Internal Medicine

## 2020-08-11 ENCOUNTER — Ambulatory Visit: Payer: Medicare PPO | Admitting: Internal Medicine

## 2020-08-11 ENCOUNTER — Encounter: Payer: Self-pay | Admitting: Internal Medicine

## 2020-08-11 ENCOUNTER — Other Ambulatory Visit: Payer: Self-pay

## 2020-08-11 VITALS — BP 122/76 | HR 96 | Temp 96.6°F | Resp 15 | Ht 67.0 in | Wt 229.2 lb

## 2020-08-11 DIAGNOSIS — I700 Atherosclerosis of aorta: Secondary | ICD-10-CM

## 2020-08-11 DIAGNOSIS — Z630 Problems in relationship with spouse or partner: Secondary | ICD-10-CM

## 2020-08-11 DIAGNOSIS — J479 Bronchiectasis, uncomplicated: Secondary | ICD-10-CM

## 2020-08-11 DIAGNOSIS — E669 Obesity, unspecified: Secondary | ICD-10-CM

## 2020-08-11 DIAGNOSIS — I4891 Unspecified atrial fibrillation: Secondary | ICD-10-CM

## 2020-08-11 DIAGNOSIS — K760 Fatty (change of) liver, not elsewhere classified: Secondary | ICD-10-CM

## 2020-08-11 DIAGNOSIS — R9430 Abnormal result of cardiovascular function study, unspecified: Secondary | ICD-10-CM | POA: Diagnosis not present

## 2020-08-11 DIAGNOSIS — F320 Major depressive disorder, single episode, mild: Secondary | ICD-10-CM | POA: Diagnosis not present

## 2020-08-11 DIAGNOSIS — T502X5A Adverse effect of carbonic-anhydrase inhibitors, benzothiadiazides and other diuretics, initial encounter: Secondary | ICD-10-CM

## 2020-08-11 DIAGNOSIS — C801 Malignant (primary) neoplasm, unspecified: Secondary | ICD-10-CM | POA: Diagnosis not present

## 2020-08-11 DIAGNOSIS — G709 Myoneural disorder, unspecified: Secondary | ICD-10-CM | POA: Diagnosis not present

## 2020-08-11 DIAGNOSIS — D0360 Melanoma in situ of unspecified upper limb, including shoulder: Secondary | ICD-10-CM

## 2020-08-11 DIAGNOSIS — E876 Hypokalemia: Secondary | ICD-10-CM

## 2020-08-11 DIAGNOSIS — I10 Essential (primary) hypertension: Secondary | ICD-10-CM

## 2020-08-11 LAB — COMPREHENSIVE METABOLIC PANEL
ALT: 19 U/L (ref 0–35)
AST: 17 U/L (ref 0–37)
Albumin: 4.6 g/dL (ref 3.5–5.2)
Alkaline Phosphatase: 74 U/L (ref 39–117)
BUN: 18 mg/dL (ref 6–23)
CO2: 23 mEq/L (ref 19–32)
Calcium: 9.7 mg/dL (ref 8.4–10.5)
Chloride: 105 mEq/L (ref 96–112)
Creatinine, Ser: 0.71 mg/dL (ref 0.40–1.20)
GFR: 84.69 mL/min (ref >60.00)
Glucose, Bld: 99 mg/dL (ref 70–99)
Potassium: 4.6 mEq/L (ref 3.5–5.1)
Sodium: 139 mEq/L (ref 135–145)
Total Bilirubin: 0.4 mg/dL (ref 0.2–1.2)
Total Protein: 7.3 g/dL (ref 6.0–8.3)

## 2020-08-11 MED ORDER — METFORMIN HCL ER 500 MG PO TB24: 500.0000 mg | ORAL_TABLET | Freq: Every day | ORAL | 5 refills | Status: DC

## 2020-08-11 MED ORDER — ALPRAZOLAM 0.25 MG PO TABS: ORAL_TABLET | ORAL | 1 refills | Status: DC

## 2020-08-11 NOTE — Patient Instructions (Signed)
Trial of metformin XR to curb appetite and help manage fatty liver .  If no results in one month,  I recommend Optavia or Wegovy and have made a referral to [place] ,  Our clinical pharmacist for her assistance in getting it  Metformin Extended-Release Tablets What is this medicine? METFORMIN (met FOR min) is used to treat type 2 diabetes. It helps to control blood sugar. Treatment is combined with diet and exercise. This medicine can be used alone or with other medicines for diabetes. This medicine may be used for other purposes; ask your health care provider or pharmacist if you have questions. COMMON BRAND NAME(S): Fortamet, Glucophage XR, Glumetza What should I tell my health care provider before I take this medicine? They need to know if you have any of these conditions:  anemia  dehydration  heart disease  frequently drink alcohol-containing beverages  kidney disease  liver disease  polycystic ovary syndrome  serious infection or injury  vomiting  an unusual or allergic reaction to metformin, other medicines, foods, dyes, or preservatives  pregnant or trying to get pregnant  breast-feeding How should I use this medicine? Take this medicine by mouth with a glass of water. Follow the directions on the prescription label. Take this medicine with food. Take your medicine at regular intervals. Do not take your medicine more often than directed. Do not stop taking except on your doctor's advice. Talk to your pediatrician regarding the use of this medicine in children. Special care may be needed. Overdosage: If you think you have taken too much of this medicine contact a poison control center or emergency room at once. NOTE: This medicine is only for you. Do not share this medicine with others. What if I miss a dose? If you miss a dose, take it as soon as you can. If it is almost time for your next dose, take only that dose. Do not take double or extra doses. What may  interact with this medicine? Do not take this medicine with any of the following medications:  certain contrast medicines given before X-rays, CT scans, MRI, or other procedures  dofetilide This medicine may also interact with the following medications:  acetazolamide  alcohol  certain antivirals for HIV or hepatitis  certain medicines for blood pressure, heart disease, irregular heart beat  cimetidine  dichlorphenamide  digoxin  diuretics  female hormones, like estrogens or progestins and birth control pills  glycopyrrolate  isoniazid  lamotrigine  memantine  methazolamide  metoclopramide  midodrine  niacin  phenothiazines like chlorpromazine, mesoridazine, prochlorperazine, thioridazine  phenytoin  ranolazine  steroid medicines like prednisone or cortisone  stimulant medicines for attention disorders, weight loss, or to stay awake  thyroid medicines  topiramate  trospium  vandetanib  zonisamide This list may not describe all possible interactions. Give your health care provider a list of all the medicines, herbs, non-prescription drugs, or dietary supplements you use. Also tell them if you smoke, drink alcohol, or use illegal drugs. Some items may interact with your medicine. What should I watch for while using this medicine? Visit your doctor or health care professional for regular checks on your progress. A test called the HbA1C (A1C) will be monitored. This is a simple blood test. It measures your blood sugar control over the last 2 to 3 months. You will receive this test every 3 to 6 months. Learn how to check your blood sugar. Learn the symptoms of low and high blood sugar and how to manage  them. Always carry a quick-source of sugar with you in case you have symptoms of low blood sugar. Examples include hard sugar candy or glucose tablets. Make sure others know that you can choke if you eat or drink when you develop serious symptoms of low  blood sugar, such as seizures or unconsciousness. They must get medical help at once. Tell your doctor or health care professional if you have high blood sugar. You might need to change the dose of your medicine. If you are sick or exercising more than usual, you might need to change the dose of your medicine. Do not skip meals. Ask your doctor or health care professional if you should avoid alcohol. Many nonprescription cough and cold products contain sugar or alcohol. These can affect blood sugar. This medicine may cause ovulation in premenopausal women who do not have regular monthly periods. This may increase your chances of becoming pregnant. You should not take this medicine if you become pregnant or think you may be pregnant. Talk with your doctor or health care professional about your birth control options while taking this medicine. Contact your doctor or health care professional right away if you think you are pregnant. The tablet shell for some brands of this medicine does not dissolve. This is normal. The tablet shell may appear whole in the stool. This is not a cause for concern. If you are going to need surgery, a MRI, CT scan, or other procedure, tell your doctor that you are taking this medicine. You may need to stop taking this medicine before the procedure. Wear a medical ID bracelet or chain, and carry a card that describes your disease and details of your medicine and dosage times. This medicine may cause a decrease in folic acid and vitamin B12. You should make sure that you get enough vitamins while you are taking this medicine. Discuss the foods you eat and the vitamins you take with your health care professional. What side effects may I notice from receiving this medicine? Side effects that you should report to your doctor or health care professional as soon as possible:  allergic reactions like skin rash, itching or hives, swelling of the face, lips, or tongue  breathing  problems  feeling faint or lightheaded, falls  muscle aches or pains  signs and symptoms of low blood sugar such as feeling anxious, confusion, dizziness, increased hunger, unusually weak or tired, sweating, shakiness, cold, irritable, headache, blurred vision, fast heartbeat, loss of consciousness  slow or irregular heartbeat  unusual stomach pain or discomfort  unusually tired or weak Side effects that usually do not require medical attention (report to your doctor or health care professional if they continue or are bothersome):  diarrhea  headache  heartburn  metallic taste in mouth  nausea  stomach gas, upset This list may not describe all possible side effects. Call your doctor for medical advice about side effects. You may report side effects to FDA at 1-800-FDA-1088. Where should I keep my medicine? Keep out of the reach of children. Store at room temperature between 15 and 30 degrees C (59 and 86 degrees F). Protect from light. Throw away any unused medicine after the expiration date. NOTE: This sheet is a summary. It may not cover all possible information. If you have questions about this medicine, talk to your doctor, pharmacist, or health care provider.  2021 [employer]vier/Gold Standard (2020-02-03 10:29:57)

## 2020-08-11 NOTE — Progress Notes (Signed)
Subjective:  Patient ID: Heidi Walsh, female    DOB: [birth year]  Age: 75 y.o. MRN: [id number]  CC: The primary encounter diagnosis was Hepatic steatosis. Diagnoses of Atrial fibrillation, unspecified type ([place]), Abnormal cardiac function test, Neuromuscular disorder ([place]), Current mild episode of major depressive disorder without prior episode [facility]), Thoracic aortic atherosclerosis ([place]), Bronchiectasis without complication ([place]), Cancer ([place]), Diuretic-induced hypokalemia, Essential hypertension, Obesity (BMI 30-39.9), and Marital conflict were also pertinent to this visit.  HPI [name] presents for follow up on multiple issues including hypertension, obesity with fatty liver, chronic joint pain,  And marital conflict   This visit occurred during the SARS-CoV-2 public health emergency.  Safety protocols were in place, including screening questions prior to the visit, additional usage of staff PPE, and extensive cleaning of exam room while observing appropriate contact time as indicated for disinfecting solutions.    Hypokalemia:  Secondary to use of hctz.  Now Taking potassium supplement AND spironolactone.  hctz stopped.  Last K was 4.6 in January  Taking SAM-E 400 mg daily for orthopedic issues with significant improvement . Not swimming due to secondary arthritis due ot previous shoulder injuries,  Has jointed [employer],  But avoids  going back to the gym yet  Due to absence of COVID precautions at gym.  But exercising at home. Doing 16 minutes on the bike  16 minutes on the rebounder.    Insomnia secondary GAD: using melatonin 10 mg at 7 pm and extremely low dose of alprazolam (1/4 of 0.25 mg) qhs for insomnia.   Obesity:  Portion size  Identified as barrier to weight loss Has gained 40 lb since diagnosis and treatment of OSA .  Previous treatment failures with Weight [employer] and Noom.    Seeing dermatology  for melanoma in situ.    Marital  Issues:  Feels like she  has entered a new phase of marriage.  Feels overworked wearing all the hats in the relationship , but resigned to her role,  except that her husband's untidiness and carelessness has created safety hazards that have already resulted injuries due to her tripping over tools,  Shoes he has left out,  Discussed behavior modification as a way to force change (advised her to hide his shoes,  Tools when he leaves them out , followed by throwing them away if this does not work.  List of therapists given.    Outpatient Medications Prior to Visit  Medication Sig Dispense Refill  . aspirin 81 MG tablet Take 81 mg by mouth 2 (two) times daily.    . B Complex Vitamins (VITAMIN-B COMPLEX PO) Take 1 tablet by mouth daily.    Marland Kitchen buPROPion (WELLBUTRIN SR) 200 MG 12 hr tablet TAKE 1 TABLET BY MOUTH TWICE A DAY 180 tablet 1  . Calcium Carbonate-Vit D-Min (CALCIUM 1200 PO) Take 1 capsule by mouth daily.    . Calcium Carbonate-Vitamin D (CALCIUM-VITAMIN D) 500-200 MG-UNIT per tablet Take 1 tablet by mouth 2 (two) times daily with a meal.    . cetirizine (ZYRTEC) 10 MG tablet Take 10 mg by mouth daily.    Marland Kitchen diltiazem (CARDIZEM) 30 MG tablet Take 30 mg by mouth as needed.     . docusate sodium (COLACE) 100 MG capsule Take 100 mg by mouth 2 (two) times daily.    . Melatonin 10 MG CAPS Take 1 capsule by mouth daily.    . metoprolol succinate (TOPROL-XL) 25 MG 24 hr tablet Take 25 mg by  mouth daily.    . Misc Natural Products (HEALTHY LIVER PO) Take 2 tablets by mouth daily.    . Multiple Vitamin (MULTIVITAMIN) tablet Take 1 tablet by mouth daily.    . Multiple Vitamins-Minerals (HAIR SKIN AND NAILS FORMULA PO) Take 3 tablets by mouth daily.    . nitroGLYCERIN (NITROSTAT) 0.4 MG SL tablet PLACE 1 TABLET UNDER THE TONGUE EVERY 5 MINUTES AS NEEDED FOR CHEST PAIN MAY TAKE UP TO 3 DOSES.    Marland Kitchen potassium chloride SA (KLOR-CON M20) 20 MEQ tablet Take 1 tablet (20 mEq total) by mouth daily. 90 tablet 1  . Probiotic Product  (TRUNATURE DIGESTIVE PROBIOTIC) CAPS Take by mouth.    . rosuvastatin (CRESTOR) 10 MG tablet TAKE 1 TABLET BY MOUTH EVERY DAY 90 tablet 1  . spironolactone (ALDACTONE) 25 MG tablet Take 1 tablet by mouth daily.    . TURMERIC PO Take 2 capsules by mouth daily.    Marland Kitchen ALPRAZolam (XANAX) 0.25 MG tablet TAKE 1 TABLET BY MOUTH TWICE A DAY AS NEEDED FOR ANXIETY 60 tablet 0  . hydrochlorothiazide (HYDRODIURIL) 50 MG tablet Take 50 mg by mouth daily. (Patient not taking: Reported on 08/11/2020)    . S-Adenosylmethionine 200 MG TABS Take by mouth. (Patient not taking: No sig reported)     No facility-administered medications prior to visit.    Review of Systems;  Patient denies headache, fevers, malaise, unintentional weight loss, skin rash, eye pain, sinus congestion and sinus pain, sore throat, dysphagia,  hemoptysis , cough, dyspnea, wheezing, chest pain, palpitations, orthopnea, edema, abdominal pain, nausea, melena, diarrhea, constipation, flank pain, dysuria, hematuria, urinary  Frequency, nocturia, numbness, tingling, seizures,  Focal weakness, Loss of consciousness,  Tremor, insomnia, depression, anxiety, and suicidal ideation.      Objective:  BP 122/76 (BP Location: Right Arm, Patient Position: Sitting, Cuff Size: Large)   Pulse 96   Temp (!) 96.6 F (35.9 C) (Temporal)   Resp 15   Ht 5\' 7"  (1.702 m)   Wt 229 lb 3.2 oz (104 kg)   SpO2 96%   BMI 35.90 kg/m   BP Readings from Last 3 Encounters:  08/11/20 122/76  03/05/20 113/76  02/12/20 130/74    Wt Readings from Last 3 Encounters:  08/11/20 229 lb 3.2 oz (104 kg)  03/05/20 228 lb 3.2 oz (103.5 kg)  02/12/20 230 lb (104.3 kg)    General appearance: alert, cooperative and appears stated age Ears: normal TM's and external ear canals both ears Throat: lips, mucosa, and tongue normal; teeth and gums normal Neck: no adenopathy, no carotid bruit, supple, symmetrical, trachea midline and thyroid not enlarged, symmetric, no  tenderness/mass/nodules Back: symmetric, no curvature. ROM normal. No CVA tenderness. Lungs: clear to auscultation bilaterally Heart: regular rate and rhythm, S1, S2 normal, no murmur, click, rub or gallop Abdomen: soft, non-tender; bowel sounds normal; no masses,  no organomegaly Pulses: 2+ and symmetric Skin: Skin color, texture, turgor normal. No rashes or lesions Lymph nodes: Cervical, supraclavicular, and axillary nodes normal.  Lab Results  Component Value Date   HGBA1C 5.8 (H) 01/11/2020   HGBA1C 5.8 11/24/2017   HGBA1C 5.7 12/30/2016    Lab Results  Component Value Date   CREATININE 0.71 08/11/2020   CREATININE 0.77 03/28/2020   CREATININE 0.74 02/12/2020    Lab Results  Component Value Date   WBC 5.7 01/11/2020   HGB 13.5 01/11/2020   HCT 39.2 01/11/2020   PLT 243 01/11/2020   GLUCOSE 99 08/11/2020  CHOL 185 01/11/2020   TRIG 140 01/11/2020   HDL 57 01/11/2020   LDLDIRECT 146.4 01/11/2013   LDLCALC 104 (H) 01/11/2020   ALT 19 08/11/2020   AST 17 08/11/2020   NA 139 08/11/2020   K 4.6 08/11/2020   CL 105 08/11/2020   CREATININE 0.71 08/11/2020   BUN 18 08/11/2020   CO2 23 08/11/2020   TSH 1.38 01/16/2019   INR 1.0 11/24/2017   HGBA1C 5.8 (H) 01/11/2020    US ABDOMEN LIMITED RUQ (LIVER/GB)  Result Date: 03/17/2020 CLINICAL DATA:  Fatty liver. EXAM: ULTRASOUND ABDOMEN LIMITED RIGHT UPPER QUADRANT COMPARISON:  By ultrasound on 02/22/2013 and MRI of the abdomen on 03/05/2013 FINDINGS: Gallbladder: No gallstones or wall thickening visualized. No sonographic Murphy sign noted by sonographer. Common bile duct: Diameter: 6 mm Liver: The liver demonstrates coarse echotexture and increased echogenicity, likely reflecting diffuse steatosis. No overt cirrhotic contour abnormalities or focal lesions are identified. There is no evidence of intrahepatic biliary ductal dilatation. Portal vein is patent on color Doppler imaging with normal direction of blood flow towards  the liver. Other: No ascites identified. IMPRESSION: Evidence of hepatic steatosis by ultrasound without overt cirrhosis. No focal hepatic lesions identified. Electronically Signed   By: Aletta Edouard M.D.   On: 03/17/2020 08:46    Assessment & Plan:   Problem List Items Addressed This Visit      Unprioritized   Abnormal cardiac function test   Atrial fibrillation (HCC)   Relevant Medications   spironolactone (ALDACTONE) 25 MG tablet   Bronchiectasis (HCC)   Current mild episode of major depressive disorder without prior episode (HCC)   Relevant Medications   ALPRAZolam (XANAX) 0.25 MG tablet   Diuretic-induced hypokalemia    Resolved,  But currently taking 20 meq potassium AND spironolactone since last check  Potassium is stable. No changes today per patient preference  Lab Results  Component Value Date   NA 139 08/11/2020   K 4.6 08/11/2020   CL 105 08/11/2020   CO2 23 08/11/2020         Essential hypertension    Well controlled on current regimen of spironolatone. Metoprolol,   Renal function stable, no changes today.  Lab Results  Component Value Date   CREATININE 0.71 08/11/2020         Relevant Medications   spironolactone (ALDACTONE) 25 MG tablet   Hepatic steatosis - Primary    No treatment was offered by recent GI consult.  Given her obesity, we discussed a trial of metformin XR to reduce portion size via appetite reduction. XR prescribed to minimize diarrhea       Relevant Orders   AMB Referral to [facility] Coordinaton   Comprehensive metabolic panel (Completed)   Marital conflict    Discussed ways to manage conflict with husband primarily due to his lack of tidiness and organization.  Printed list of therapists given      Neuromuscular disorder ([place])   Relevant Medications   ALPRAZolam (XANAX) 0.25 MG tablet   Obesity (BMI 30-39.9)    With hepatic steatosis by noninvasive imaging.  Trial of metformin      Relevant Medications   metFORMIN  (GLUCOPHAGE-XR) 500 MG 24 hr tablet   Thoracic aortic atherosclerosis (HCC)    Tolerating Crestor daily without side effects.  LFTs are normal  Lab Results  Component Value Date   CHOL 185 01/11/2020   HDL 57 01/11/2020   LDLCALC 104 (H) 01/11/2020   LDLDIRECT 146.4 01/11/2013  TRIG 140 01/11/2020   CHOLHDL 3.2 01/11/2020   Lab Results  Component Value Date   ALT 19 08/11/2020   AST 17 08/11/2020   ALKPHOS 74 08/11/2020   BILITOT 0.4 08/11/2020           Relevant Medications   spironolactone (ALDACTONE) 25 MG tablet    Other Visit Diagnoses    Cancer ([place])   (Chronic)     Relevant Medications   ALPRAZolam (XANAX) 0.25 MG tablet     A total of 40 minutes was spent with patient more than half of which was spent in counseling patient on her obesity,  Fatty liver,  Joint pain and marital conflict , reviewing and explaining recent labs and imaging studies done, and coordination of care.  I have discontinued Livia E. Kardell's hydrochlorothiazide and S-Adenosylmethionine. I am also having her start on metFORMIN. Additionally, I am having her maintain her cetirizine, multivitamin, calcium-vitamin D, aspirin, metoprolol succinate, docusate sodium, nitroGLYCERIN, diltiazem, B Complex Vitamins (VITAMIN-B COMPLEX PO), Trunature Digestive Probiotic, Calcium Carbonate-Vit D-Min (CALCIUM 1200 PO), Multiple Vitamins-Minerals (HAIR SKIN AND NAILS FORMULA PO), Misc Natural Products (HEALTHY LIVER PO), Melatonin, TURMERIC PO, potassium chloride SA, buPROPion, rosuvastatin, spironolactone, and ALPRAZolam.  Meds ordered this encounter  Medications  . ALPRAZolam (XANAX) 0.25 MG tablet    Sig: TAKE 1 TABLET BY MOUTH TWICE A DAY AS NEEDED FOR ANXIETY    Dispense:  60 tablet    Refill:  1    Not to exceed 5 additional fills before 07/15/2020  . metFORMIN (GLUCOPHAGE-XR) 500 MG 24 hr tablet    Sig: Take 1 tablet (500 mg total) by mouth daily with breakfast.    Dispense:  30 tablet    Refill:  5     Medications Discontinued During This Encounter  Medication Reason  . hydrochlorothiazide (HYDRODIURIL) 50 MG tablet Change in therapy  . S-Adenosylmethionine 200 MG TABS   . ALPRAZolam (XANAX) 0.25 MG tablet Reorder    Follow-up: Return in about 3 months (around 11/11/2020).   Crecencio Mc, MD

## 2020-08-12 DIAGNOSIS — Z630 Problems in relationship with spouse or partner: Secondary | ICD-10-CM | POA: Insufficient documentation

## 2020-08-12 DIAGNOSIS — D039 Melanoma in situ, unspecified: Secondary | ICD-10-CM | POA: Insufficient documentation

## 2020-08-12 NOTE — Assessment & Plan Note (Signed)
Discussed ways to manage conflict with husband primarily due to his lack of tidiness and organization.  Printed list of therapists given

## 2020-08-12 NOTE — Assessment & Plan Note (Signed)
Tolerating Crestor daily without side effects.  LFTs are normal  Lab Results  Component Value Date   CHOL 185 01/11/2020   HDL 57 01/11/2020   LDLCALC 104 (H) 01/11/2020   LDLDIRECT 146.4 01/11/2013   TRIG 140 01/11/2020   CHOLHDL 3.2 01/11/2020   Lab Results  Component Value Date   ALT 19 08/11/2020   AST 17 08/11/2020   ALKPHOS 74 08/11/2020   BILITOT 0.4 08/11/2020

## 2020-08-12 NOTE — Assessment & Plan Note (Signed)
Well controlled on current regimen of spironolatone. Metoprolol,   Renal function stable, no changes today.  Lab Results  Component Value Date   CREATININE 0.71 08/11/2020

## 2020-08-12 NOTE — Assessment & Plan Note (Signed)
Per recent dermatology with biopsy.

## 2020-08-12 NOTE — Assessment & Plan Note (Signed)
With hepatic steatosis by noninvasive imaging.  Trial of metformin

## 2020-08-12 NOTE — Assessment & Plan Note (Signed)
No treatment was offered by recent GI consult.  Given her obesity, we discussed a trial of metformin XR to reduce portion size via appetite reduction. XR prescribed to minimize diarrhea

## 2020-08-12 NOTE — Assessment & Plan Note (Signed)
Resolved,  But currently taking 20 meq potassium AND spironolactone since last check  Potassium is stable. No changes today per patient preference  Lab Results  Component Value Date   NA 139 08/11/2020   K 4.6 08/11/2020   CL 105 08/11/2020   CO2 23 08/11/2020

## 2020-08-14 ENCOUNTER — Telehealth: Payer: Self-pay

## 2020-08-14 NOTE — Chronic Care Management (AMB) (Signed)
  Chronic Care Management   Outreach Note  08/14/2020 Name: Heidi Walsh MRN: [id number] DOB: [birth year]  Heidi Walsh is a 75 y.o. year old female who is a primary care patient of Heidi Walsh, Heidi Everts, Heidi Walsh. I reached out to Heidi Walsh by phone today in response to a referral sent by [name]'s PCP, Heidi Walsh, Heidi Walsh     An unsuccessful telephone outreach was attempted today. The patient was referred to the case management team for assistance with care management and care coordination.   Follow Up Plan: A HIPAA compliant phone message was left for the patient providing contact information and requesting a return call.  The care management team will reach out to the patient again over the next 7 days.  If patient returns call to provider office, please advise to call [facility] at [place], [place], [place], [place] [address] Direct Dial: [phone] [email or web address]

## 2020-08-19 NOTE — Chronic Care Management (AMB) (Signed)
  Chronic Care Management   Note  08/19/2020 Name: Heidi Walsh MRN: [id number] DOB: December 30, 1945  Heidi Walsh is a 75 y.o. year old female who is a primary care patient of Derrel Nip, Aris Everts, MD. I reached out to [name] by phone today in response to a referral sent by Heidi Walsh's PCP, Crecencio Mc, MD     Ms. Hiley was given information about Chronic Care Management services today including:  1. CCM service includes personalized support from designated clinical staff supervised by her physician, including individualized plan of care and coordination with other care providers 2. 24/7 contact phone numbers for assistance for urgent and routine care needs. 3. Service will only be billed when office clinical staff spend 20 minutes or more in a month to coordinate care. 4. Only one practitioner may furnish and bill the service in a calendar month. 5. The patient may stop CCM services at any time (effective at the end of the month) by phone call to the office staff. 6. The patient will be responsible for cost sharing (co-pay) of up to 20% of the service fee (after annual deductible is met).  Patient agreed to services and verbal consent obtained.   Follow up plan: Telephone appointment with care management team member scheduled for:08/26/2020  Noreene Larsson, [place], [place], [place] [address] Direct Dial: [phone] [email or web address]

## 2020-08-26 ENCOUNTER — Ambulatory Visit (INDEPENDENT_AMBULATORY_CARE_PROVIDER_SITE_OTHER): Payer: Medicare PPO | Admitting: Pharmacist

## 2020-08-26 DIAGNOSIS — K760 Fatty (change of) liver, not elsewhere classified: Secondary | ICD-10-CM

## 2020-08-26 DIAGNOSIS — E669 Obesity, unspecified: Secondary | ICD-10-CM

## 2020-08-26 DIAGNOSIS — F320 Major depressive disorder, single episode, mild: Secondary | ICD-10-CM | POA: Diagnosis not present

## 2020-08-26 DIAGNOSIS — I700 Atherosclerosis of aorta: Secondary | ICD-10-CM

## 2020-08-26 DIAGNOSIS — I4891 Unspecified atrial fibrillation: Secondary | ICD-10-CM | POA: Diagnosis not present

## 2020-08-26 DIAGNOSIS — R7301 Impaired fasting glucose: Secondary | ICD-10-CM

## 2020-08-26 IMAGING — DX DG CHEST 2V
2 series · 2 of 2 positions shown · non-contrast
Comparison: 12/23/2017.

CLINICAL DATA: Cough.  Night sweats.  Recurrent aspiration.

EXAM:
CHEST - 2 VIEW

[chest pa]
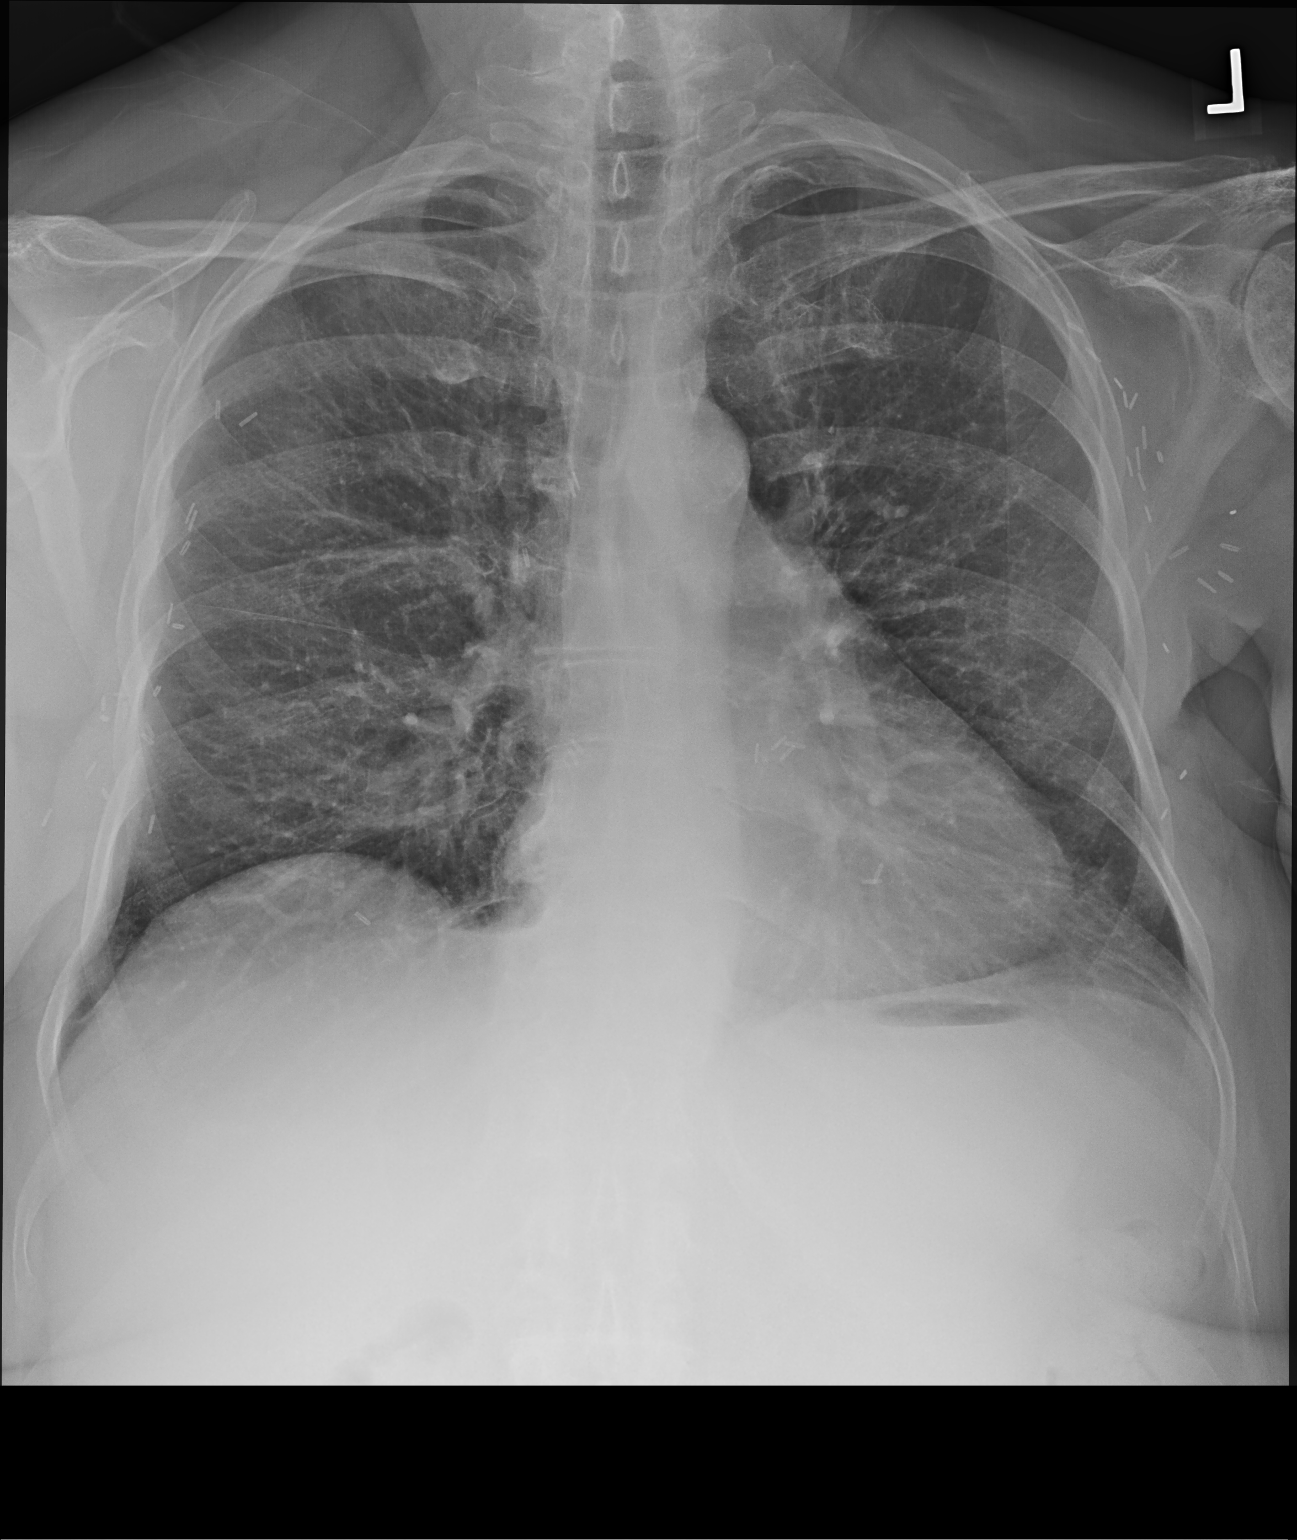

[chest lat]
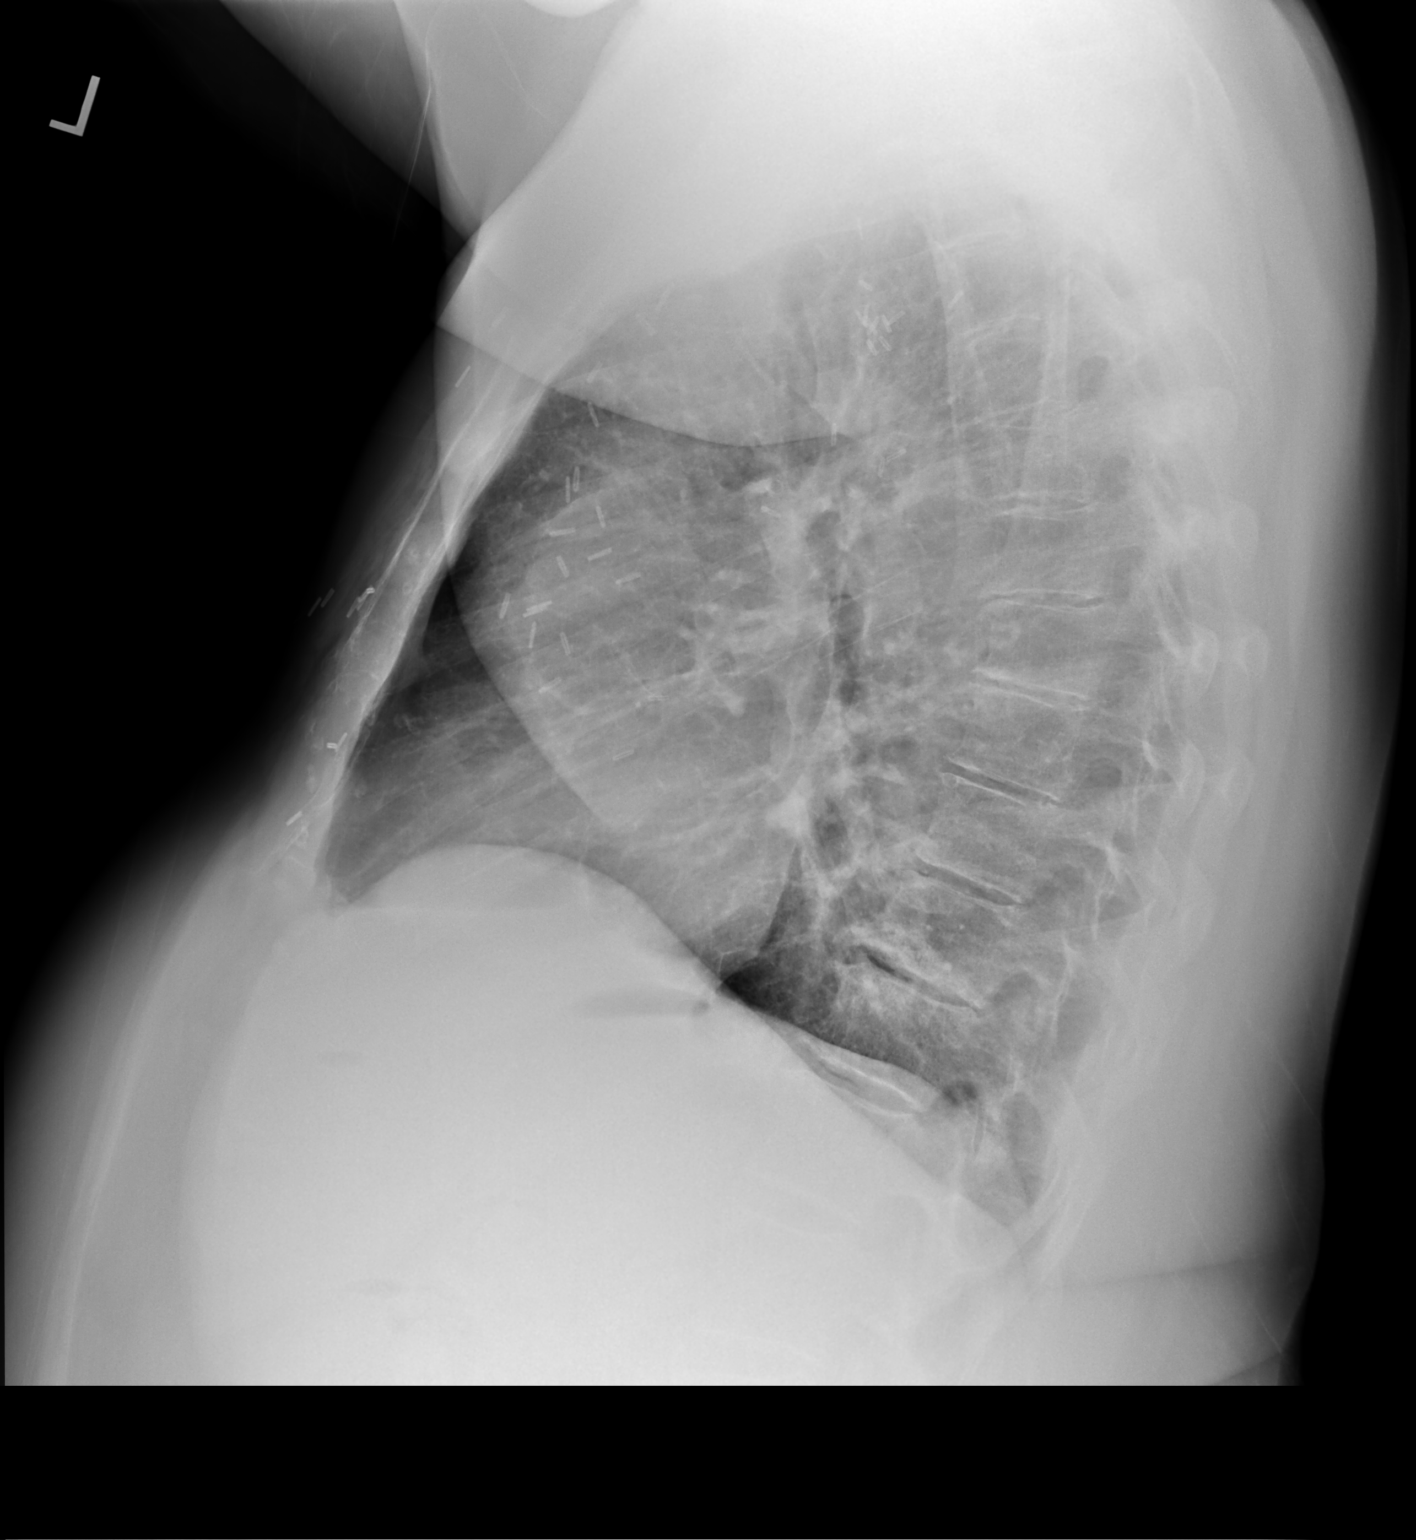

[2 of 2 positions shown; findings below may reference images not displayed]

FINDINGS: Mediastinum and hilar structures normal. Heart size stable. Normal
pulmonary vascularity.. No focal infiltrate. No pleural effusion or
pneumothorax. Surgical clips are noted over the chest.
IMPRESSION: No acute cardiopulmonary disease.  Stable cardiomegaly.

## 2020-08-26 MED ORDER — OZEMPIC (0.25 OR 0.5 MG/DOSE) 2 MG/1.5ML ~~LOC~~ SOPN: PEN_INJECTOR | SUBCUTANEOUS | 2 refills | Status: DC

## 2020-08-26 NOTE — Chronic Care Management (AMB) (Signed)
Chronic Care Management Pharmacy Note  08/26/2020 Name:  Heidi Walsh MRN:  [id number] DOB:  [birth year]   Subjective: Heidi Walsh is an 75 y.o. year old female who is a primary patient of Derrel Nip, Aris Everts, MD.  The CCM team was consulted for assistance with disease management and care coordination needs.    Engaged with patient by telephone for initial visit in response to provider referral for pharmacy case management and/or care coordination services.   Consent to Services:  The patient was given the following information about Chronic Care Management services today, agreed to services, and gave verbal consent: 1. CCM service includes personalized support from designated clinical staff supervised by the primary care provider, including individualized plan of care and coordination with other care providers 2. 24/7 contact phone numbers for assistance for urgent and routine care needs. 3. Service will only be billed when office clinical staff spend 20 minutes or more in a month to coordinate care. 4. Only one practitioner may furnish and bill the service in a calendar month. 5.The patient may stop CCM services at any time (effective at the end of the month) by phone call to the office staff. 6. The patient will be responsible for cost sharing (co-pay) of up to 20% of the service fee (after annual deductible is met). Patient agreed to services and consent obtained.  Patient Care Team: Crecencio Mc, MD as PCP - General (Internal Medicine) De Hollingshead, RPH-CPP (Pharmacist)  Recent office visits: 5/23 - PCP visit, start metformin and consider GLP1 for weight management  Objective:  Lab Results  Component Value Date   CREATININE 0.71 08/11/2020   CREATININE 0.77 03/28/2020   CREATININE 0.74 02/12/2020    Lab Results  Component Value Date   HGBA1C 5.8 (H) 01/11/2020   Last diabetic Eye exam: No results found for: HMDIABEYEEXA  Last diabetic Foot exam: No results  found for: HMDIABFOOTEX      Component Value Date/Time   CHOL 185 01/11/2020 1620   TRIG 140 01/11/2020 1620   HDL 57 01/11/2020 1620   CHOLHDL 3.2 01/11/2020 1620   VLDL 27.6 11/06/2014 0948   LDLCALC 104 (H) 01/11/2020 1620   LDLDIRECT 146.4 01/11/2013 0911    Hepatic Function Latest Ref Rng & Units 08/11/2020 03/28/2020 02/12/2020  Total Protein 6.0 - 8.3 g/dL 7.3 7.1 7.2  Albumin 3.5 - 5.2 g/dL 4.6 4.3 4.5  AST 0 - 37 U/L $Remo'17 24 20  'HfbPu$ ALT 0 - 35 U/L $Remo'19 22 19  'zKUad$ Alk Phosphatase 39 - 117 U/L 74 79 63  Total Bilirubin 0.2 - 1.2 mg/dL 0.4 0.3 0.3  Bilirubin, Direct 0.0 - 0.3 mg/dL - - -    Lab Results  Component Value Date/Time   TSH 1.38 01/16/2019 02:26 PM   TSH 2.30 11/24/2017 12:40 PM    CBC Latest Ref Rng & Units 01/11/2020 01/16/2019 05/19/2018  WBC 3.8 - 10.8 Thousand/uL 5.7 5.8 5.4  Hemoglobin 11.7 - 15.5 g/dL 13.5 14.8 13.7  Hematocrit 35.0 - 45.0 % 39.2 43.3 38.6  Platelets 140 - 400 Thousand/uL 243 219.0 267    Lab Results  Component Value Date/Time   VD25OH 42 01/11/2020 04:20 PM   VD25OH 32.44 11/24/2017 12:40 PM   VD25OH 33.39 11/06/2014 09:48 AM    Clinical ASCVD: Yes - aortic atherosclerosis  The 10-year ASCVD risk score Mikey Bussing DC Jr., et al., 2013) is: 13.9%   Values used to calculate the score:     Age:  79 years     Sex: Female     Is Non-Hispanic African American: No     Diabetic: No     Tobacco smoker: No     Systolic Blood Pressure: 967 mmHg     Is BP treated: Yes     HDL Cholesterol: 57 mg/dL     Total Cholesterol: 185 mg/dL      Social History   Tobacco Use  Smoking Status Never Smoker  Smokeless Tobacco Never Used   BP Readings from Last 3 Encounters:  08/11/20 122/76  03/05/20 113/76  02/12/20 130/74   Pulse Readings from Last 3 Encounters:  08/11/20 96  03/05/20 84  02/12/20 87   Wt Readings from Last 3 Encounters:  08/11/20 229 lb 3.2 oz (104 kg)  03/05/20 228 lb 3.2 oz (103.5 kg)  02/12/20 230 lb (104.3 kg)     Assessment: Review of patient past medical history, allergies, medications, health status, including review of consultants reports, laboratory and other test data, was performed as part of comprehensive evaluation and provision of chronic care management services.   SDOH:  (Social Determinants of Health) assessments and interventions performed:  SDOH Interventions   Flowsheet Row Most Recent Value  SDOH Interventions   Financial Strain Interventions Intervention Not Indicated      CCM Care Plan  Allergies  Allergen Reactions  . Oxycodone Nausea And Vomiting  . Penicillins Anaphylaxis  . Ancef [Cefazolin] Swelling  . Cephalexin Rash  . Rivaroxaban Nausea Only    Medications Reviewed Today    Reviewed by De Hollingshead, RPH-CPP (Pharmacist) on 08/26/20 at 1523  Med List Status: <None>  Medication Order Taking? Sig Documenting Provider Last Dose Status Informant  ALPRAZolam (XANAX) 0.25 MG tablet [id number] Yes TAKE 1 TABLET BY MOUTH TWICE A DAY AS NEEDED FOR ANXIETY Crecencio Mc, MD Taking Active            Med Note De Hollingshead   Tue Aug 26, 2020  3:11 PM) Taking 1/4 tablet QPM PRN   aspirin 81 MG tablet [id number] Yes Take 81 mg by mouth 2 (two) times daily. [provider] Taking Active   B Complex Vitamins (VITAMIN-B COMPLEX PO) [id number] Yes Take 1 tablet by mouth daily. [provider] Taking Active   buPROPion [facility] SR) 200 MG 12 hr tablet [id number] Yes TAKE 1 TABLET BY MOUTH TWICE A DAY Crecencio Mc, MD Taking Active            Med Note De Hollingshead   Tue Aug 26, 2020  3:14 PM) Taking 1 QAM  Calcium Carbonate-Vit D-Min (CALCIUM 1200 PO) [id number] Yes Take 1 capsule by mouth daily. [provider] Taking Active   cetirizine (ZYRTEC) 10 MG tablet [id number] Yes Take 10 mg by mouth daily. [provider] Taking Active   diltiazem (CARDIZEM) 30 MG tablet [id number] Yes Take 30 mg by mouth as needed.  [provider] Taking Active            Med Note ([place] Aug 26, 2020  3:17 PM) Using ~ once monthly for symptoms of afib  docusate sodium (COLACE) 100 MG capsule [id number] Yes Take 100 mg by mouth 2 (two) times daily. [provider] Taking Active   HOMEOPATHIC PRODUCTS PO [id number] Yes Take 1 tablet by mouth daily. NAD Supplement [provider] Taking Active   Melatonin 10 MG CAPS [id number] Yes Take 1 capsule by mouth daily. [provider] Taking Active   metFORMIN (GLUCOPHAGE-XR) 500 MG 24 hr tablet [id number] Yes Take 1 tablet (500 mg total) by mouth daily with breakfast. Crecencio Mc, MD Taking Active   metoprolol succinate (TOPROL-XL) 25 MG 24 hr tablet [id number] Yes Take 25 mg by mouth daily. [provider] Taking Active   Multiple Vitamin (MULTIVITAMIN) tablet [id number] Yes Take 1 tablet by mouth daily. [provider] Taking Active   Multiple Vitamins-Minerals (HAIR SKIN AND NAILS FORMULA PO) [id number] Yes Take 3 tablets by mouth daily. [provider] Taking Active   nitroGLYCERIN (NITROSTAT) 0.4 MG SL tablet [id number] No PLACE 1 TABLET UNDER THE TONGUE EVERY 5 MINUTES AS NEEDED FOR CHEST PAIN MAY TAKE UP TO 3 DOSES.  Patient not taking: Reported on 08/26/2020   [provider] Not Taking Active   potassium chloride SA (KLOR-CON M20) 20 MEQ tablet [id number] Yes Take 1 tablet (20 mEq total) by mouth daily. Crecencio Mc, MD Taking Active   Probiotic Product [facility] DIGESTIVE PROBIOTIC) CAPS [id number] Yes Take by mouth. [provider] Taking Active   rosuvastatin (CRESTOR) 10 MG tablet [id number] Yes TAKE 1 TABLET BY MOUTH EVERY DAY Crecencio Mc, MD Taking Active   spironolactone (ALDACTONE) 25 MG tablet [id number] Yes Take 1 tablet by mouth daily. [provider] Taking Active   TURMERIC PO [id number] Yes Take 2 capsules by mouth daily. [provider] Taking Active            Patient Active Problem List   Diagnosis Date Noted  . Marital conflict 63/89/3734  . Melanoma in situ ([place]) 08/12/2020  . Abnormal cardiac function test 08/11/2020  . Edema 03/05/2020  . History of total hip arthroplasty 03/05/2020  . Knee stiff 03/05/2020  . Pain in limb 03/05/2020  . Cervical cancer screening 02/13/2020  . Encounter for preventive health examination 01/13/2020  . Thoracic aortic atherosclerosis ([place]) 01/13/2020  . Chest pain 01/16/2019  . Diuretic-induced hypokalemia 01/16/2019  . Bronchiectasis ([place]) 05/20/2018  . Esophageal dysmotility 05/09/2018  . Multiple pulmonary nodules determined by computed tomography of lung 03/01/2018  . Disorder of bursae of shoulder region 02/21/2018  . History of radiofrequency ablation (RFA) procedure for cardiac arrhythmia 02/21/2018  . Muscle weakness 02/21/2018  . Osteoarthritis of knee 02/21/2018  . Osteoarthritis of right hip 02/21/2018  . Primary localized osteoarthritis of pelvic region and thigh 11/26/2017  . Osteopenia 11/18/2016  . Constipation, chronic 09/03/2015  . Current mild episode of major depressive disorder without prior episode ([place]) 08/03/2015  . Lymphedema 08/03/2015  . SOB (shortness of breath) 07/15/2015  . Malignant neoplasm of left female breast ([place]) 07/02/2015  . Preoperative evaluation of a medical condition to rule out surgical contraindications (TAR required) 02/14/2014  . Medicare annual wellness visit, subsequent 09/23/2013  . Hepatic steatosis 03/25/2013  . S/P bilateral mastectomy 02/27/2013  . Acquired absence of both breasts and nipples 02/27/2013  . Mechanical ptosis 04/21/2012  . Obesity (BMI 30-39.9) 11/17/2011  . 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 Sleep apnea 11/17/2011  . Atrial fibrillation ([place]) 11/17/2011  . History of breast cancer 11/17/2011  . Presence of left artificial knee joint 11/17/2011  . Neuromuscular disorder ([place]) 10/18/2011  . Essential  hypertension 08/12/2011  . 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 Lymphedema of left arm 04/22/2008    Immunization History  Administered Date(s) Administered  . Fluad Quad(high Dose 65+) 01/15/2019  . Influenza Split 02/23/2011, 02/20/2012  . Influenza Whole 02/04/2016  .  Influenza, High Dose Seasonal PF 01/01/2015, 02/06/2020  . Influenza,inj,Quad PF,6+ Mos 12/25/2012  . Influenza,inj,quad, With Preservative 12/20/2016, 12/20/2017  . Influenza-Unspecified 12/16/2016, 01/03/2018  . Moderna Sars-Covid-2 Vaccination 05/02/2019, 05/30/2019, 01/14/2020  . PPD Test 04/25/2019  . Pneumococcal Conjugate-13 09/20/2013  . Pneumococcal Polysaccharide-23 03/19/2014  . Tdap 11/24/2006, 12/27/2013  . Zoster, Live 12/26/2007    Conditions to be addressed/monitored: Atrial Fibrillation, CAD and HTN  Care Plan : Medication Management  Updates made by De Hollingshead, RPH-CPP since 08/26/2020 12:00 AM    Problem: Obesity, Fatty Liver Disease     Long-Range Goal: Disease Progression Progression   Start Date: 08/26/2020  This Visit's Progress: On track  Priority: High  Note:   Current Barriers:  . Unable to achieve control of weight   Pharmacist Clinical Goal(s):  Marland Kitchen Over the next 90 days, patient will achieve control of weight through collaboration with PharmD and provider.   Interventions: . 1:1 collaboration with Crecencio Mc, MD regarding development and update of comprehensive plan of care as evidenced by provider attestation and co-signature . Inter-disciplinary care team collaboration (see longitudinal plan of care) . Comprehensive medication review performed; medication list updated in electronic medical record  Obesity, Impaired Fasting Glucose . Unable to achieve goal weight loss through diet and exercise alone; current treatment: metformin XR 500 mg daily;  . Current meal patterns: focus on fresh vegetables, fish. Sometimes desserts, but tries to minimize . Current  exercise: daily, exercise bike, rowing machine, sometimes hand weights . Counseled on GLP1 agonists, including mechanism of action, side effects, and benefits. No personal or family history of medullary thyroid cancer, personal history of pancreatitis or gallbladder disease. Counseled on potential side effects of nausea, stomach upset, queasiness, constipation, and that these generally improve over time. Advised to contact our office with more severe symptoms, including nausea, diarrhea, stomach pain. Patient verbalized understanding. Discussed that Medicare does not cover weight management drugs like Wegovy, but that we could investigate coverage for off-label Ozempic. Patient amenable. Script sent to PCP for cosign if in agreement. Will follow up on coverage and outreach patient to schedule nurse visit for administration teaching.   Hx Atrial Fibrillation: . Controlled; current treatment: rate control: metoprolol succinate 25 mg daily, anticoagulant therapy: none, though on aspirin 81 mg BID. In NSR per cardiology; diltiazem 30 mg PRN for symptoms of breakthrough palpitations, but notes only needing to take about once per month; additional antihypertensive: spironolactone 25 mg daily  . Continue current regimen at this time. Continue to evaluate VTE risk given hx Afib.   Hyperlipidemia: . Uncontrolled per last lipid panel but has not been checked since initiation of statin therapy; current treatment: rosuvastatin 10 mg daily . Antiplatelet regimen: aspirin 81 mg BID - taking also for arthritis benefit  . Continue current regimen at this time. Recommend lipid panel with next lab work  Anxiety/Insomnia: . Controlled per patient report; current regimen: alprazolam 0.25 mg (1/4 of tablet), melatonin 10 mg, bupropion SR 200 mg QAM - not taking second dose later in the day . Reports no concerns falling asleep or staying asleep.  . Reports a regular bedtime and no screen time before bed.    Supplements: Marland Kitchen MVI, biotin, probiotic, turmeric, docusate 100 mg BID, cetirizine 10 mg daily, calcium + vitamin D, b complex . Discussed risk of bleeds with turmeric in combination with aspirin. Patient verbalizes understanding and plans to discontinue.   Patient Goals/Self-Care Activities . Over the next 90 days, patient will:  -  take medications as prescribed collaborate with provider on medication access solutions target a minimum of 150 minutes of moderate intensity exercise weekly engage in dietary modifications by reducing carbohydrate portion sizes  Follow Up Plan: Telephone follow up appointment with care management team member scheduled for: pending medication access      Medication Assistance: None required.  Patient affirms current coverage meets needs.  Patient's preferred pharmacy is:  [id number] - [place], [place] [address] Phone: [phone] Fax: [phone]  Follow Up:  Patient agrees to Care Plan and Follow-up.  Plan: Telephone follow up appointment with care management team member scheduled for:  ~ pending medication access plan  Catie Darnelle Maffucci, PharmD, [place], [place] Clinical Pharmacist [employer] at [address] [phone]

## 2020-08-26 NOTE — Patient Instructions (Signed)
Visit Information   PATIENT GOALS:  Goals Addressed              This Visit's Progress     Patient Stated   .  Medication Management (pt-stated)        Patient Goals/Self-Care Activities . Over the next 90 days, patient will:  - take medications as prescribed collaborate with provider on medication access solutions target a minimum of 150 minutes of moderate intensity exercise weekly engage in dietary modifications by reducing carbohydrate portion sizes       Consent to CCM Services: Heidi Walsh was given information about Chronic Care Management services today including:  1. CCM service includes personalized support from designated clinical staff supervised by her physician, including individualized plan of care and coordination with other care providers 2. 24/7 contact phone numbers for assistance for urgent and routine care needs. 3. Service will only be billed when office clinical staff spend 20 minutes or more in a month to coordinate care. 4. Only one practitioner may furnish and bill the service in a calendar month. 5. The patient may stop CCM services at any time (effective at the end of the month) by phone call to the office staff. 6. The patient will be responsible for cost sharing (co-pay) of up to 20% of the service fee (after annual deductible is met).  Patient agreed to services and verbal consent obtained.   Patient verbalizes understanding of instructions provided today and agrees to view in [place].   Plan: Telephone follow up appointment with care management team member scheduled for:  ~ pending medication access plan  Catie Darnelle Maffucci, PharmD, BCACP, CPP Clinical Pharmacist [place] at [facility] [place]: Patient Care Plan: Medication Management    Problem Identified: Obesity, Fatty Liver Disease     Long-Range Goal: Disease Progression Progression   Start Date: 08/26/2020  This Visit's Progress: On track   Priority: High  Note:   Current Barriers:  . Unable to achieve control of weight   Pharmacist Clinical Goal(s):  Marland Kitchen Over the next 90 days, patient will achieve control of weight through collaboration with PharmD and provider.   Interventions: . 1:1 collaboration with Crecencio Mc, MD regarding development and update of comprehensive plan of care as evidenced by provider attestation and co-signature . Inter-disciplinary care team collaboration (see longitudinal plan of care) . Comprehensive medication review performed; medication list updated in electronic medical record  Obesity, Impaired Fasting Glucose . Unable to achieve goal weight loss through diet and exercise alone; current treatment: metformin XR 500 mg daily;  . Current meal patterns: focus on fresh vegetables, fish. Sometimes desserts, but tries to minimize . Current exercise: daily, exercise bike, rowing machine, sometimes hand weights . Counseled on GLP1 agonists, including mechanism of action, side effects, and benefits. No personal or family history of medullary thyroid cancer, personal history of pancreatitis or gallbladder disease. Counseled on potential side effects of nausea, stomach upset, queasiness, constipation, and that these generally improve over time. Advised to contact our office with more severe symptoms, including nausea, diarrhea, stomach pain. Patient verbalized understanding. Discussed that Medicare does not cover weight management drugs like Wegovy, but that we could investigate coverage for off-label Ozempic. Patient amenable. Script sent to PCP for cosign if in agreement. Will follow up on coverage and outreach patient to schedule nurse visit for administration teaching.   Hx Atrial Fibrillation: . Controlled; current treatment: rate control: metoprolol succinate 25 mg daily, anticoagulant therapy: none, though on  aspirin 81 mg BID. In NSR per cardiology; diltiazem 30 mg PRN for symptoms of breakthrough  palpitations, but notes only needing to take about once per month; additional antihypertensive: spironolactone 25 mg daily  . Continue current regimen at this time. Continue to evaluate VTE risk given hx Afib.   Hyperlipidemia: . Uncontrolled per last lipid panel but has not been checked since initiation of statin therapy; current treatment: rosuvastatin 10 mg daily . Antiplatelet regimen: aspirin 81 mg BID - taking also for arthritis benefit  . Continue current regimen at this time. Recommend lipid panel with next lab work  Anxiety/Insomnia: . Controlled per patient report; current regimen: alprazolam 0.25 mg (1/4 of tablet), melatonin 10 mg, bupropion SR 200 mg QAM - not taking second dose later in the day . Reports no concerns falling asleep or staying asleep.  . Reports a regular bedtime and no screen time before bed.   Supplements: Marland Kitchen MVI, biotin, probiotic, turmeric, docusate 100 mg BID, cetirizine 10 mg daily, calcium + vitamin D, b complex . Discussed risk of bleeds with turmeric in combination with aspirin. Patient verbalizes understanding and plans to discontinue.   Patient Goals/Self-Care Activities . Over the next 90 days, patient will:  - take medications as prescribed collaborate with provider on medication access solutions target a minimum of 150 minutes of moderate intensity exercise weekly engage in dietary modifications by reducing carbohydrate portion sizes  Follow Up Plan: Telephone follow up appointment with care management team member scheduled for: pending medication access

## 2020-09-05 ENCOUNTER — Ambulatory Visit (INDEPENDENT_AMBULATORY_CARE_PROVIDER_SITE_OTHER): Payer: Medicare PPO

## 2020-09-05 ENCOUNTER — Other Ambulatory Visit: Payer: Self-pay

## 2020-09-05 DIAGNOSIS — E669 Obesity, unspecified: Secondary | ICD-10-CM

## 2020-09-05 NOTE — Progress Notes (Signed)
Patient presented for [facility] teaching. Per Providers order from 08-26-20. Patient voiced no concerns nor showed signs of distress during injection. Patient was instructed on how to give herself the Ozempic injection. Instructed patient that if further questions, they can call clinic to be given further direction.

## 2020-09-08 ENCOUNTER — Telehealth: Payer: Self-pay

## 2020-09-08 DIAGNOSIS — K760 Fatty (change of) liver, not elsewhere classified: Secondary | ICD-10-CM

## 2020-09-08 NOTE — Telephone Encounter (Signed)
-----   Message from [place], [place] sent at 03/17/2020  1:54 PM EST ----- Pt needs repeat LFT's.

## 2020-09-08 NOTE — Telephone Encounter (Signed)
Left a detail message for patient and order the LFTS.  Sent mychart message

## 2020-09-15 DIAGNOSIS — G4733 Obstructive sleep apnea (adult) (pediatric): Secondary | ICD-10-CM | POA: Diagnosis not present

## 2020-09-16 DIAGNOSIS — G8929 Other chronic pain: Secondary | ICD-10-CM | POA: Diagnosis not present

## 2020-09-16 DIAGNOSIS — M47816 Spondylosis without myelopathy or radiculopathy, lumbar region: Secondary | ICD-10-CM | POA: Diagnosis not present

## 2020-09-16 DIAGNOSIS — Z880 Allergy status to penicillin: Secondary | ICD-10-CM | POA: Diagnosis not present

## 2020-09-16 DIAGNOSIS — I4891 Unspecified atrial fibrillation: Secondary | ICD-10-CM | POA: Diagnosis not present

## 2020-09-16 DIAGNOSIS — E669 Obesity, unspecified: Secondary | ICD-10-CM | POA: Diagnosis not present

## 2020-09-16 DIAGNOSIS — M545 Low back pain, unspecified: Secondary | ICD-10-CM | POA: Diagnosis not present

## 2020-09-16 DIAGNOSIS — M5459 Other low back pain: Secondary | ICD-10-CM | POA: Diagnosis not present

## 2020-09-16 DIAGNOSIS — I10 Essential (primary) hypertension: Secondary | ICD-10-CM | POA: Diagnosis not present

## 2020-09-16 DIAGNOSIS — Z6835 Body mass index (BMI) 35.0-35.9, adult: Secondary | ICD-10-CM | POA: Diagnosis not present

## 2020-09-16 DIAGNOSIS — Z885 Allergy status to narcotic agent status: Secondary | ICD-10-CM | POA: Diagnosis not present

## 2020-09-17 DIAGNOSIS — G8929 Other chronic pain: Secondary | ICD-10-CM | POA: Diagnosis not present

## 2020-09-17 DIAGNOSIS — M5459 Other low back pain: Secondary | ICD-10-CM | POA: Diagnosis not present

## 2020-09-17 DIAGNOSIS — M545 Low back pain, unspecified: Secondary | ICD-10-CM | POA: Diagnosis not present

## 2020-09-25 DIAGNOSIS — G8929 Other chronic pain: Secondary | ICD-10-CM | POA: Diagnosis not present

## 2020-09-25 DIAGNOSIS — M545 Low back pain, unspecified: Secondary | ICD-10-CM | POA: Diagnosis not present

## 2020-09-26 DIAGNOSIS — I480 Paroxysmal atrial fibrillation: Secondary | ICD-10-CM | POA: Diagnosis not present

## 2020-09-26 DIAGNOSIS — R0602 Shortness of breath: Secondary | ICD-10-CM | POA: Diagnosis not present

## 2020-09-26 DIAGNOSIS — R0789 Other chest pain: Secondary | ICD-10-CM | POA: Diagnosis not present

## 2020-09-26 DIAGNOSIS — I10 Essential (primary) hypertension: Secondary | ICD-10-CM | POA: Diagnosis not present

## 2020-09-26 DIAGNOSIS — G4733 Obstructive sleep apnea (adult) (pediatric): Secondary | ICD-10-CM | POA: Diagnosis not present

## 2020-10-02 DIAGNOSIS — K760 Fatty (change of) liver, not elsewhere classified: Secondary | ICD-10-CM

## 2020-10-02 DIAGNOSIS — G8929 Other chronic pain: Secondary | ICD-10-CM | POA: Diagnosis not present

## 2020-10-02 DIAGNOSIS — M545 Low back pain, unspecified: Secondary | ICD-10-CM | POA: Diagnosis not present

## 2020-10-02 DIAGNOSIS — M5459 Other low back pain: Secondary | ICD-10-CM | POA: Diagnosis not present

## 2020-10-04 ENCOUNTER — Other Ambulatory Visit: Payer: Self-pay | Admitting: Internal Medicine

## 2020-10-06 ENCOUNTER — Other Ambulatory Visit: Payer: Self-pay

## 2020-10-06 ENCOUNTER — Other Ambulatory Visit (INDEPENDENT_AMBULATORY_CARE_PROVIDER_SITE_OTHER): Payer: Medicare PPO

## 2020-10-06 DIAGNOSIS — K760 Fatty (change of) liver, not elsewhere classified: Secondary | ICD-10-CM | POA: Diagnosis not present

## 2020-10-07 DIAGNOSIS — M545 Low back pain, unspecified: Secondary | ICD-10-CM | POA: Diagnosis not present

## 2020-10-07 DIAGNOSIS — M5459 Other low back pain: Secondary | ICD-10-CM | POA: Diagnosis not present

## 2020-10-07 DIAGNOSIS — G8929 Other chronic pain: Secondary | ICD-10-CM | POA: Diagnosis not present

## 2020-10-07 LAB — HEPATIC FUNCTION PANEL
ALT: 20 IU/L (ref 0–32)
AST: 42 IU/L — ABNORMAL HIGH (ref 0–40)
Albumin: 4.8 g/dL — ABNORMAL HIGH (ref 3.7–4.7)
Alkaline Phosphatase: 88 IU/L (ref 44–121)
Bilirubin Total: 0.4 mg/dL (ref 0.0–1.2)
Bilirubin, Direct: <0.1 mg/dL (ref 0.00–0.40)
Total Protein: 7.4 g/dL (ref 6.0–8.5)

## 2020-10-16 ENCOUNTER — Telehealth: Payer: Medicare PPO

## 2020-10-20 ENCOUNTER — Ambulatory Visit (INDEPENDENT_AMBULATORY_CARE_PROVIDER_SITE_OTHER): Payer: Medicare PPO | Admitting: Pharmacist

## 2020-10-20 DIAGNOSIS — F419 Anxiety disorder, unspecified: Secondary | ICD-10-CM

## 2020-10-20 DIAGNOSIS — Z6830 Body mass index (BMI) 30.0-30.9, adult: Secondary | ICD-10-CM | POA: Diagnosis not present

## 2020-10-20 DIAGNOSIS — E669 Obesity, unspecified: Secondary | ICD-10-CM

## 2020-10-20 DIAGNOSIS — G4700 Insomnia, unspecified: Secondary | ICD-10-CM

## 2020-10-20 DIAGNOSIS — I4891 Unspecified atrial fibrillation: Secondary | ICD-10-CM | POA: Diagnosis not present

## 2020-10-20 DIAGNOSIS — R7301 Impaired fasting glucose: Secondary | ICD-10-CM

## 2020-10-20 DIAGNOSIS — E785 Hyperlipidemia, unspecified: Secondary | ICD-10-CM | POA: Diagnosis not present

## 2020-10-20 DIAGNOSIS — Z8679 Personal history of other diseases of the circulatory system: Secondary | ICD-10-CM

## 2020-10-20 DIAGNOSIS — K760 Fatty (change of) liver, not elsewhere classified: Secondary | ICD-10-CM

## 2020-10-20 NOTE — Chronic Care Management (AMB) (Signed)
Chronic Care Management Pharmacy Note  10/20/2020 Name:  Heidi Walsh MRN:  [id number] DOB:  May 08, 1945   Subjective: Heidi Walsh is an 75 y.o. year old female who is a primary patient of Derrel Nip, Aris Everts, MD.  The CCM team was consulted for assistance with disease management and care coordination needs.    Engaged with patient by telephone for follow up visit in response to provider referral for pharmacy case management and/or care coordination services.   Consent to Services:  The patient was given information about Chronic Care Management services, agreed to services, and gave verbal consent prior to initiation of services.  Please see initial visit note for detailed documentation.   Patient Care Team: Crecencio Mc, MD as PCP - General (Internal Medicine) De Hollingshead, RPH-CPP (Pharmacist)  Objective:  Lab Results  Component Value Date   CREATININE 0.71 08/11/2020   CREATININE 0.77 03/28/2020   CREATININE 0.74 02/12/2020    Lab Results  Component Value Date   HGBA1C 5.8 (H) 01/11/2020   Last diabetic Eye exam: No results found for: HMDIABEYEEXA  Last diabetic Foot exam: No results found for: HMDIABFOOTEX      Component Value Date/Time   CHOL 185 01/11/2020 1620   TRIG 140 01/11/2020 1620   HDL 57 01/11/2020 1620   CHOLHDL 3.2 01/11/2020 1620   VLDL 27.6 11/06/2014 0948   LDLCALC 104 (H) 01/11/2020 1620   LDLDIRECT 146.4 01/11/2013 0911    Hepatic Function Latest Ref Rng & Units 10/06/2020 08/11/2020 03/28/2020  Total Protein 6.0 - 8.5 g/dL 7.4 7.3 7.1  Albumin 3.7 - 4.7 g/dL 4.8(H) 4.6 4.3  AST 0 - 40 IU/L 42(H) 17 24  ALT 0 - 32 IU/L _0 Alk Phosphatase 44 - 121 IU/L 88 74 79  Total Bilirubin 0.0 - 1.2 mg/dL 0.4 0.4 0.3  Bilirubin, Direct 0.00 - 0.40 mg/dL <0.10 - -    Lab Results  Component Value Date/Time   TSH 1.38 01/16/2019 02:26 PM   TSH 2.30 11/24/2017 12:40 PM    CBC Latest Ref Rng & Units 01/11/2020 01/16/2019  05/19/2018  WBC 3.8 - 10.8 Thousand/uL 5.7 5.8 5.4  Hemoglobin 11.7 - 15.5 g/dL 13.5 14.8 13.7  Hematocrit 35.0 - 45.0 % 39.2 43.3 38.6  Platelets 140 - 400 Thousand/uL 243 219.0 267    Lab Results  Component Value Date/Time   VD25OH 42 01/11/2020 04:20 PM   VD25OH 32.44 11/24/2017 12:40 PM   VD25OH 33.39 11/06/2014 09:48 AM    Clinical ASCVD: No  The 10-year ASCVD risk score Mikey Bussing DC Jr., et al., 2013) is: 15%   Values used to calculate the score:     Age: 75 years     Sex: Female     Is Non-Hispanic African American: No     Diabetic: No     Tobacco smoker: No     Systolic Blood Pressure: 081 mmHg     Is BP treated: Yes     HDL Cholesterol: 57 mg/dL     Total Cholesterol: 185 mg/dL   CHA2DS2-VASc Score = 3  This indicates a 3.2% annual risk of stroke. The patient's score is based upon: CHF History: No HTN History: Yes Diabetes History: No Stroke History: No Vascular Disease History: No Age Score: 1 Gender Score: 1    Social History   Tobacco Use  Smoking Status Never  Smokeless Tobacco Never   BP Readings from Last 3 Encounters:  08/11/20 122/76  03/05/20 113/76  02/12/20 130/74   Pulse Readings from Last 3 Encounters:  08/11/20 96  03/05/20 84  02/12/20 87   Wt Readings from Last 3 Encounters:  08/11/20 229 lb 3.2 oz (104 kg)  03/05/20 228 lb 3.2 oz (103.5 kg)  02/12/20 230 lb (104.3 kg)    Assessment: Review of patient past medical history, allergies, medications, health status, including review of consultants reports, laboratory and other test data, was performed as part of comprehensive evaluation and provision of chronic care management services.   SDOH:  (Social Determinants of Health) assessments and interventions performed:  SDOH Interventions    Flowsheet Row Most Recent Value  SDOH Interventions   Financial Strain Interventions Intervention Not Indicated       CCM Care Plan  Allergies  Allergen Reactions   Oxycodone Nausea And  Vomiting   Penicillins Anaphylaxis   Ancef [Cefazolin] Swelling   Cephalexin Rash   Rivaroxaban Nausea Only    Medications Reviewed Today     Reviewed by De Hollingshead, RPH-CPP (Pharmacist) on 08/26/20 at 47  Med List Status: <None>   Medication Order Taking? Sig Documenting Provider Last Dose Status Informant  ALPRAZolam (XANAX) 0.25 MG tablet [id number] Yes TAKE 1 TABLET BY MOUTH TWICE A DAY AS NEEDED FOR ANXIETY Crecencio Mc, MD Taking Active            Med Note De Hollingshead   Tue Aug 26, 2020  3:11 PM) Taking 1/4 tablet QPM PRN   aspirin 81 MG tablet [id number] Yes Take 81 mg by mouth 2 (two) times daily. [provider] Taking Active   B Complex Vitamins (VITAMIN-B COMPLEX PO) [id number] Yes Take 1 tablet by mouth daily. [provider] Taking Active   buPROPion [facility] SR) 200 MG 12 hr tablet [id number] Yes TAKE 1 TABLET BY MOUTH TWICE A DAY Crecencio Mc, MD Taking Active            Med Note De Hollingshead   Tue Aug 26, 2020  3:14 PM) Taking 1 QAM  Calcium Carbonate-Vit D-Min (CALCIUM 1200 PO) [id number] Yes Take 1 capsule by mouth daily. [provider] Taking Active   cetirizine (ZYRTEC) 10 MG tablet [id number] Yes Take 10 mg by mouth daily. [provider] Taking Active   diltiazem (CARDIZEM) 30 MG tablet [id number] Yes Take 30 mg by mouth as needed.  [provider] Taking Active            Med Note ([place] Aug 26, 2020  3:17 PM) Using ~ once monthly for symptoms of afib  docusate sodium (COLACE) 100 MG capsule [id number] Yes Take 100 mg by mouth 2 (two) times daily. [provider] Taking Active   HOMEOPATHIC PRODUCTS PO [id number] Yes Take 1 tablet by mouth daily. NAD Supplement [provider] Taking Active   Melatonin 10 MG CAPS [id number] Yes Take 1 capsule by mouth daily. [provider] Taking Active   metFORMIN (GLUCOPHAGE-XR) 500 MG 24 hr tablet [id number] Yes Take  1 tablet (500 mg total) by mouth daily with breakfast. Crecencio Mc, MD Taking Active   metoprolol succinate (TOPROL-XL) 25 MG 24 hr tablet [id number] Yes Take 25 mg by mouth daily. [provider] Taking Active   Multiple Vitamin (MULTIVITAMIN) tablet [id number] Yes Take 1 tablet by mouth daily. [provider] Taking Active   Multiple Vitamins-Minerals (HAIR SKIN AND NAILS FORMULA PO) [id number] Yes Take 3 tablets by mouth daily.  [provider] Taking Active   nitroGLYCERIN (NITROSTAT) 0.4 MG SL tablet [id number] No PLACE 1 TABLET UNDER THE TONGUE EVERY 5 MINUTES AS NEEDED FOR CHEST PAIN MAY TAKE UP TO 3 DOSES.  Patient not taking: Reported on 08/26/2020   [provider] Not Taking Active   potassium chloride SA (KLOR-CON M20) 20 MEQ tablet [id number] Yes Take 1 tablet (20 mEq total) by mouth daily. Crecencio Mc, MD Taking Active   Probiotic Product [facility] DIGESTIVE PROBIOTIC) CAPS [id number] Yes Take by mouth. [provider] Taking Active   rosuvastatin (CRESTOR) 10 MG tablet [id number] Yes TAKE 1 TABLET BY MOUTH EVERY DAY Crecencio Mc, MD Taking Active   spironolactone (ALDACTONE) 25 MG tablet [id number] Yes Take 1 tablet by mouth daily. [provider] Taking Active   TURMERIC PO [id number] Yes Take 2 capsules by mouth daily. [provider] Taking Active             Patient Active Problem List   Diagnosis Date Noted   Marital conflict 27/78/2423   Melanoma in situ ([place]) 08/12/2020   Abnormal cardiac function test 08/11/2020   Edema 03/05/2020   History of total hip arthroplasty 03/05/2020   Knee stiff 03/05/2020   Pain in limb 03/05/2020   Cervical cancer screening 02/13/2020   Encounter for preventive health examination 01/13/2020   Thoracic aortic atherosclerosis ([place]) 01/13/2020   Chest pain 01/16/2019   Diuretic-induced hypokalemia 01/16/2019   Bronchiectasis ([place]) 05/20/2018   Esophageal dysmotility  05/09/2018   Multiple pulmonary nodules determined by computed tomography of lung 03/01/2018   Disorder of bursae of shoulder region 02/21/2018   History of radiofrequency ablation (RFA) procedure for cardiac arrhythmia 02/21/2018   Muscle weakness 02/21/2018   Osteoarthritis of knee 02/21/2018   Osteoarthritis of right hip 02/21/2018   Primary localized osteoarthritis of pelvic region and thigh 11/26/2017   Osteopenia 11/18/2016   Constipation, chronic 09/03/2015   Current mild episode of major depressive disorder without prior episode ([place]) 08/03/2015   Lymphedema 08/03/2015   SOB (shortness of breath) 07/15/2015   Malignant neoplasm of left female breast ([place]) 07/02/2015   Preoperative evaluation of a medical condition to rule out surgical contraindications (TAR required) 02/14/2014   Medicare annual wellness visit, subsequent 09/23/2013   Hepatic steatosis 03/25/2013   S/P bilateral mastectomy 02/27/2013   Acquired absence of both breasts and nipples 02/27/2013   Mechanical ptosis 04/21/2012   Obesity (BMI 30-39.9) 11/17/2011   Status post total left knee replacement 11/17/2011   Sleep apnea 11/17/2011   Atrial fibrillation ([place]) 11/17/2011   History of breast cancer 11/17/2011   Presence of left artificial knee joint 11/17/2011   Neuromuscular disorder ([place]) 10/18/2011   Essential hypertension 08/12/2011   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   Lymphedema of left arm 04/22/2008    Immunization History  Administered Date(s) Administered   Fluad Quad(high Dose 65+) 01/15/2019   Influenza Split 02/23/2011, 02/20/2012   Influenza Whole 02/04/2016   Influenza, High Dose Seasonal PF 01/01/2015, 02/06/2020   Influenza,inj,Quad PF,6+ Mos 12/25/2012   Influenza,inj,quad, With Preservative 12/20/2016, 12/20/2017   Influenza-Unspecified 12/16/2016, 01/03/2018   Moderna Sars-Covid-2 Vaccination 05/02/2019, 05/30/2019, 01/14/2020   PPD Test 04/25/2019   Pneumococcal Conjugate-13  09/20/2013   Pneumococcal Polysaccharide-23 03/19/2014   Tdap 11/24/2006, 12/27/2013   Zoster, Live 12/26/2007    Conditions to be addressed/monitored: HTN and HLD  Care Plan : Medication Management  Updates made by De Hollingshead, RPH-CPP since 10/20/2020 12:00 AM  Problem: Obesity, Fatty Liver Disease      Long-Range Goal: Disease Progression Progression   Start Date: 08/26/2020  This Visit's Progress: On track  Recent Progress: On track  Priority: High  Note:   Current Barriers:  Unable to achieve control of weight   Pharmacist Clinical Goal(s):  Over the next 90 days, patient will achieve control of weight through collaboration with PharmD and provider.   Interventions: 1:1 collaboration with Crecencio Mc, MD regarding development and update of comprehensive plan of care as evidenced by provider attestation and co-signature Inter-disciplinary care team collaboration (see longitudinal plan of care) Comprehensive medication review performed; medication list updated in electronic medical record  Obesity, Impaired Fasting Glucose Unable to achieve goal weight loss through diet and exercise alone; current treatment: metformin XR 500 mg daily; Ozempic 0.5 mg weekly (x 3 weeks) Denies any GI upset, including nausea, vomiting, constipation. Using Miralax to maintain bowel regularity.  Baseline weight: 228.9 lbs; most recent weight: 224 lbs (4.9 lbs, 2.1% weight loss thus far) Current meal patterns: reports that she is more cognizant of what she is eating. Eating smaller portion sizes. Choosing better options.  Current exercise: daily, exercise bike, rowing machine, sometimes hand weights Praised for improvement in weight. Increase to 1 mg weekly. She will finish the supply of 0.5 mg that she has by injecting 2 doses of 0.5 mg weekly. Follow up w/ PCP in 4 weeks. If tolerated, can increase to 2 mg weekly at that time. Encouraged to continue to focus on reduced portion size,  regular physical activity. She verbalized understanding.   Hx Atrial Fibrillation: Controlled; current treatment: rate control: metoprolol succinate 25 mg daily, anticoagulant therapy: none, though on aspirin 81 mg BID. In NSR per cardiology; diltiazem 30 mg PRN for symptoms of breakthrough palpitations, but notes only needing to take about once per month; additional antihypertensive: spironolactone 25 mg daily  Previously recommended to continue current regimen at this time. Continue to evaluate VTE risk given hx Afib.   Hyperlipidemia: Uncontrolled per last lipid panel but has not been checked since initiation of statin therapy; current treatment: rosuvastatin 10 mg daily Antiplatelet regimen: aspirin 81 mg BID - taking also for arthritis benefit  Continue current regimen at this time. Recommend lipid panel with next lab work  Anxiety/Insomnia: Controlled per patient report; current regimen: alprazolam 0.25 mg (1/4 of tablet), melatonin 10 mg, bupropion SR 200 mg QAM - not taking second dose later in the day Reports no concerns falling asleep or staying asleep.  Reports a regular bedtime and no screen time before bed.  Moving forward, consider XR bupropion if anxiety uncontrolled by only take 1 dose of SR QAM.  Supplements: MVI, biotin, probiotic, docusate 100 mg BID, cetirizine 10 mg daily, calcium + vitamin D, b complex  Patient Goals/Self-Care Activities Over the next 90 days, patient will:  - take medications as prescribed collaborate with provider on medication access solutions target a minimum of 150 minutes of moderate intensity exercise weekly engage in dietary modifications by reducing carbohydrate portion sizes  Follow Up Plan: Telephone follow up appointment with care management team member scheduled for: ~ 8 weeks      Medication Assistance: None required.  Patient affirms current coverage meets needs.  Patient's preferred pharmacy is:  [id number] [place],  [id number]: [phone]: [phone]  Follow Up:  Patient agrees to Care Plan and Follow-up.  Plan: Telephone  follow up appointment with care management team member scheduled for:  ~ 8 weeks  Catie Darnelle Maffucci, PharmD, [place], [place] Clinical Pharmacist [employer] [facility] [employer] [phone]

## 2020-10-20 NOTE — Patient Instructions (Signed)
Visit Information  PATIENT GOALS:  Goals Addressed               This Visit's Progress     Patient Stated     Medication Management (pt-stated)        Patient Goals/Self-Care Activities Over the next 90 days, patient will:  - take medications as prescribed collaborate with provider on medication access solutions target a minimum of 150 minutes of moderate intensity exercise weekly engage in dietary modifications by reducing carbohydrate portion sizes        Patient verbalizes understanding of instructions provided today and agrees to view in [place].    Plan: Telephone follow up appointment with care management team member scheduled for:  ~ 8 weeks  Catie Darnelle Maffucci, PharmD, [place], [place] Clinical Pharmacist [employer] [facility] [employer] [phone]

## 2020-10-21 ENCOUNTER — Other Ambulatory Visit: Payer: Self-pay | Admitting: Internal Medicine

## 2020-10-21 DIAGNOSIS — M5459 Other low back pain: Secondary | ICD-10-CM | POA: Diagnosis not present

## 2020-10-21 DIAGNOSIS — M545 Low back pain, unspecified: Secondary | ICD-10-CM | POA: Diagnosis not present

## 2020-10-21 DIAGNOSIS — E669 Obesity, unspecified: Secondary | ICD-10-CM

## 2020-10-21 DIAGNOSIS — R7301 Impaired fasting glucose: Secondary | ICD-10-CM

## 2020-10-21 DIAGNOSIS — G8929 Other chronic pain: Secondary | ICD-10-CM | POA: Diagnosis not present

## 2020-10-27 DIAGNOSIS — Z86018 Personal history of other benign neoplasm: Secondary | ICD-10-CM | POA: Diagnosis not present

## 2020-10-27 DIAGNOSIS — Z85828 Personal history of other malignant neoplasm of skin: Secondary | ICD-10-CM | POA: Diagnosis not present

## 2020-10-27 DIAGNOSIS — L570 Actinic keratosis: Secondary | ICD-10-CM | POA: Diagnosis not present

## 2020-10-27 DIAGNOSIS — L821 Other seborrheic keratosis: Secondary | ICD-10-CM | POA: Diagnosis not present

## 2020-10-27 DIAGNOSIS — Z872 Personal history of diseases of the skin and subcutaneous tissue: Secondary | ICD-10-CM | POA: Diagnosis not present

## 2020-10-27 DIAGNOSIS — Z859 Personal history of malignant neoplasm, unspecified: Secondary | ICD-10-CM | POA: Diagnosis not present

## 2020-10-27 DIAGNOSIS — Z85820 Personal history of malignant melanoma of skin: Secondary | ICD-10-CM | POA: Diagnosis not present

## 2020-11-17 ENCOUNTER — Other Ambulatory Visit: Payer: Self-pay | Admitting: Internal Medicine

## 2020-11-17 ENCOUNTER — Ambulatory Visit: Payer: Medicare PPO | Admitting: Internal Medicine

## 2020-11-17 ENCOUNTER — Other Ambulatory Visit: Payer: Self-pay

## 2020-11-17 ENCOUNTER — Encounter: Payer: Self-pay | Admitting: Internal Medicine

## 2020-11-17 VITALS — BP 122/66 | HR 94 | Temp 96.3°F | Resp 15 | Ht 67.0 in | Wt 218.6 lb

## 2020-11-17 DIAGNOSIS — J479 Bronchiectasis, uncomplicated: Secondary | ICD-10-CM

## 2020-11-17 DIAGNOSIS — I4891 Unspecified atrial fibrillation: Secondary | ICD-10-CM | POA: Diagnosis not present

## 2020-11-17 DIAGNOSIS — K760 Fatty (change of) liver, not elsewhere classified: Secondary | ICD-10-CM | POA: Diagnosis not present

## 2020-11-17 DIAGNOSIS — R7401 Elevation of levels of liver transaminase levels: Secondary | ICD-10-CM | POA: Diagnosis not present

## 2020-11-17 DIAGNOSIS — G4733 Obstructive sleep apnea (adult) (pediatric): Secondary | ICD-10-CM | POA: Diagnosis not present

## 2020-11-17 LAB — COMPREHENSIVE METABOLIC PANEL
ALT: 17 U/L (ref 0–35)
AST: 19 U/L (ref 0–37)
Albumin: 4.4 g/dL (ref 3.5–5.2)
Alkaline Phosphatase: 70 U/L (ref 39–117)
BUN: 12 mg/dL (ref 6–23)
CO2: 22 mEq/L (ref 19–32)
Calcium: 9.7 mg/dL (ref 8.4–10.5)
Chloride: 102 mEq/L (ref 96–112)
Creatinine, Ser: 0.75 mg/dL (ref 0.40–1.20)
GFR: 79.15 mL/min (ref >60.00)
Glucose, Bld: 89 mg/dL (ref 70–99)
Potassium: 4.4 mEq/L (ref 3.5–5.1)
Sodium: 135 mEq/L (ref 135–145)
Total Bilirubin: 0.5 mg/dL (ref 0.2–1.2)
Total Protein: 7.2 g/dL (ref 6.0–8.3)

## 2020-11-17 LAB — CK: Total CK: 43 U/L (ref 7–177)

## 2020-11-17 NOTE — Progress Notes (Signed)
Subjective:  Patient ID: Heidi Walsh, female    DOB: [birth year]  Age: 75 y.o. MRN: [id number]  CC: The primary encounter diagnosis was Elevated AST (SGOT). Diagnoses of Morbid obesity ([place]), Obstructive sleep apnea syndrome, Atrial fibrillation, unspecified type ([place]), Hepatic steatosis, and Bronchiectasis without complication ([place]) were also pertinent to this visit.  HPI Heidi Walsh presents for FOLLOW UP ON morbid obesity (BMI . 55 with hypertension , fatty liver) and other issues  This visit occurred during the SARS-CoV-2 public health emergency.  Safety protocols were in place, including screening questions prior to the visit, additional usage of staff PPE, and extensive cleaning of exam room while observing appropriate contact time as indicated for disinfecting solutions.   1) obeisty:  weight loss of 11 lbs since May success attributed to use of Ozempic for appetite suppression.  Using  1.0 mg of ozempic  in divided doses due to pen  shortage , has been taking this dose for the past 4 weeks and is still losing weight.home weight 218 lbs  . Riding stationery bike.  2) history of gastritis :  aggravated by certain foods. Intermittent   3) Depression :  aggravated by marital conflicts.  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   Mood is improving with current therapy .  Has not seen therapist yet.  Does not want escalation of meds. Using 1/4 alprazolam at bedtime.  4) Right shoulder pain since her fall last October.  Plans to see Orthopedics after researching th eproviders.  Names given.   5) PAF:  taking metoprolol daily,  using diltiazeam as her prn medication. Wearing a fit bit watch which detects her arrhythmia  . None recently   6) using spirometry regularly has improved breathing   Outpatient  Medications Prior to Visit  Medication Sig Dispense Refill   ALPRAZolam (XANAX) 0.25 MG tablet TAKE 1 TABLET BY MOUTH TWICE A DAY AS NEEDED FOR ANXIETY 60 tablet 1   aspirin 81 MG tablet Take 81 mg by mouth 2 (two) times daily.     B Complex Vitamins (VITAMIN-B COMPLEX PO) Take 1 tablet by mouth daily.     buPROPion (WELLBUTRIN SR) 200 MG 12 hr tablet TAKE 1 TABLET BY MOUTH TWICE A DAY 180 tablet 1   Calcium Carbonate-Vit D-Min (CALCIUM 1200 PO) Take 1 capsule by mouth daily.     cetirizine (ZYRTEC) 10 MG tablet Take 10 mg by mouth daily.     diltiazem (CARDIZEM) 30 MG tablet Take 30 mg by mouth as needed.      docusate sodium (COLACE) 100 MG capsule Take 100 mg by mouth 2 (two) times daily.     HOMEOPATHIC PRODUCTS PO Take 1 tablet by mouth daily. NAD Supplement     KLOR-CON M20 20 MEQ tablet TAKE 1 TABLET BY MOUTH EVERY DAY 90 tablet 1   Melatonin 10 MG CAPS Take 1 capsule by mouth daily.     metFORMIN (GLUCOPHAGE-XR) 500 MG 24 hr tablet Take 1 tablet (500 mg total) by mouth daily with breakfast. 30 tablet 5   metoprolol succinate (TOPROL-XL) 25 MG 24 hr tablet Take 25 mg by mouth daily.     Multiple Vitamin (MULTIVITAMIN) tablet Take 1 tablet by mouth daily.     Multiple Vitamins-Minerals (HAIR SKIN AND NAILS FORMULA PO) Take 3  tablets by mouth daily.     OZEMPIC, 0.25 OR 0.5 MG/DOSE, 2 MG/1.5ML SOPN INJECT 0.25 MG ONCE WEEKLY FOR 4 WEEKS, THEN INCREASE TO 0.5 MG WEEKLY 15 mL 2   Probiotic Product (TRUNATURE DIGESTIVE PROBIOTIC) CAPS Take by mouth.     rosuvastatin (CRESTOR) 10 MG tablet TAKE 1 TABLET BY MOUTH EVERY DAY 90 tablet 1   spironolactone (ALDACTONE) 25 MG tablet Take 1 tablet by mouth daily.     nitroGLYCERIN (NITROSTAT) 0.4 MG SL tablet PLACE 1 TABLET UNDER THE TONGUE EVERY 5 MINUTES AS NEEDED FOR CHEST PAIN MAY TAKE UP TO 3 DOSES. (Patient not taking: No sig reported)     No facility-administered medications prior to visit.    Review of Systems;  Patient denies headache,  fevers, malaise, unintentional weight loss, skin rash, eye pain, sinus congestion and sinus pain, sore throat, dysphagia,  hemoptysis , cough, dyspnea, wheezing, chest pain, palpitations, orthopnea, edema, abdominal pain, nausea, melena, diarrhea, constipation, flank pain, dysuria, hematuria, urinary  Frequency, nocturia, numbness, tingling, seizures,  Focal weakness, Loss of consciousness,  Tremor, insomnia, depression, anxiety, and suicidal ideation.      Objective:  BP 122/66 (BP Location: Right Arm, Patient Position: Sitting, Cuff Size: Large)   Pulse 94   Temp (!) 96.3 F (35.7 C) (Temporal)   Resp 15   Ht '5\' 7"'$  (1.702 m)   Wt 218 lb 9.6 oz (99.2 kg)   SpO2 97%   BMI 34.24 kg/m   BP Readings from Last 3 Encounters:  11/17/20 122/66  08/11/20 122/76  03/05/20 113/76    Wt Readings from Last 3 Encounters:  11/17/20 218 lb 9.6 oz (99.2 kg)  08/11/20 229 lb 3.2 oz (104 kg)  03/05/20 228 lb 3.2 oz (103.5 kg)    General appearance: alert, cooperative and appears stated age Ears: normal TM's and external ear canals both ears Throat: lips, mucosa, and tongue normal; teeth and gums normal Neck: no adenopathy, no carotid bruit, supple, symmetrical, trachea midline and thyroid not enlarged, symmetric, no tenderness/mass/nodules Back: symmetric, no curvature. ROM normal. No CVA tenderness. Lungs: clear to auscultation bilaterally Heart: regular rate and rhythm, S1, S2 normal, no murmur, click, rub or gallop Abdomen: soft, non-tender; bowel sounds normal; no masses,  no organomegaly Pulses: 2+ and symmetric Skin: Skin color, texture, turgor normal. No rashes or lesions Lymph nodes: Cervical, supraclavicular, and axillary nodes normal.  Lab Results  Component Value Date   HGBA1C 5.8 (H) 01/11/2020   HGBA1C 5.8 11/24/2017   HGBA1C 5.7 12/30/2016    Lab Results  Component Value Date   CREATININE 0.75 11/17/2020   CREATININE 0.71 08/11/2020   CREATININE 0.77 03/28/2020     Lab Results  Component Value Date   WBC 5.7 01/11/2020   HGB 13.5 01/11/2020   HCT 39.2 01/11/2020   PLT 243 01/11/2020   GLUCOSE 89 11/17/2020   CHOL 185 01/11/2020   TRIG 140 01/11/2020   HDL 57 01/11/2020   LDLDIRECT 146.4 01/11/2013   LDLCALC 104 (H) 01/11/2020   ALT 17 11/17/2020   AST 19 11/17/2020   NA 135 11/17/2020   K 4.4 11/17/2020   CL 102 11/17/2020   CREATININE 0.75 11/17/2020   BUN 12 11/17/2020   CO2 22 11/17/2020   TSH 1.38 01/16/2019   INR 1.0 11/24/2017   HGBA1C 5.8 (H) 01/11/2020    US ABDOMEN LIMITED RUQ (LIVER/GB)  Result Date: 03/17/2020 CLINICAL DATA:  Fatty liver. EXAM: ULTRASOUND ABDOMEN LIMITED RIGHT UPPER QUADRANT COMPARISON:  By ultrasound on 02/22/2013 and MRI of the abdomen on 03/05/2013 FINDINGS: Gallbladder: No gallstones or wall thickening visualized. No sonographic Murphy sign noted by sonographer. Common bile duct: Diameter: 6 mm Liver: The liver demonstrates coarse echotexture and increased echogenicity, likely reflecting diffuse steatosis. No overt cirrhotic contour abnormalities or focal lesions are identified. There is no evidence of intrahepatic biliary ductal dilatation. Portal vein is patent on color Doppler imaging with normal direction of blood flow towards the liver. Other: No ascites identified. IMPRESSION: Evidence of hepatic steatosis by ultrasound without overt cirrhosis. No focal hepatic lesions identified. Electronically Signed   By: Aletta Edouard M.D.   On: 03/17/2020 08:46    Assessment & Plan:   Problem List Items Addressed This Visit       Unprioritized   Morbid obesity ([place])    She has lost 11 lbs since May using Ozempic and metformin  With good appetite suppression.  No nausea or diarrhea   BP is under control and liver enzymes are normalizing.   Lab Results  Component Value Date   ALT 20 10/06/2020   AST 42 (H) 10/06/2020   ALKPHOS 88 10/06/2020   BILITOT 0.4 10/06/2020         Sleep apnea     Diagnosed by sleep study. She is wearing her CPAP every night a minimum of 6 hours per night and notes improved daytime wakefulness and decreased fatigue       Atrial fibrillation ([place])    S/p ablation in 2014, with no recent episodes in over 6 months .  Continue metoprolol and prn cardizem.      Hepatic steatosis    No treatment was offered by recent GI consult.  Now taking oZempic, metformin ,  STATIN and losing weight.  ALT was minimally elevated and has now resolved .  She is avoiding alcohol and NSAIDs  Lab Results  Component Value Date   ALT 17 11/17/2020   AST 19 11/17/2020   ALKPHOS 70 11/17/2020   BILITOT 0.5 11/17/2020         Bronchiectasis ([place])    Using spirometry with improved symptoms. 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  Has had 2nd opinion from [facility] pulmonology ,  No indication for antibiotics. At this time.  She has not had a productive cough in over 6 months       Other Visit Diagnoses     Elevated AST (SGOT)    -  Primary   Relevant Orders   Comprehensive metabolic panel (Completed)   CK (Creatine Kinase) (Completed)        I spent 40 minutes dedicated to the care of this patient on the date of this encounter to include pre-visit review of her medical history,  review of labs with patient. Face-to-face time with the patient , counselling on issues of marital conflict and weight management,  and post visit ordering of testing and therapeutics.   Medications Discontinued During This Encounter  Medication Reason   nitroGLYCERIN (NITROSTAT) 0.4 MG SL tablet     Follow-up: No follow-ups on file.   Crecencio Mc, MD

## 2020-11-17 NOTE — Assessment & Plan Note (Signed)
She has lost 11 lbs since May using Ozempic and metformin  With good appetite suppression.  No nausea or diarrhea   BP is under control and liver enzymes are normalizing.   Lab Results  Component Value Date   ALT 20 10/06/2020   AST 42 (H) 10/06/2020   ALKPHOS 88 10/06/2020   BILITOT 0.4 10/06/2020

## 2020-11-17 NOTE — Patient Instructions (Addendum)
Congratulations!  You have lost 11 lbs since your last visit! I recommend that you continue 1 mg dose of Ozempic until your weight plateaus.  Then let me know if you are at the same weight for 2 weeks,,  we will increase your dose to the next higher dose      [facility]  you should check out. :   Poggi,    Heidi Walsh Roselyn Meier Supple

## 2020-11-18 DIAGNOSIS — G8929 Other chronic pain: Secondary | ICD-10-CM

## 2020-11-18 NOTE — Assessment & Plan Note (Addendum)
No treatment was offered by recent GI consult.  Now taking oZempic, metformin ,  STATIN and losing weight.  ALT was minimally elevated and has now resolved .  She is avoiding alcohol and NSAIDs  Lab Results  Component Value Date   ALT 17 11/17/2020   AST 19 11/17/2020   ALKPHOS 70 11/17/2020   BILITOT 0.5 11/17/2020

## 2020-11-18 NOTE — Assessment & Plan Note (Signed)
Using spirometry with improved symptoms. 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  Has had 2nd opinion from [facility] pulmonology ,  No indication for antibiotics. At this time.  She has not had a productive cough in over 6 months

## 2020-11-18 NOTE — Assessment & Plan Note (Signed)
S/p ablation in 2014, with no recent episodes in over 6 months .  Continue metoprolol and prn cardizem.

## 2020-11-18 NOTE — Assessment & Plan Note (Signed)
Diagnosed by sleep study. She is wearing her CPAP every night a minimum of 6 hours per night and notes improved daytime wakefulness and decreased fatigue  

## 2020-12-02 NOTE — Telephone Encounter (Signed)
Spoke with pt and she stated that on Sunday she was stung by either a hornet or a yellow jacket in two different places, one being on the palm of right hand and the other right upper under the right buttock. Pt stated that they are both red, warm and very itchy. There was swelling but she stated that has gone down. She is using both Benadryl and rx strength triamcinolone cream that was prescribed by dermatology but neither is working. Pt is wondering what else she can do. No appts available.

## 2020-12-02 NOTE — Telephone Encounter (Signed)
LMTCB

## 2020-12-03 ENCOUNTER — Other Ambulatory Visit: Payer: Self-pay | Admitting: Internal Medicine

## 2020-12-03 MED ORDER — PREDNISONE 10 MG PO TABS: ORAL_TABLET | ORAL | 0 refills | Status: DC

## 2020-12-03 NOTE — Progress Notes (Signed)
My Chart message sent

## 2020-12-18 ENCOUNTER — Other Ambulatory Visit: Payer: Self-pay

## 2020-12-18 ENCOUNTER — Other Ambulatory Visit: Payer: Self-pay | Admitting: Internal Medicine

## 2020-12-18 DIAGNOSIS — R7301 Impaired fasting glucose: Secondary | ICD-10-CM

## 2020-12-18 DIAGNOSIS — E669 Obesity, unspecified: Secondary | ICD-10-CM

## 2020-12-18 MED ORDER — OZEMPIC (0.25 OR 0.5 MG/DOSE) 2 MG/1.5ML ~~LOC~~ SOPN: 0.5000 mg | PEN_INJECTOR | SUBCUTANEOUS | 2 refills | Status: DC

## 2020-12-22 ENCOUNTER — Telehealth: Payer: Medicare PPO

## 2020-12-23 ENCOUNTER — Ambulatory Visit (INDEPENDENT_AMBULATORY_CARE_PROVIDER_SITE_OTHER): Payer: Medicare PPO | Admitting: Pharmacist

## 2020-12-23 DIAGNOSIS — I4891 Unspecified atrial fibrillation: Secondary | ICD-10-CM

## 2020-12-23 DIAGNOSIS — R7301 Impaired fasting glucose: Secondary | ICD-10-CM

## 2020-12-23 DIAGNOSIS — I700 Atherosclerosis of aorta: Secondary | ICD-10-CM

## 2020-12-23 DIAGNOSIS — K760 Fatty (change of) liver, not elsewhere classified: Secondary | ICD-10-CM

## 2020-12-23 MED ORDER — SEMAGLUTIDE (2 MG/DOSE) 8 MG/3ML ~~LOC~~ SOPN: 2.0000 mg | PEN_INJECTOR | SUBCUTANEOUS | 2 refills | Status: DC

## 2020-12-23 NOTE — Chronic Care Management (AMB) (Signed)
Chronic Care Management Pharmacy Note  12/23/2020 Name:  Heidi Walsh MRN:  [id number] DOB:  1945-12-10  Subjective: Heidi Walsh is an 75 y.o. year old female who is a primary patient of Derrel Nip, Aris Everts, MD.  The CCM team was consulted for assistance with disease management and care coordination needs.    Engaged with patient by telephone for follow up visit in response to provider referral for pharmacy case management and/or care coordination services.   Consent to Services:  The patient was given information about Chronic Care Management services, agreed to services, and gave verbal consent prior to initiation of services.  Please see initial visit note for detailed documentation.   Patient Care Team: Crecencio Mc, MD as PCP - General (Internal Medicine) De Hollingshead, RPH-CPP (Pharmacist)    Objective:  Lab Results  Component Value Date   CREATININE 0.75 11/17/2020   CREATININE 0.71 08/11/2020   CREATININE 0.77 03/28/2020    Lab Results  Component Value Date   HGBA1C 5.8 (H) 01/11/2020   Last diabetic Eye exam: No results found for: HMDIABEYEEXA  Last diabetic Foot exam: No results found for: HMDIABFOOTEX      Component Value Date/Time   CHOL 185 01/11/2020 1620   TRIG 140 01/11/2020 1620   HDL 57 01/11/2020 1620   CHOLHDL 3.2 01/11/2020 1620   VLDL 27.6 11/06/2014 0948   LDLCALC 104 (H) 01/11/2020 1620   LDLDIRECT 146.4 01/11/2013 0911    Hepatic Function Latest Ref Rng & Units 11/17/2020 10/06/2020 08/11/2020  Total Protein 6.0 - 8.3 g/dL 7.2 7.4 7.3  Albumin 3.5 - 5.2 g/dL 4.4 4.8(H) 4.6  AST 0 - 37 U/L 19 42(H) 17  ALT 0 - 35 U/L $Remo'17 20 19  'xAtoY$ Alk Phosphatase 39 - 117 U/L 70 88 74  Total Bilirubin 0.2 - 1.2 mg/dL 0.5 0.4 0.4  Bilirubin, Direct 0.00 - 0.40 mg/dL - <0.10 -    Lab Results  Component Value Date/Time   TSH 1.38 01/16/2019 02:26 PM   TSH 2.30 11/24/2017 12:40 PM    CBC Latest Ref Rng & Units 01/11/2020 01/16/2019  05/19/2018  WBC 3.8 - 10.8 Thousand/uL 5.7 5.8 5.4  Hemoglobin 11.7 - 15.5 g/dL 13.5 14.8 13.7  Hematocrit 35.0 - 45.0 % 39.2 43.3 38.6  Platelets 140 - 400 Thousand/uL 243 219.0 267    Lab Results  Component Value Date/Time   VD25OH 42 01/11/2020 04:20 PM   VD25OH 32.44 11/24/2017 12:40 PM   VD25OH 33.39 11/06/2014 09:48 AM   Social History   Tobacco Use  Smoking Status Never  Smokeless Tobacco Never   BP Readings from Last 3 Encounters:  11/17/20 122/66  08/11/20 122/76  03/05/20 113/76   Pulse Readings from Last 3 Encounters:  11/17/20 94  08/11/20 96  03/05/20 84   Wt Readings from Last 3 Encounters:  12/23/20 214 lb (97.1 kg)  11/17/20 218 lb 9.6 oz (99.2 kg)  08/11/20 229 lb 3.2 oz (104 kg)    Assessment: Review of patient past medical history, allergies, medications, health status, including review of consultants reports, laboratory and other test data, was performed as part of comprehensive evaluation and provision of chronic care management services.   SDOH:  (Social Determinants of Health) assessments and interventions performed:  SDOH Interventions    Flowsheet Row Most Recent Value  SDOH Interventions   Financial Strain Interventions Intervention Not Indicated       CCM Care Plan  Allergies  Allergen Reactions  Oxycodone Nausea And Vomiting   Penicillins Anaphylaxis   Ancef [Cefazolin] Swelling   Cephalexin Rash   Rivaroxaban Nausea Only    Medications Reviewed Today     Reviewed by De Hollingshead, RPH-CPP (Pharmacist) on 12/23/20 at [place] List Status: <None>   Medication Order Taking? Sig Documenting Provider Last Dose Status Informant  ALPRAZolam (XANAX) 0.25 MG tablet [id number] Yes TAKE 1 TABLET BY MOUTH TWICE A DAY AS NEEDED FOR ANXIETY Crecencio Mc, MD Taking Active            Med Note De Hollingshead   Tue Aug 26, 2020  3:11 PM) Taking 1/4 tablet QPM PRN   aspirin 81 MG tablet [id number] Yes Take 81 mg by mouth 2 (two)  times daily. [provider] Taking Active   B Complex Vitamins (VITAMIN-B COMPLEX PO) [id number] Yes Take 1 tablet by mouth daily. [provider] Taking Active   buPROPion [facility] SR) 200 MG 12 hr tablet [id number] Yes TAKE 1 TABLET BY MOUTH TWICE A DAY Crecencio Mc, MD Taking Active            Med Note De Hollingshead   Tue Aug 26, 2020  3:14 PM) Taking 1 QAM  Calcium Carbonate-Vit D-Min (CALCIUM 1200 PO) [id number] Yes Take 1 capsule by mouth daily. [provider] Taking Active   cetirizine (ZYRTEC) 10 MG tablet [id number] Yes Take 10 mg by mouth daily. [provider] Taking Active   diltiazem (CARDIZEM) 30 MG tablet [id number] Yes Take 30 mg by mouth as needed.  [provider] Taking Active            Med Note ([place] Aug 26, 2020  3:17 PM) Using ~ once monthly for symptoms of afib  docusate sodium (COLACE) 100 MG capsule [id number] Yes Take 100 mg by mouth 2 (two) times daily. [provider] Taking Active   HOMEOPATHIC PRODUCTS PO [id number] Yes Take 1 tablet by mouth daily. NAD Supplement [provider] Taking Active   KLOR-CON M20 20 MEQ tablet [id number] Yes TAKE 1 TABLET BY MOUTH EVERY DAY Crecencio Mc, MD Taking Active   Melatonin 10 MG CAPS [id number] Yes Take 1 capsule by mouth daily. [provider] Taking Active   metFORMIN (GLUCOPHAGE-XR) 500 MG 24 hr tablet [id number] Yes TAKE 1 TABLET BY MOUTH EVERY DAY WITH BREAKFAST Crecencio Mc, MD Taking Active   metoprolol succinate (TOPROL-XL) 25 MG 24 hr tablet [id number] Yes Take 25 mg by mouth daily. [provider] Taking Active   Multiple Vitamin (MULTIVITAMIN) tablet [id number] Yes Take 1 tablet by mouth daily. [provider] Taking Active   Multiple Vitamins-Minerals (HAIR SKIN AND NAILS FORMULA PO) [id number] Yes Take 3 tablets by mouth daily. [provider] Taking Active   Probiotic Product (TRUNATURE  DIGESTIVE PROBIOTIC) CAPS [id number] Yes Take by mouth. [provider] Taking Active   rosuvastatin (CRESTOR) 10 MG tablet [id number] Yes TAKE 1 TABLET BY MOUTH EVERY DAY Crecencio Mc, MD Taking Active   Semaglutide,0.25 or 0.5MG /DOS, (OZEMPIC, 0.25 OR 0.5 MG/DOSE,) 2 MG/1.5ML SOPN [id number] Yes Inject 0.5 mg into the skin once a week. Crecencio Mc, MD Taking Active            Med Note De Hollingshead   Tue Dec 23, 2020  8:56 AM) 1 mg weekly  spironolactone (ALDACTONE) 25 MG tablet [id number] Yes Take 1 tablet by mouth daily. [provider]  Taking Active             Patient Active Problem List   Diagnosis Date Noted   Marital conflict 00/17/4944   Melanoma in situ ([place]) 08/12/2020   Abnormal cardiac function test 08/11/2020   Edema 03/05/2020   History of total hip arthroplasty 03/05/2020   Knee stiff 03/05/2020   Pain in limb 03/05/2020   Cervical cancer screening 02/13/2020   Encounter for preventive health examination 01/13/2020   Thoracic aortic atherosclerosis ([place]) 01/13/2020   Chest pain 01/16/2019   Diuretic-induced hypokalemia 01/16/2019   Bronchiectasis ([place]) 05/20/2018   Esophageal dysmotility 05/09/2018   Multiple pulmonary nodules determined by computed tomography of lung 03/01/2018   Disorder of bursae of shoulder region 02/21/2018   History of radiofrequency ablation (RFA) procedure for cardiac arrhythmia 02/21/2018   Muscle weakness 02/21/2018   Osteoarthritis of knee 02/21/2018   Osteoarthritis of right hip 02/21/2018   Primary localized osteoarthritis of pelvic region and thigh 11/26/2017   Osteopenia 11/18/2016   Constipation, chronic 09/03/2015   Current mild episode of major depressive disorder without prior episode ([place]) 08/03/2015   Lymphedema 08/03/2015   SOB (shortness of breath) 07/15/2015   Malignant neoplasm of left female breast ([place]) 07/02/2015   Preoperative evaluation of a medical condition to rule out surgical  contraindications (TAR required) 02/14/2014   Medicare annual wellness visit, subsequent 09/23/2013   Hepatic steatosis 03/25/2013   S/P bilateral mastectomy 02/27/2013   Acquired absence of both breasts and nipples 02/27/2013   Mechanical ptosis 04/21/2012   Morbid obesity ([place]) 11/17/2011   Status post total left knee replacement 11/17/2011   Sleep apnea 11/17/2011   Atrial fibrillation ([place]) 11/17/2011   History of breast cancer 11/17/2011   Presence of left artificial knee joint 11/17/2011   Neuromuscular disorder ([place]) 10/18/2011   Essential hypertension 08/12/2011   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   Lymphedema of left arm 04/22/2008    Immunization History  Administered Date(s) Administered   Fluad Quad(high Dose 65+) 01/15/2019   Influenza Split 02/23/2011, 02/20/2012   Influenza Whole 02/04/2016   Influenza, High Dose Seasonal PF 01/01/2015, 02/06/2020   Influenza,inj,Quad PF,6+ Mos 12/25/2012   Influenza,inj,quad, With Preservative 12/20/2016, 12/20/2017   Influenza-Unspecified 12/16/2016, 01/03/2018   Moderna Sars-Covid-2 Vaccination 05/02/2019, 05/30/2019, 01/14/2020, 10/17/2020   PPD Test 04/25/2019   Pneumococcal Conjugate-13 09/20/2013   Pneumococcal Polysaccharide-23 03/19/2014   Tdap 11/24/2006, 12/27/2013   Zoster, Live 12/26/2007    Conditions to be addressed/monitored: Obesity, Afib, Fatty Liver Disease  Care Plan : Medication Management  Updates made by De Hollingshead, RPH-CPP since 12/23/2020 12:00 AM     Problem: Obesity, Fatty Liver Disease      Long-Range Goal: Disease Progression Progression   Start Date: 08/26/2020  This Visit's Progress: On track  Recent Progress: On track  Priority: High  Note:   Current Barriers:  Unable to achieve control of weight   Pharmacist Clinical Goal(s):  Over the next 90 days, patient will achieve control of weight through collaboration with PharmD and provider.   Interventions: 1:1  collaboration with Crecencio Mc, MD regarding development and update of comprehensive plan of care as evidenced by provider attestation and co-signature Inter-disciplinary care team collaboration (see longitudinal plan of care) Comprehensive medication review performed; medication list updated in electronic medical record  Obesity, Impaired Fasting Glucose Unable to achieve goal weight loss through diet and exercise alone; current treatment: metformin XR 500 mg daily; Ozempic 1 mg weekly (using two 0.5 mg weekly  doses) Denies any GI upset, including nausea, vomiting, constipation with dose increase Baseline weight: 228.9 lbs; most recent weight: 214 lbs (14 lbs, 6% weight loss thus far), patient's personal goal is 200 lbs Current meal patterns: skips breakfast, lunch is usually fruit; supper is cooked for husband - meatloaf last night, au gratin potatoes; vegetable soup; makes cookies for her husband but she generally avoids; likes popcorn snack but only eats once per week Current exercise: apple watch, closes 3 rings daily (movement, standing, exercises); using exercise bike, plans to walk more now that weather is better Discussed incorporating protein earlier in the day, rather than going most of the day without eating. Patient verbalized understanding.  Discussed periodic increases in weekly physical activity to target total of 150 minutes weekly. Patient verbalized understanding.  Praised for improvement in weight. Increase to 2 mg weekly. Patient has a week remaining of supply to administer 1 mg weekly, she will complete that which should give the pharmacy time to determine if they can order Ozempic 2 mg supply.  Reviewed overall goal of 5-10% weight loss from baseline.   Hx Atrial Fibrillation: Controlled; current treatment: rate control: metoprolol succinate 25 mg daily, anticoagulant therapy: none, though on aspirin 81 mg BID. In NSR per cardiology; diltiazem 30 mg PRN for symptoms of  breakthrough palpitations, but notes only needing to take about once per month; additional antihypertensive: spironolactone 25 mg daily  Previously recommended to continue current regimen at this time. Continue to evaluate VTE risk given hx Afib.   Hyperlipidemia: Uncontrolled per last lipid panel but has not been checked since initiation of statin therapy; current treatment: rosuvastatin 10 mg daily Antiplatelet regimen: aspirin 81 mg BID - taking also for arthritis benefit  Recommended to continue current regimen at this time. Recommend updated lipid panel with next appointment.   Anxiety/Insomnia: Controlled per patient report; current regimen: alprazolam 0.25 mg (1/4 of tablet), melatonin 10 mg, bupropion SR 200 mg QAM - not taking second dose later in the day Reports no concerns falling asleep or staying asleep.  Reports a regular bedtime and no screen time before bed.  Moving forward, consider XR bupropion if anxiety uncontrolled by only take 1 dose of SR QAM.  Supplements: MVI, biotin, probiotic, docusate 100 mg BID, cetirizine 10 mg daily, calcium + vitamin D, b complex  Patient Goals/Self-Care Activities Over the next 90 days, patient will:  - take medications as prescribed collaborate with provider on medication access solutions target a minimum of 150 minutes of moderate intensity exercise weekly engage in dietary modifications by reducing carbohydrate portion sizes  Follow Up Plan: Telephone follow up appointment with care management team member scheduled for: ~ 6 weeks      Medication Assistance: None required.  Patient affirms current coverage meets needs.  Patient's preferred pharmacy is:  [id number] - [place], [place] [address] Phone: [phone] Fax: [phone]   Follow Up:  Patient agrees to Care Plan and Follow-up.   Plan: Telephone follow up appointment with care management team member scheduled for:  ~6  weeks  Catie Darnelle Maffucci, PharmD, [place], [place] Clinical Pharmacist [employer] [facility] [employer] [phone]

## 2020-12-23 NOTE — Patient Instructions (Signed)
Visit Information  PATIENT GOALS:  Goals Addressed               This Visit's Progress     Patient Stated     Medication Management (pt-stated)        Patient Goals/Self-Care Activities Over the next 90 days, patient will:  - take medications as prescribed collaborate with provider on medication access solutions target a minimum of 150 minutes of moderate intensity exercise weekly engage in dietary modifications by reducing carbohydrate portion sizes         Patient verbalizes understanding of instructions provided today and agrees to view in [place].    Plan: Telephone follow up appointment with care management team member scheduled for:  ~6 weeks  Catie Darnelle Maffucci, PharmD, [place], [place] Clinical Pharmacist [employer] [facility] [employer] [phone]

## 2020-12-26 DIAGNOSIS — H04123 Dry eye syndrome of bilateral lacrimal glands: Secondary | ICD-10-CM | POA: Diagnosis not present

## 2020-12-26 DIAGNOSIS — H25813 Combined forms of age-related cataract, bilateral: Secondary | ICD-10-CM | POA: Diagnosis not present

## 2021-01-14 DIAGNOSIS — M7541 Impingement syndrome of right shoulder: Secondary | ICD-10-CM | POA: Diagnosis not present

## 2021-01-19 ENCOUNTER — Ambulatory Visit: Payer: Medicare PPO

## 2021-01-19 DIAGNOSIS — I4891 Unspecified atrial fibrillation: Secondary | ICD-10-CM | POA: Diagnosis not present

## 2021-01-19 DIAGNOSIS — E785 Hyperlipidemia, unspecified: Secondary | ICD-10-CM

## 2021-01-21 ENCOUNTER — Telehealth: Payer: Medicare PPO

## 2021-01-26 DIAGNOSIS — M25511 Pain in right shoulder: Secondary | ICD-10-CM | POA: Diagnosis not present

## 2021-01-28 DIAGNOSIS — M25511 Pain in right shoulder: Secondary | ICD-10-CM | POA: Diagnosis not present

## 2021-02-02 ENCOUNTER — Ambulatory Visit (INDEPENDENT_AMBULATORY_CARE_PROVIDER_SITE_OTHER): Payer: Medicare PPO | Admitting: Pharmacist

## 2021-02-02 VITALS — Wt 206.0 lb

## 2021-02-02 DIAGNOSIS — I10 Essential (primary) hypertension: Secondary | ICD-10-CM

## 2021-02-02 DIAGNOSIS — K760 Fatty (change of) liver, not elsewhere classified: Secondary | ICD-10-CM

## 2021-02-02 DIAGNOSIS — R7301 Impaired fasting glucose: Secondary | ICD-10-CM

## 2021-02-02 NOTE — Patient Instructions (Signed)
Visit Information  Patient Goals/Self-Care Activities Over the next 90 days, patient will:  - take medications as prescribed collaborate with provider on medication access solutions target a minimum of 150 minutes of moderate intensity exercise weekly engage in dietary modifications by reducing carbohydrate portion sizes  Patient verbalizes understanding of instructions provided today and agrees to view in [place].  Plan: Telephone follow up appointment with care management team member scheduled for:  2 months  Catie Darnelle Maffucci, PharmD, [place], [place] Clinical Pharmacist [employer] [facility] [employer] [phone]

## 2021-02-02 NOTE — Chronic Care Management (AMB) (Addendum)
Chronic Care Management CCM Pharmacy Note  02/02/2021 Name:  Heidi Walsh MRN:  [id number] DOB:  28-Aug-1945  Summary: - Total weight loss ~ 22 lbs (10%). Doing well.  Recommendations/Changes made from today's visit: - Continue Ozempic 2 mg weekly. Patient will let me know where to send refill as her pharmacy does not have it in stock  Subjective: Heidi Walsh is an 75 y.o. year old female who is a primary patient of Derrel Nip, Aris Everts, MD.  The CCM team was consulted for assistance with disease management and care coordination needs.    Engaged with patient by telephone for follow up visit for pharmacy case management and/or care coordination services.   Objective:  Medications Reviewed Today     Reviewed by De Hollingshead, RPH-CPP (Pharmacist) on 02/02/21 at [phone]  Med List Status: <None>   Medication Order Taking? Sig Documenting Provider Last Dose Status Informant  ALPRAZolam (XANAX) 0.25 MG tablet [id number]  TAKE 1 TABLET BY MOUTH TWICE A DAY AS NEEDED FOR ANXIETY Crecencio Mc, MD  Active            Med Note De Hollingshead   Tue Aug 26, 2020  3:11 PM) Taking 1/4 tablet QPM PRN   aspirin 81 MG tablet [id number]  Take 81 mg by mouth 2 (two) times daily. [provider]  Active   B Complex Vitamins (VITAMIN-B COMPLEX PO) [id number]  Take 1 tablet by mouth daily. [provider]  Active   buPROPion [facility] SR) 200 MG 12 hr tablet [id number]  TAKE 1 TABLET BY MOUTH TWICE A DAY Crecencio Mc, MD  Active            Med Note De Hollingshead   Tue Aug 26, 2020  3:14 PM) Taking 1 QAM  Calcium Carbonate-Vit D-Min (CALCIUM 1200 PO) [id number]  Take 1 capsule by mouth daily. [provider]  Active   cetirizine (ZYRTEC) 10 MG tablet [id number]  Take 10 mg by mouth daily. [provider]  Active   diltiazem (CARDIZEM) 30 MG tablet [id number]  Take 30 mg by mouth as needed.  [provider]  Active            Med Note  ([place] Aug 26, 2020  3:17 PM) Using ~ once monthly for symptoms of afib  docusate sodium (COLACE) 100 MG capsule [id number]  Take 100 mg by mouth 2 (two) times daily. [provider]  Active   HOMEOPATHIC PRODUCTS PO [id number]  Take 1 tablet by mouth daily. NAD Supplement [provider]  Active   KLOR-CON M20 20 MEQ tablet [id number]  TAKE 1 TABLET BY MOUTH EVERY DAY Crecencio Mc, MD  Active   Melatonin 10 MG CAPS [id number]  Take 1 capsule by mouth daily. [provider]  Active   metFORMIN (GLUCOPHAGE-XR) 500 MG 24 hr tablet [id number]  TAKE 1 TABLET BY MOUTH EVERY DAY WITH BREAKFAST Crecencio Mc, MD  Active   metoprolol succinate (TOPROL-XL) 25 MG 24 hr tablet [id number]  Take 25 mg by mouth daily. [provider]  Active   Multiple Vitamin (MULTIVITAMIN) tablet [id number]  Take 1 tablet by mouth daily. [provider]  Active   Multiple Vitamins-Minerals (HAIR SKIN AND NAILS FORMULA PO) [id number]  Take 3 tablets by mouth daily. [provider]  Active   Probiotic Product (TRUNATURE DIGESTIVE PROBIOTIC) CAPS [id number]  Take by mouth. [provider]  Active  rosuvastatin (CRESTOR) 10 MG tablet [id number] Yes TAKE 1 TABLET BY MOUTH EVERY DAY Crecencio Mc, MD Taking Active   Semaglutide, 2 MG/DOSE, 8 MG/3ML SOPN [id number] Yes Inject 2 mg as directed once a week. Crecencio Mc, MD Taking Active   spironolactone (ALDACTONE) 25 MG tablet [id number] Yes Take 1 tablet by mouth daily. [provider] Taking Active             Pertinent Labs:   Lab Results  Component Value Date   HGBA1C 5.8 (H) 01/11/2020   Lab Results  Component Value Date   CHOL 185 01/11/2020   HDL 57 01/11/2020   LDLCALC 104 (H) 01/11/2020   LDLDIRECT 146.4 01/11/2013   TRIG 140 01/11/2020   CHOLHDL 3.2 01/11/2020   Lab Results  Component Value Date   CREATININE 0.75 11/17/2020   BUN 12 11/17/2020   NA 135 11/17/2020    K 4.4 11/17/2020   CL 102 11/17/2020   CO2 22 11/17/2020   Wt Readings from Last 3 Encounters:  02/02/21 206 lb (93.4 kg)  12/23/20 214 lb (97.1 kg)  11/17/20 218 lb 9.6 oz (99.2 kg)   Temp Readings from Last 3 Encounters:  11/17/20 (!) 96.3 F (35.7 C) (Temporal)  08/11/20 (!) 96.6 F (35.9 C) (Temporal)  02/12/20 97.7 F (36.5 C) (Oral)   BP Readings from Last 3 Encounters:  11/17/20 122/66  08/11/20 122/76  03/05/20 113/76   Pulse Readings from Last 3 Encounters:  11/17/20 94  08/11/20 96  03/05/20 84     SDOH:  (Social Determinants of Health) assessments and interventions performed:    [place]  Review of patient past medical history, allergies, medications, health status, including review of consultants reports, laboratory and other test data, was performed as part of comprehensive evaluation and provision of chronic care management services.   Care Plan : Medication Management  Updates made by De Hollingshead, RPH-CPP since 02/02/2021 12:00 AM     Problem: Obesity, Fatty Liver Disease      Long-Range Goal: Disease Progression Progression   Start Date: 08/26/2020  Recent Progress: On track  Priority: High  Note:   Current Barriers:  Unable to achieve control of weight   Pharmacist Clinical Goal(s):  Over the next 90 days, patient will achieve control of weight through collaboration with PharmD and provider.   Interventions: 1:1 collaboration with Crecencio Mc, MD regarding development and update of comprehensive plan of care as evidenced by provider attestation and co-signature Inter-disciplinary care team collaboration (see longitudinal plan of care) Comprehensive medication review performed; medication list updated in electronic medical record  Health Maintenance   Yearly influenza vaccination: up to date - added to chart today Td/Tdap vaccination: up to date Pneumonia vaccination: up to date COVID vaccinations: due - consider bivalent  booster moving forward Shingrix vaccinations: due - counseled today, encouraged moving forward  Colonoscopy: up to date Bone density scan: up to date Mammogram: up to date   Obesity, Impaired Fasting Glucose Unable to achieve goal weight loss through diet and exercise alone; current treatment: metformin XR 500 mg daily; Ozempic 2 mg weekly  - though reports today that the pharmacy filled the old script for 0.25/0.5 mg dose Denies any GI upset, including nausea, vomiting, constipation with dose increase Baseline weight: 228.9 lbs; most recent weight: 206 lbs (22 lbs, 10% weight loss thus far), patient's personal goal is 200 lbs Current exercise: apple watch, closes 3 rings daily (movement, standing, exercises); using  exercise bike, plans to walk more now that weather is better Current meal patterns: breakfast: greek yogurt, smaller meal with lunch, bigger meal with supper. Focusing on lean proteins, vegetables.  Reports she can tell her exercise tolerance has improved with weight loss. At the beach right now and walking at least 40 minutes daily.  Praised for continued weight loss success. Encouraged continued focus on dietary modifications and increase in physical activity Called pharmacy, cancelled script for Ozempic 0.25/0.5 and 1 mg, asked to fill 2 mg dose. They have canceled scripts, but 2 mg dose is on backorder. Patient declines filling at [facility] and mailing to her. She will call around to local pharmacies and see who has Ozempic 2 mg in stock.  Per patient request, ordered A1c, CMP. Follow for results.   Hx Atrial Fibrillation: Controlled; current treatment: rate control: metoprolol succinate 25 mg daily, anticoagulant therapy: none, though on aspirin 81 mg BID. In NSR per cardiology; diltiazem 30 mg PRN for symptoms of breakthrough palpitations, but notes only needing to take about once per month; additional antihypertensive: spironolactone 25 mg daily  Previously recommended to  continue current regimen at this time. Continue to evaluate VTE risk given hx Afib.   Hyperlipidemia: Uncontrolled per last lipid panel but has not been checked since initiation of statin therapy; current treatment: rosuvastatin 10 mg daily Antiplatelet regimen: aspirin 81 mg BID - taking also for arthritis benefit  Scheduled fasting lipid panel. Follow results.   Anxiety/Insomnia: Controlled per patient report; current regimen: alprazolam 0.25 mg (1/4 of tablet), melatonin 10 mg, bupropion SR 200 mg QAM - not taking second dose later in the day Moving forward, consider XR bupropion if anxiety uncontrolled by only taking 1 dose of SR QAM.  Supplements: MVI, biotin, probiotic, docusate 100 mg BID, cetirizine 10 mg daily, calcium + vitamin D, b complex  Patient Goals/Self-Care Activities Over the next 90 days, patient will:  - take medications as prescribed collaborate with provider on medication access solutions target a minimum of 150 minutes of moderate intensity exercise weekly engage in dietary modifications by reducing carbohydrate portion sizes      Plan: Telephone follow up appointment with care management team member scheduled for:  2 months  Catie Darnelle Maffucci, PharmD, [place], CPP Clinical Pharmacist [employer] at [facility] [phone]    I have reviewed the above information and agree with above.   Deborra Medina, MD

## 2021-02-03 ENCOUNTER — Ambulatory Visit: Payer: Medicare PPO | Admitting: Pharmacist

## 2021-02-03 ENCOUNTER — Telehealth: Payer: Self-pay | Admitting: Internal Medicine

## 2021-02-03 DIAGNOSIS — I10 Essential (primary) hypertension: Secondary | ICD-10-CM

## 2021-02-03 DIAGNOSIS — R7301 Impaired fasting glucose: Secondary | ICD-10-CM

## 2021-02-03 DIAGNOSIS — I4891 Unspecified atrial fibrillation: Secondary | ICD-10-CM

## 2021-02-03 MED ORDER — SEMAGLUTIDE (2 MG/DOSE) 8 MG/3ML ~~LOC~~ SOPN: 2.0000 mg | PEN_INJECTOR | SUBCUTANEOUS | 1 refills | Status: DC

## 2021-02-03 NOTE — Telephone Encounter (Signed)
Pt called in stating that you had advise her to call you back after she made her decision. Pt stated that she made her decision and she would like to use [facility] in [place].

## 2021-02-03 NOTE — Patient Instructions (Signed)
Visit Information  Patient Goals/Self-Care Activities Over the next 90 days, patient will:  - take medications as prescribed collaborate with provider on medication access solutions target a minimum of 150 minutes of moderate intensity exercise weekly engage in dietary modifications by reducing carbohydrate portion sizes  Patient verbalizes understanding of instructions provided today and agrees to view in [place].   Plan: Telephone follow up appointment with care management team member scheduled for:  8 weeks  Catie Darnelle Maffucci, PharmD, [place], [place] Clinical Pharmacist [employer] at [employer] [phone]

## 2021-02-03 NOTE — Chronic Care Management (AMB) (Addendum)
Chronic Care Management CCM Pharmacy Note  02/03/2021 Name:  Heidi Walsh MRN:  [id number] DOB:  11-14-1945  Summary: - [facility]  Recommendations/Changes made from today's visit: - [facility] [place]  Subjective: Heidi Walsh is an 75 y.o. year old female who is a primary patient of Derrel Nip, Aris Everts, MD.  The CCM team was consulted for assistance with disease management and care coordination needs.    Care coordination  for  medication access  for pharmacy case management and/or care coordination services.   Objective:  Medications Reviewed Today     Reviewed by De Hollingshead, RPH-CPP (Pharmacist) on 02/02/21 at [phone]  Med List Status: <None>   Medication Order Taking? Sig Documenting Provider Last Dose Status Informant  ALPRAZolam (XANAX) 0.25 MG tablet [id number]  TAKE 1 TABLET BY MOUTH TWICE A DAY AS NEEDED FOR ANXIETY Crecencio Mc, MD  Active            Med Note De Hollingshead   Tue Aug 26, 2020  3:11 PM) Taking 1/4 tablet QPM PRN   aspirin 81 MG tablet [id number]  Take 81 mg by mouth 2 (two) times daily. [provider]  Active   B Complex Vitamins (VITAMIN-B COMPLEX PO) [id number]  Take 1 tablet by mouth daily. [provider]  Active   buPROPion [facility] SR) 200 MG 12 hr tablet [id number]  TAKE 1 TABLET BY MOUTH TWICE A DAY Crecencio Mc, MD  Active            Med Note De Hollingshead   Tue Aug 26, 2020  3:14 PM) Taking 1 QAM  Calcium Carbonate-Vit D-Min (CALCIUM 1200 PO) [id number]  Take 1 capsule by mouth daily. [provider]  Active   cetirizine (ZYRTEC) 10 MG tablet [id number]  Take 10 mg by mouth daily. [provider]  Active   diltiazem (CARDIZEM) 30 MG tablet [id number]  Take 30 mg by mouth as needed.  [provider]  Active            Med Note ([place] Aug 26, 2020  3:17 PM) Using ~ once monthly for  symptoms of afib  docusate sodium (COLACE) 100 MG capsule [id number]  Take 100 mg by mouth 2 (two) times daily. [provider]  Active   HOMEOPATHIC PRODUCTS PO [id number]  Take 1 tablet by mouth daily. NAD Supplement [provider]  Active   KLOR-CON M20 20 MEQ tablet [id number]  TAKE 1 TABLET BY MOUTH EVERY DAY Crecencio Mc, MD  Active   Melatonin 10 MG CAPS [id number]  Take 1 capsule by mouth daily. [provider]  Active   metFORMIN (GLUCOPHAGE-XR) 500 MG 24 hr tablet [id number]  TAKE 1 TABLET BY MOUTH EVERY DAY WITH BREAKFAST Crecencio Mc, MD  Active   metoprolol succinate (TOPROL-XL) 25 MG 24 hr tablet [id number]  Take 25 mg by mouth daily. [provider]  Active   Multiple Vitamin (MULTIVITAMIN) tablet [id number]  Take 1 tablet by mouth daily. [provider]  Active   Multiple Vitamins-Minerals (HAIR SKIN AND NAILS FORMULA PO) [id number]  Take 3 tablets by mouth daily. [provider]  Active   Probiotic Product (TRUNATURE DIGESTIVE PROBIOTIC) CAPS [id number]  Take by mouth. [provider]  Active   rosuvastatin (CRESTOR) 10 MG tablet [id number] Yes TAKE 1 TABLET BY  MOUTH EVERY DAY Crecencio Mc, MD Taking Active   Semaglutide, 2 MG/DOSE, 8 MG/3ML SOPN [id number] Yes Inject 2 mg as directed once a week. Crecencio Mc, MD Taking Active   spironolactone (ALDACTONE) 25 MG tablet [id number] Yes Take 1 tablet by mouth daily. [provider] Taking Active             Pertinent Labs:   Lab Results  Component Value Date   HGBA1C 5.8 (H) 01/11/2020   Lab Results  Component Value Date   CHOL 185 01/11/2020   HDL 57 01/11/2020   LDLCALC 104 (H) 01/11/2020   LDLDIRECT 146.4 01/11/2013   TRIG 140 01/11/2020   CHOLHDL 3.2 01/11/2020   Lab Results  Component Value Date   CREATININE 0.75 11/17/2020   BUN 12 11/17/2020   NA 135 11/17/2020   K 4.4 11/17/2020   CL 102 11/17/2020   CO2 22 11/17/2020    SDOH:   (Social Determinants of Health) assessments and interventions performed:  SDOH Interventions    Flowsheet Row Most Recent Value  SDOH Interventions   Financial Strain Interventions Intervention Not Indicated       CCM Care Plan  Review of patient past medical history, allergies, medications, health status, including review of consultants reports, laboratory and other test data, was performed as part of comprehensive evaluation and provision of chronic care management services.   Care Plan : Medication Management  Updates made by De Hollingshead, RPH-CPP since 02/03/2021 12:00 AM     Problem: Obesity, Fatty Liver Disease      Long-Range Goal: Disease Progression Progression   Start Date: 08/26/2020  Recent Progress: On track  Priority: High  Note:   Current Barriers:  Unable to achieve control of weight   Pharmacist Clinical Goal(s):  Over the next 90 days, patient will achieve control of weight through collaboration with PharmD and provider.   Interventions: 1:1 collaboration with Crecencio Mc, MD regarding development and update of comprehensive plan of care as evidenced by provider attestation and co-signature Inter-disciplinary care team collaboration (see longitudinal plan of care) Comprehensive medication review performed; medication list updated in electronic medical record  Health Maintenance   Yearly influenza vaccination: up to date - added to chart today Td/Tdap vaccination: up to date Pneumonia vaccination: up to date COVID vaccinations: due - consider bivalent booster moving forward Shingrix vaccinations: due - counseled today, encouraged moving forward  Colonoscopy: up to date Bone density scan: up to date Mammogram: up to date   Obesity, Impaired Fasting Glucose Unable to achieve goal weight loss through diet and exercise alone; current treatment: metformin XR 500 mg daily; Ozempic 2 mg weekly  Denies any GI upset, including nausea, vomiting,  constipation with dose increase Baseline weight: 228.9 lbs; most recent weight: 206 lbs (22 lbs, 10% weight loss thus far), patient's personal goal is 200 lbs Current exercise: apple watch, closes 3 rings daily (movement, standing, exercises); using exercise bike, plans to walk more now that weather is better Current meal patterns: breakfast: greek yogurt, smaller meal with lunch, bigger meal with supper. Focusing on lean proteins, vegetables.  Reports she can tell her exercise tolerance has improved with weight loss. At the beach right now and walking at least 40 minutes daily.  Praised for continued weight loss success. Encouraged continued focus on dietary modifications and increase in physical activity Called pharmacy, cancelled script for Ozempic 0.25/0.5 and 1 mg, asked to fill 2 mg dose. They have canceled scripts, but 2  mg dose is on backorder. Patient declines filling at [facility] and mailing to her. She will call around to local pharmacies and see who has Ozempic 2 mg in stock. [facility] Per patient request, ordered A1c, CMP. Follow for results.   Hx Atrial Fibrillation: Controlled; current treatment: rate control: metoprolol succinate 25 mg daily, anticoagulant therapy: none, though on aspirin 81 mg BID. In NSR per cardiology; diltiazem 30 mg PRN for symptoms of breakthrough palpitations, but notes only needing to take about once per month; additional antihypertensive: spironolactone 25 mg daily  Previously recommended to continue current regimen at this time. Continue to evaluate VTE risk given hx Afib.   Hyperlipidemia: Uncontrolled per last lipid panel but has not been checked since initiation of statin therapy; current treatment: rosuvastatin 10 mg daily Antiplatelet regimen: aspirin 81 mg BID - taking also for arthritis benefit  Scheduled fasting lipid panel. Follow results.   Anxiety/Insomnia: Controlled per patient report; current regimen: alprazolam 0.25 mg  (1/4 of tablet), melatonin 10 mg, bupropion SR 200 mg QAM - not taking second dose later in the day Moving forward, consider XR bupropion if anxiety uncontrolled by only taking 1 dose of SR QAM.  Supplements: MVI, biotin, probiotic, docusate 100 mg BID, cetirizine 10 mg daily, calcium + vitamin D, b complex  Patient Goals/Self-Care Activities Over the next 90 days, patient will:  - take medications as prescribed collaborate with provider on medication access solutions target a minimum of 150 minutes of moderate intensity exercise weekly engage in dietary modifications by reducing carbohydrate portion sizes      Plan: Telephone follow up appointment with care management team member scheduled for:  8 weeks  Catie Darnelle Maffucci, PharmD, [place], CPP Clinical Pharmacist [employer] at [facility] [phone]    I have reviewed the above information and agree with above.   Deborra Medina, MD

## 2021-02-16 DIAGNOSIS — M25511 Pain in right shoulder: Secondary | ICD-10-CM | POA: Diagnosis not present

## 2021-02-18 DIAGNOSIS — I4891 Unspecified atrial fibrillation: Secondary | ICD-10-CM | POA: Diagnosis not present

## 2021-02-18 DIAGNOSIS — E669 Obesity, unspecified: Secondary | ICD-10-CM | POA: Diagnosis not present

## 2021-02-18 DIAGNOSIS — I10 Essential (primary) hypertension: Secondary | ICD-10-CM | POA: Diagnosis not present

## 2021-02-19 DIAGNOSIS — M25511 Pain in right shoulder: Secondary | ICD-10-CM | POA: Diagnosis not present

## 2021-02-21 DIAGNOSIS — N390 Urinary tract infection, site not specified: Secondary | ICD-10-CM | POA: Diagnosis not present

## 2021-02-23 ENCOUNTER — Encounter: Payer: Self-pay | Admitting: Internal Medicine

## 2021-02-23 ENCOUNTER — Other Ambulatory Visit (INDEPENDENT_AMBULATORY_CARE_PROVIDER_SITE_OTHER): Payer: Medicare PPO

## 2021-02-23 ENCOUNTER — Other Ambulatory Visit: Payer: Self-pay

## 2021-02-23 DIAGNOSIS — I10 Essential (primary) hypertension: Secondary | ICD-10-CM | POA: Diagnosis not present

## 2021-02-23 DIAGNOSIS — R7301 Impaired fasting glucose: Secondary | ICD-10-CM | POA: Diagnosis not present

## 2021-02-23 LAB — HEMOGLOBIN A1C: Hgb A1c MFr Bld: 5.8 % (ref 4.6–6.5)

## 2021-02-23 LAB — LIPID PANEL
Cholesterol: 128 mg/dL (ref 0–200)
HDL: 74.7 mg/dL (ref >39.00)
LDL Cholesterol: 42 mg/dL (ref 0–99)
NonHDL: 53.7
Total CHOL/HDL Ratio: 2
Triglycerides: 57 mg/dL (ref 0.0–149.0)
VLDL: 11.4 mg/dL (ref 0.0–40.0)

## 2021-02-23 LAB — COMPREHENSIVE METABOLIC PANEL
ALT: 18 U/L (ref 0–35)
AST: 20 U/L (ref 0–37)
Albumin: 4.5 g/dL (ref 3.5–5.2)
Alkaline Phosphatase: 54 U/L (ref 39–117)
BUN: 12 mg/dL (ref 6–23)
CO2: 27 mEq/L (ref 19–32)
Calcium: 10.1 mg/dL (ref 8.4–10.5)
Chloride: 99 mEq/L (ref 96–112)
Creatinine, Ser: 0.78 mg/dL (ref 0.40–1.20)
GFR: 75.37 mL/min (ref >60.00)
Glucose, Bld: 80 mg/dL (ref 70–99)
Potassium: 4.5 mEq/L (ref 3.5–5.1)
Sodium: 135 mEq/L (ref 135–145)
Total Bilirubin: 0.6 mg/dL (ref 0.2–1.2)
Total Protein: 7.1 g/dL (ref 6.0–8.3)

## 2021-02-23 NOTE — Telephone Encounter (Signed)
Patien twas given Cipro by [facility]  ciprofloxacin HCl (CIPRO) 500 MG tablet   Indications: Acute UTI Take 1 tablet (500 mg total) by mouth 2 (two) times daily for 10 days  Advised patient to early to retest for UTI or to check to see if resolving advised patient I would ask PCP if okay to recheck , patient staed no she would call back after completing antibiotics if symptoms persisted advised probiotic while on ABX and for 2 weeks after as well.

## 2021-03-05 DIAGNOSIS — M25511 Pain in right shoulder: Secondary | ICD-10-CM | POA: Diagnosis not present

## 2021-03-18 ENCOUNTER — Ambulatory Visit (INDEPENDENT_AMBULATORY_CARE_PROVIDER_SITE_OTHER): Payer: Medicare PPO

## 2021-03-18 VITALS — Ht 67.0 in | Wt 206.0 lb

## 2021-03-18 DIAGNOSIS — Z0000 Encounter for general adult medical examination without abnormal findings: Secondary | ICD-10-CM | POA: Diagnosis not present

## 2021-03-18 NOTE — Progress Notes (Addendum)
Subjective:   Heidi Walsh is a 75 y.o. female who presents for Medicare Annual (Subsequent) preventive examination.  Review of Systems    No ROS.  Medicare Wellness Virtual Visit.  Visual/audio telehealth visit, UTA vital signs.   See social history for additional risk factors.   Cardiac Risk Factors include: advanced age (>68men, >4 women);hypertension     Objective:    Today's Vitals   03/18/21 1449  Weight: 206 lb (93.4 kg)  Height: 5\' 7"  (1.702 m)   Body mass index is 32.26 kg/m.  Advanced Directives 03/18/2021 01/17/2020 01/16/2019 12/22/2017 12/21/2016 08/20/2015  Does Patient Have a Medical Advance Directive? Yes Yes Yes Yes Yes Yes  Type of Paramedic of [place];Living will [place];Living will [place];Living will [place];Living will [place];Living will [place];Living will  Does patient want to make changes to medical advance directive? - No - Patient declined No - Patient declined No - Patient declined No - Patient declined No - Patient declined  Copy of [place] in Chart? No - copy requested No - copy requested No - copy requested No - copy requested No - copy requested -    Current Medications (verified) Outpatient Encounter Medications as of 03/18/2021  Medication Sig   ALPRAZolam (XANAX) 0.25 MG tablet TAKE 1 TABLET BY MOUTH TWICE A DAY AS NEEDED FOR ANXIETY   aspirin 81 MG tablet Take 81 mg by mouth 2 (two) times daily.   B Complex Vitamins (VITAMIN-B COMPLEX PO) Take 1 tablet by mouth daily.   buPROPion (WELLBUTRIN SR) 200 MG 12 hr tablet TAKE 1 TABLET BY MOUTH TWICE A DAY   Calcium Carbonate-Vit D-Min (CALCIUM 1200 PO) Take 1 capsule by mouth daily.   cetirizine (ZYRTEC) 10 MG tablet Take 10 mg by mouth daily.   diltiazem (CARDIZEM) 30 MG tablet Take 30 mg by mouth as needed.    docusate sodium (COLACE) 100  MG capsule Take 100 mg by mouth 2 (two) times daily.   HOMEOPATHIC PRODUCTS PO Take 1 tablet by mouth daily. NAD Supplement   KLOR-CON M20 20 MEQ tablet TAKE 1 TABLET BY MOUTH EVERY DAY   Melatonin 10 MG CAPS Take 1 capsule by mouth daily.   metFORMIN (GLUCOPHAGE-XR) 500 MG 24 hr tablet TAKE 1 TABLET BY MOUTH EVERY DAY WITH BREAKFAST   metoprolol succinate (TOPROL-XL) 25 MG 24 hr tablet Take 25 mg by mouth daily.   Multiple Vitamin (MULTIVITAMIN) tablet Take 1 tablet by mouth daily.   Multiple Vitamins-Minerals (HAIR SKIN AND NAILS FORMULA PO) Take 3 tablets by mouth daily.   Probiotic Product (TRUNATURE DIGESTIVE PROBIOTIC) CAPS Take by mouth.   rosuvastatin (CRESTOR) 10 MG tablet TAKE 1 TABLET BY MOUTH EVERY DAY   Semaglutide, 2 MG/DOSE, 8 MG/3ML SOPN Inject 2 mg as directed once a week.   spironolactone (ALDACTONE) 25 MG tablet Take 1 tablet by mouth daily.   No facility-administered encounter medications on file as of 03/18/2021.    Allergies (verified) Oxycodone, Penicillins, Ancef [cefazolin], Cephalexin, and Rivaroxaban   History: Past Medical History:  Diagnosis Date   Actinic keratosis    managed by Darlis Loan   Atrial fibrillation [facility]) 2010   Followed by Dr. Jordan Hawks   Bladder cancer [facility])    Bladder cancer ([place]) 11/18/2016    1 cm papillar tumor covering   left UO Resected at [facility] Sept 6 by Stephens Shire  and ureteral stent placed.  Stent removed sept 20.  Repeat cystoscopy 3 months [facility]     breast cancer 2010   s/p mastectomy   breast cancer    s/p mastectomy    Breast cyst 2003   aspirated, fibroystics breasts with stable calcifications on left breast   Hypertension    Leukopenia    Mitral regurgitation    and prolapse   Osteopenia 56   Stable by repeat scans   Past Surgical History:  Procedure Laterality Date   ATRIAL ABLATION SURGERY  Dec 2012   [facility]   CESAREAN SECTION     x 2   KNEE ARTHROSCOPY W/ MENISCECTOMY  1997   MASTECTOMY  02/2009    Bilateral   MASTECTOMY     Right and Left   SHOULDER SURGERY     left rotator cuff   TOTAL HIP ARTHROPLASTY Right    TOTAL KNEE ARTHROPLASTY  02/2014   Left knee   Family History  Problem Relation Age of Onset   COPD Mother    Hypertension Father    Hepatitis Sister        Hepatitis C from a dental procedure   Social History   Socioeconomic History   Marital status: Married    Spouse name: Not on file   Number of children: Not on file   Years of education: Not on file   Highest education level: Not on file  Occupational History   Not on file  Tobacco Use   Smoking status: Never   Smokeless tobacco: Never  Vaping Use   Vaping Use: Never used  Substance and Sexual Activity   Alcohol use: Yes    Alcohol/week: 0.0 standard drinks    Comment: occ   Drug use: No   Sexual activity: Not on file  Other Topics Concern   Not on file  Social History Narrative   Not on file   Social Determinants of Health   Financial Resource Strain: Low Risk    Difficulty of Paying Living Expenses: Not hard at all  Food Insecurity: No Food Insecurity   Worried About Charity fundraiser in the Last Year: Never true   [place] in the Last Year: Never true  Transportation Needs: No Transportation Needs   Lack of Transportation (Medical): No   Lack of Transportation (Non-Medical): No  Physical Activity: Insufficiently Active   Days of Exercise per Week: 2 days   Minutes of Exercise per Session: 10 min  Stress: No Stress Concern Present   Feeling of Stress : Not at all  Social Connections: Unknown   Frequency of Communication with Friends and Family: Not on file   Frequency of Social Gatherings with Friends and Family: Not on file   Attends Religious Services: Not on Electrical engineer or Organizations: Not on file   Attends Archivist Meetings: Not on file   Marital Status: Married    Tobacco Counseling Counseling given: Not Answered   Clinical  Intake:  Pre-visit preparation completed: Yes        Diabetes: No  How often do you need to have someone help you when you read instructions, pamphlets, or other written materials from your doctor or pharmacy?: 1 - Never    Interpreter Needed?: No      Activities of Daily Living In your present state of health, do you have any difficulty performing the following activities: 03/18/2021  Hearing? N  Vision? N  Difficulty concentrating or making decisions? N  Walking or climbing stairs? N  Dressing or bathing? N  Doing errands, shopping? N  Preparing Food and eating ? N  Using the Toilet? N  In the past six months, have you accidently leaked urine? Y  Comment Stress incontinence  Do you have problems with loss of bowel control? N  Managing your Medications? N  Managing your Finances? N  Housekeeping or managing your Housekeeping? N  Some recent data might be hidden    Patient Care Team: Crecencio Mc, MD as PCP - General (Internal Medicine) De Hollingshead, RPH-CPP (Pharmacist)  Indicate any recent Medical Services you may have received from other than [facility] providers in the past year (date may be approximate).     Assessment:   This is a routine wellness examination for Heidi Walsh.  Virtual Visit via Telephone Note  I connected with  Heidi Walsh on 03/18/21 at  2:45 PM EST by telephone and verified that I am speaking with the correct person using two identifiers.  Persons participating in the virtual visit: patient/Nurse Health Advisor   I discussed the limitations, risks, security and privacy concerns of performing an evaluation and management service by telephone and the availability of in person appointments. The patient expressed understanding and agreed to proceed.  Interactive audio and video telecommunications were attempted between this nurse and patient, however failed, due to patient having technical difficulties OR patient did not have access to  video capability.  We continued and completed visit with audio only.  Some vital signs may be absent or patient reported.   Hearing/Vision screen Hearing Screening - Comments:: Patient is able to hear conversational tones without difficulty. No issues reported.  Vision Screening - Comments:: Followed by [facility]  Wears corrective lenses   Dietary issues and exercise activities discussed: Current Exercise Habits: Home exercise routine, Type of exercise: stretching (Physical therapy once weekly), Time (Minutes): 10, Frequency (Times/Week): 5, Weekly Exercise (Minutes/Week): 50, Intensity: MildLow carb diet Good water intake   Goals Addressed               This Visit's Progress     Patient Stated     Weight (lb) < 190 lb (86.2 kg) (pt-stated)   206 lb (93.4 kg)     I want to walk for exercise Use stationary bike Exercise as tolerated       Depression Screen PHQ 2/9 Scores 03/18/2021 11/17/2020 08/11/2020 02/12/2020 01/17/2020 01/16/2019 12/22/2017  PHQ - 2 Score 0 2 0 0 0 0 0  PHQ- 9 Score - 4 3 5  - - -    Fall Risk Fall Risk  03/18/2021 11/17/2020 08/11/2020 02/12/2020 01/17/2020  Falls in the past year? 0 1 1 1  (No Data)  Comment - - - - None since last reported 1 week ago  Number falls in past yr: - 0 0 0 -  Injury with Fall? - 1 1 1  -  Comment - - - - -  Risk for fall due to : - History of fall(s) - - -  Follow up Falls evaluation completed Falls evaluation completed Falls evaluation completed Falls evaluation completed Falls evaluation completed    [place]: Home free of loose throw rugs in walkways, pet beds, electrical cords, etc? Yes  Adequate lighting in your home to reduce risk of falls? Yes   ASSISTIVE DEVICES UTILIZED TO PREVENT FALLS: Use of a cane, walker or w/c? No  TIMED UP AND GO: Was the test performed? No .   Cognitive Function: Patient is alert and oriented x3.  Enjoys playing piano and guitar.   Manages her own finances and medications.   MMSE - Mini Mental State Exam 12/22/2017 12/21/2016  Orientation to time 5 5  Orientation to Place 5 5  Registration 3 3  Attention/ Calculation 5 5  Recall 3 3  Language- name 2 objects 2 2  Language- repeat 1 1  Language- follow 3 step command 3 3  Language- read & follow direction 1 1  Write a sentence 1 1  Copy design 1 1  Total score 30 30     6CIT Screen 01/16/2019  What Year? 0 points  What month? 0 points  What time? 0 points  Count back from 20 0 points  Months in reverse 0 points  Repeat phrase 0 points  Total Score 0   Immunizations Immunization History  Administered Date(s) Administered   Fluad Quad(high Dose 65+) 01/15/2019   Influenza Split 02/23/2011, 02/20/2012   Influenza Whole 02/04/2016   Influenza, High Dose Seasonal PF 01/01/2015, 02/06/2020, 12/25/2020   Influenza,inj,Quad PF,6+ Mos 12/25/2012   Influenza,inj,quad, With Preservative 12/20/2016, 12/20/2017   Influenza-Unspecified 12/16/2016, 01/03/2018   Moderna Sars-Covid-2 Vaccination 05/02/2019, 05/30/2019, 01/14/2020, 10/17/2020   PPD Test 04/25/2019   Pneumococcal Conjugate-13 09/20/2013   Pneumococcal Polysaccharide-23 03/19/2014   Tdap 11/24/2006, 12/27/2013   Zoster, Live 12/26/2007   Shingrix Completed?: No.    Education has been provided regarding the importance of this vaccine. Patient has been advised to call insurance company to determine out of pocket expense if they have not yet received this vaccine. Advised may also receive vaccine at local pharmacy or Health Dept. Verbalized acceptance and understanding.  Screening Tests Health Maintenance  Topic Date Due   Zoster Vaccines- Shingrix (1 of 2) 06/16/2021 (Originally 10/09/1964)   COLONOSCOPY (Pts 45-23yrs Insurance coverage will need to be confirmed)  10/28/2022   TETANUS/TDAP  12/28/2023   Pneumonia Vaccine 30+ Years old  Completed   INFLUENZA VACCINE  Completed   DEXA SCAN  Completed    COVID-19 Vaccine  Completed   Hepatitis C Screening  Completed   HPV VACCINES  Aged Out   MAMMOGRAM  Discontinued   Health Maintenance There are no preventive care reminders to display for this patient.  Lung Cancer Screening: (Low Dose CT Chest recommended if Age 64-80 years, 30 pack-year currently smoking OR have quit w/in 15years.) does not qualify.   Vision Screening: Recommended annual ophthalmology exams for early detection of glaucoma and other disorders of the eye.  Dental Screening: Recommended annual dental exams for proper oral hygiene  Community Resource Referral / Chronic Care Management: CRR required this visit?  No   CCM required this visit?  No      Plan:   Keep all routine maintenance appointments.   I have personally reviewed and noted the following in the patients chart:   Medical and social history Use of alcohol, tobacco or illicit drugs  Current medications and supplements including opioid prescriptions. Not taking opioid.  Functional ability and status Nutritional status Physical activity Advanced directives List of other physicians Hospitalizations, surgeries, and ER visits in previous 12 months Vitals Screenings to include cognitive, depression, and falls Referrals and appointments  In addition, I have reviewed and discussed with patient certain preventive protocols, quality metrics, and best practice recommendations. A written personalized care plan for preventive services as well as general preventive health recommendations were  provided to patient.     OBrien-Blaney, Latausha Flamm L, LPN   [id number]     I have reviewed the above information and agree with above.   Deborra Medina, MD

## 2021-03-18 NOTE — Patient Instructions (Addendum)
Heidi Walsh , Thank you for taking time to come for your Medicare Wellness Visit. I appreciate your ongoing commitment to your health goals. Please review the following plan we discussed and let me know if I can assist you in the future.   These are the goals we discussed:  Goals       Patient Stated     Medication Management (pt-stated)      Patient Goals/Self-Care Activities Over the next 90 days, patient will:  - take medications as prescribed collaborate with provider on medication access solutions target a minimum of 150 minutes of moderate intensity exercise weekly engage in dietary modifications by reducing carbohydrate portion sizes       Weight (lb) < 190 lb (86.2 kg) (pt-stated)      I want to walk for exercise Use stationary bike Exercise as tolerated        This is a list of the screening recommended for you and due dates:  Health Maintenance  Topic Date Due   Zoster (Shingles) Vaccine (1 of 2) 06/16/2021*   Colon Cancer Screening  10/28/2022   Tetanus Vaccine  12/28/2023   Pneumonia Vaccine  Completed   Flu Shot  Completed   DEXA scan (bone density measurement)  Completed   COVID-19 Vaccine  Completed   Hepatitis C Screening: USPSTF Recommendation to screen - Ages 25-79 yo.  Completed   HPV Vaccine  Aged Out   Mammogram  Discontinued  *Topic was postponed. The date shown is not the original due date.    Advanced directives: End of life planning; Advance aging; Advanced directives discussed.  Copy of current HCPOA/Living Will requested.    Conditions/risks identified: none new  Follow up in one year for your annual wellness visit    Preventive Care 65 Years and Older, Female Preventive care refers to lifestyle choices and visits with your health care provider that can promote health and wellness. What does preventive care include? A yearly physical exam. This is also called an annual well check. Dental exams once or twice a year. Routine eye exams.  Ask your health care provider how often you should have your eyes checked. Personal lifestyle choices, including: Daily care of your teeth and gums. Regular physical activity. Eating a healthy diet. Avoiding tobacco and drug use. Limiting alcohol use. Practicing safe sex. Taking low-dose aspirin every day. Taking vitamin and mineral supplements as recommended by your health care provider. What happens during an annual well check? The services and screenings done by your health care provider during your annual well check will depend on your age, overall health, lifestyle risk factors, and family history of disease. Counseling  Your health care provider may ask you questions about your: Alcohol use. Tobacco use. Drug use. Emotional well-being. Home and relationship well-being. Sexual activity. Eating habits. History of falls. Memory and ability to understand (cognition). Work and work Statistician. Reproductive health. Screening  You may have the following tests or measurements: Height, weight, and BMI. Blood pressure. Lipid and cholesterol levels. These may be checked every 5 years, or more frequently if you are over 25 years old. Skin check. Lung cancer screening. You may have this screening every year starting at age 40 if you have a 30-pack-year history of smoking and currently smoke or have quit within the past 15 years. Fecal occult blood test (FOBT) of the stool. You may have this test every year starting at age 70. Flexible sigmoidoscopy or colonoscopy. You may have a sigmoidoscopy every 5  years or a colonoscopy every 10 years starting at age 39. Hepatitis C blood test. Hepatitis B blood test. Sexually transmitted disease (STD) testing. Diabetes screening. This is done by checking your blood sugar (glucose) after you have not eaten for a while (fasting). You may have this done every 1-3 years. Bone density scan. This is done to screen for osteoporosis. You may have this  done starting at age 21. Mammogram. This may be done every 1-2 years. Talk to your health care provider about how often you should have regular mammograms. Talk with your health care provider about your test results, treatment options, and if necessary, the need for more tests. Vaccines  Your health care provider may recommend certain vaccines, such as: Influenza vaccine. This is recommended every year. Tetanus, diphtheria, and acellular pertussis (Tdap, Td) vaccine. You may need a Td booster every 10 years. Zoster vaccine. You may need this after age 79. Pneumococcal 13-valent conjugate (PCV13) vaccine. One dose is recommended after age 72. Pneumococcal polysaccharide (PPSV23) vaccine. One dose is recommended after age 67. Talk to your health care provider about which screenings and vaccines you need and how often you need them. This information is not intended to replace advice given to you by your health care provider. Make sure you discuss any questions you have with your health care provider. Document Released: 04/04/2015 Document Revised: 11/26/2015 Document Reviewed: 01/07/2015 Elsevier Interactive Patient Education  2017 [place] Prevention in the Home Falls can cause injuries. They can happen to people of all ages. There are many things you can do to make your home safe and to help prevent falls. What can I do on the outside of my home? Regularly fix the edges of walkways and driveways and fix any cracks. Remove anything that might make you trip as you walk through a door, such as a raised step or threshold. Trim any bushes or trees on the path to your home. Use bright outdoor lighting. Clear any walking paths of anything that might make someone trip, such as rocks or tools. Regularly check to see if handrails are loose or broken. Make sure that both sides of any steps have handrails. Any raised decks and porches should have guardrails on the edges. Have any leaves,  snow, or ice cleared regularly. Use sand or salt on walking paths during winter. Clean up any spills in your garage right away. This includes oil or grease spills. What can I do in the bathroom? Use night lights. Install grab bars by the toilet and in the tub and shower. Do not use towel bars as grab bars. Use non-skid mats or decals in the tub or shower. If you need to sit down in the shower, use a plastic, non-slip stool. Keep the floor dry. Clean up any water that spills on the floor as soon as it happens. Remove soap buildup in the tub or shower regularly. Attach bath mats securely with double-sided non-slip rug tape. Do not have throw rugs and other things on the floor that can make you trip. What can I do in the bedroom? Use night lights. Make sure that you have a light by your bed that is easy to reach. Do not use any sheets or blankets that are too big for your bed. They should not hang down onto the floor. Have a firm chair that has side arms. You can use this for support while you get dressed. Do not have throw rugs and other things on the  floor that can make you trip. What can I do in the kitchen? Clean up any spills right away. Avoid walking on wet floors. Keep items that you use a lot in easy-to-reach places. If you need to reach something above you, use a strong step stool that has a grab bar. Keep electrical cords out of the way. Do not use floor polish or wax that makes floors slippery. If you must use wax, use non-skid floor wax. Do not have throw rugs and other things on the floor that can make you trip. What can I do with my stairs? Do not leave any items on the stairs. Make sure that there are handrails on both sides of the stairs and use them. Fix handrails that are broken or loose. Make sure that handrails are as long as the stairways. Check any carpeting to make sure that it is firmly attached to the stairs. Fix any carpet that is loose or worn. Avoid having throw  rugs at the top or bottom of the stairs. If you do have throw rugs, attach them to the floor with carpet tape. Make sure that you have a light switch at the top of the stairs and the bottom of the stairs. If you do not have them, ask someone to add them for you. What else can I do to help prevent falls? Wear shoes that: Do not have high heels. Have rubber bottoms. Are comfortable and fit you well. Are closed at the toe. Do not wear sandals. If you use a stepladder: Make sure that it is fully opened. Do not climb a closed stepladder. Make sure that both sides of the stepladder are locked into place. Ask someone to hold it for you, if possible. Clearly mark and make sure that you can see: Any grab bars or handrails. First and last steps. Where the edge of each step is. Use tools that help you move around (mobility aids) if they are needed. These include: Canes. Walkers. Scooters. Crutches. Turn on the lights when you go into a dark area. Replace any light bulbs as soon as they burn out. Set up your furniture so you have a clear path. Avoid moving your furniture around. If any of your floors are uneven, fix them. If there are any pets around you, be aware of where they are. Review your medicines with your doctor. Some medicines can make you feel dizzy. This can increase your chance of falling. Ask your doctor what other things that you can do to help prevent falls. This information is not intended to replace advice given to you by your health care provider. Make sure you discuss any questions you have with your health care provider. Document Released: 01/02/2009 Document Revised: 08/14/2015 Document Reviewed: 04/12/2014 Elsevier Interactive Patient Education  2017 [employer].

## 2021-03-19 DIAGNOSIS — M25511 Pain in right shoulder: Secondary | ICD-10-CM | POA: Diagnosis not present

## 2021-03-21 ENCOUNTER — Other Ambulatory Visit: Payer: Self-pay | Admitting: Internal Medicine

## 2021-03-24 ENCOUNTER — Ambulatory Visit (INDEPENDENT_AMBULATORY_CARE_PROVIDER_SITE_OTHER): Payer: Medicare PPO | Admitting: Pharmacist

## 2021-03-24 VITALS — Wt 203.0 lb

## 2021-03-24 DIAGNOSIS — K760 Fatty (change of) liver, not elsewhere classified: Secondary | ICD-10-CM

## 2021-03-24 DIAGNOSIS — E669 Obesity, unspecified: Secondary | ICD-10-CM

## 2021-03-24 DIAGNOSIS — I10 Essential (primary) hypertension: Secondary | ICD-10-CM

## 2021-03-24 DIAGNOSIS — I4891 Unspecified atrial fibrillation: Secondary | ICD-10-CM

## 2021-03-24 DIAGNOSIS — I700 Atherosclerosis of aorta: Secondary | ICD-10-CM

## 2021-03-24 MED ORDER — ROSUVASTATIN CALCIUM 10 MG PO TABS: 10.0000 mg | ORAL_TABLET | Freq: Every day | ORAL | 1 refills | Status: DC

## 2021-03-24 NOTE — Chronic Care Management (AMB) (Signed)
Chronic Care Management CCM Pharmacy Note  03/24/2021 Name:  Heidi Walsh MRN:  [id number] DOB:  06-16-1945  Summary: - Tolerating regimen well - Lipids at goal on rosuvastatin therapy  Recommendations/Changes made from today's visit: - Continue current regimen at this time - Scheduled PCP f/u and fasting labs - Encouraged Shingrix vaccine series  Subjective: Heidi Walsh is an 76 y.o. year old female who is a primary patient of Derrel Nip, Aris Everts, MD.  The CCM team was consulted for assistance with disease management and care coordination needs.    Engaged with patient by telephone for follow up visit for pharmacy case management and/or care coordination services.   Objective:  Medications Reviewed Today     Reviewed by De Hollingshead, RPH-CPP (Pharmacist) on 03/24/21 at [place] List Status: <None>   Medication Order Taking? Sig Documenting Provider Last Dose Status Informant  ALPRAZolam (XANAX) 0.25 MG tablet [id number]  TAKE 1 TABLET BY MOUTH TWICE A DAY AS NEEDED FOR ANXIETY Crecencio Mc, MD  Active            Med Note De Hollingshead   Tue Aug 26, 2020  3:11 PM) Taking 1/4 tablet QPM PRN   aspirin 81 MG tablet [id number]  Take 81 mg by mouth 2 (two) times daily. [provider]  Active   B Complex Vitamins (VITAMIN-B COMPLEX PO) [id number]  Take 1 tablet by mouth daily. [provider]  Active   buPROPion [facility] SR) 200 MG 12 hr tablet [id number]  TAKE 1 TABLET BY MOUTH TWICE A DAY Crecencio Mc, MD  Active            Med Note De Hollingshead   Tue Aug 26, 2020  3:14 PM) Taking 1 QAM  Calcium Carbonate-Vit D-Min (CALCIUM 1200 PO) [id number]  Take 1 capsule by mouth daily. [provider]  Active   cetirizine (ZYRTEC) 10 MG tablet [id number]  Take 10 mg by mouth daily. [provider]  Active   diltiazem (CARDIZEM) 30 MG tablet [id number]  Take 30 mg by mouth as needed.  [provider]  Active             Med Note ([place] Aug 26, 2020  3:17 PM) Using ~ once monthly for symptoms of afib  docusate sodium (COLACE) 100 MG capsule [id number]  Take 100 mg by mouth 2 (two) times daily. [provider]  Active   HOMEOPATHIC PRODUCTS PO [id number]  Take 1 tablet by mouth daily. NAD Supplement [provider]  Active   KLOR-CON M20 20 MEQ tablet [id number]  TAKE 1 TABLET BY MOUTH EVERY DAY Crecencio Mc, MD  Active   Melatonin 10 MG CAPS [id number]  Take 1 capsule by mouth daily. [provider]  Active   metFORMIN (GLUCOPHAGE-XR) 500 MG 24 hr tablet [id number]  TAKE 1 TABLET BY MOUTH EVERY DAY WITH BREAKFAST Crecencio Mc, MD  Active   metoprolol succinate (TOPROL-XL) 25 MG 24 hr tablet [id number]  Take 25 mg by mouth daily. [provider]  Active   Multiple Vitamin (MULTIVITAMIN) tablet [id number]  Take 1 tablet by mouth daily. [provider]  Active   Multiple Vitamins-Minerals (HAIR SKIN AND NAILS FORMULA PO) [id number]  Take 3 tablets by mouth daily. [provider]  Active   Probiotic Product (TRUNATURE DIGESTIVE PROBIOTIC) CAPS [id number]  Take by mouth. [provider]  Active   rosuvastatin (CRESTOR)  10 MG tablet [id number]  TAKE 1 TABLET BY MOUTH EVERY DAY Crecencio Mc, MD  Active   Semaglutide, 2 MG/DOSE, 8 MG/3ML SOPN [id number]  Inject 2 mg as directed once a week. Crecencio Mc, MD  Active   spironolactone (ALDACTONE) 25 MG tablet [id number]  Take 1 tablet by mouth daily. [provider]  Expired 03/04/21 2359             Pertinent Labs:   Lab Results  Component Value Date   HGBA1C 5.8 02/23/2021   Lab Results  Component Value Date   CHOL 128 02/23/2021   HDL 74.70 02/23/2021   LDLCALC 42 02/23/2021   LDLDIRECT 146.4 01/11/2013   TRIG 57.0 02/23/2021   CHOLHDL 2 02/23/2021   Lab Results  Component Value Date   CREATININE 0.78 02/23/2021   BUN 12 02/23/2021   NA 135 02/23/2021   K 4.5  02/23/2021   CL 99 02/23/2021   CO2 27 02/23/2021    SDOH:  (Social Determinants of Health) assessments and interventions performed:  SDOH Interventions    Flowsheet Row Most Recent Value  SDOH Interventions   Financial Strain Interventions Intervention Not Indicated       CCM Care Plan  Review of patient past medical history, allergies, medications, health status, including review of consultants reports, laboratory and other test data, was performed as part of comprehensive evaluation and provision of chronic care management services.   Care Plan : Medication Management  Updates made by De Hollingshead, RPH-CPP since 03/24/2021 12:00 AM     Problem: Obesity, Fatty Liver Disease      Long-Range Goal: Disease Progression Progression   Start Date: 08/26/2020  Recent Progress: On track  Priority: High  Note:   Current Barriers:  Unable to achieve control of weight   Pharmacist Clinical Goal(s):  Over the next 90 days, patient will achieve control of weight through collaboration with PharmD and provider.   Interventions: 1:1 collaboration with Crecencio Mc, MD regarding development and update of comprehensive plan of care as evidenced by provider attestation and co-signature Inter-disciplinary care team collaboration (see longitudinal plan of care) Comprehensive medication review performed; medication list updated in electronic medical record  Health Maintenance   Yearly influenza vaccination: up to date  Td/Tdap vaccination: up to date Pneumonia vaccination: up to date COVID vaccinations: up to date  Shingrix vaccinations: due - encouraged to pursue this year Colonoscopy: up to date Bone density scan: up to date Mammogram: up to date   Obesity, Impaired Fasting Glucose Unable to achieve goal weight loss through diet and exercise alone; current treatment: metformin XR 500 mg daily; Ozempic 2 mg weekly  Baseline weight: 228.9 lbs; most recent weight: 203 lbs (26  lbs, 11% weight loss thus far), patient's personal goal is 200 lbs; Current exercise: apple watch, closes 3 rings daily (movement, standing, exercises); using exercise bike, plans to walk more now that weather is better. Reports overall better mental health with this weight loss. Better able to focus on long term goals.  Current meal patterns: reports she continues to moderate portion sizes, even through the holidays.  Praised for continued success with weight management, maintenance of A1c control. Recommended to continue current regimen at this time.   Hx Atrial Fibrillation: Controlled; current treatment: rate control: metoprolol succinate 25 mg daily, anticoagulant therapy: none, though on aspirin 81 mg BID. In NSR per cardiology; diltiazem 30 mg PRN for symptoms of breakthrough palpitations, but notes only needing to  take about once per month;  Additional antihypertensive: spironolactone 25 mg daily  Previously recommended to continue current regimen at this time. Continue to evaluate VTE risk given hx Afib. Upcoming follow up with Dr. Ubaldo Glassing  Hyperlipidemia: Controlled (LDL demonstrated 60% loss from baseline); current treatment: rosuvastatin 10 mg daily Antiplatelet regimen: aspirin 81 mg BID - taking also for arthritis benefit  Praised for attainment of goal lipids. Recommended to continue current regimen at this time  Anxiety/Insomnia: Controlled per patient report; current regimen: alprazolam 0.25 mg (1/4 of tablet), melatonin 10 mg, bupropion SR 200 mg QAM - not taking second dose later in the day Moving forward, consider XR bupropion if anxiety uncontrolled by only taking 1 dose of SR QAM.  Supplements: MVI, biotin, probiotic, docusate 100 mg BID, cetirizine 10 mg daily, calcium + vitamin D, b complex  Patient Goals/Self-Care Activities Over the next 90 days, patient will:  - take medications as prescribed collaborate with provider on medication access solutions target a minimum  of 150 minutes of moderate intensity exercise weekly engage in dietary modifications by reducing carbohydrate portion sizes      Plan: Telephone follow up appointment with care management team member scheduled for:  2 months  Catie Darnelle Maffucci, PharmD, [place], CPP Clinical Pharmacist [employer] at [employer] [phone]

## 2021-03-24 NOTE — Addendum Note (Signed)
Addended by: De Hollingshead on: 03/24/2021 11:24 AM   Modules accepted: Orders

## 2021-03-24 NOTE — Patient Instructions (Addendum)
Heidi Walsh,   Keep up the fantastic work!  We recommend the Shingrix (shingles) vaccine series for all over age 74. It should have a $0 copay on all Medicare plans this year. You can pursue this without a prescription at your local pharmacy, or feel free to call our [place] at [facility] at [phone] to schedule to get this.    Take care!  Catie Darnelle Maffucci, PharmD   Visit Information  Following are the goals we discussed today:  Patient Goals/Self-Care Activities Over the next 90 days, patient will:  - take medications as prescribed collaborate with provider on medication access solutions target a minimum of 150 minutes of moderate intensity exercise weekly engage in dietary modifications by reducing carbohydrate portion sizes        Plan: Telephone follow up appointment with care management team member scheduled for:  2 months   Catie Darnelle Maffucci, PharmD, [place], CPP Clinical Pharmacist [place] at [facility] [phone]     Please call the care guide team at [phone] if you need to cancel or reschedule your appointment.   Patient verbalizes understanding of instructions provided today and agrees to view in [place].

## 2021-03-26 DIAGNOSIS — M25511 Pain in right shoulder: Secondary | ICD-10-CM | POA: Diagnosis not present

## 2021-04-02 ENCOUNTER — Other Ambulatory Visit: Payer: Self-pay | Admitting: Internal Medicine

## 2021-04-02 DIAGNOSIS — M25511 Pain in right shoulder: Secondary | ICD-10-CM | POA: Diagnosis not present

## 2021-04-08 DIAGNOSIS — I700 Atherosclerosis of aorta: Secondary | ICD-10-CM | POA: Diagnosis not present

## 2021-04-08 DIAGNOSIS — I10 Essential (primary) hypertension: Secondary | ICD-10-CM | POA: Diagnosis not present

## 2021-04-08 DIAGNOSIS — I480 Paroxysmal atrial fibrillation: Secondary | ICD-10-CM | POA: Diagnosis not present

## 2021-04-09 DIAGNOSIS — M25511 Pain in right shoulder: Secondary | ICD-10-CM | POA: Diagnosis not present

## 2021-04-16 DIAGNOSIS — M25511 Pain in right shoulder: Secondary | ICD-10-CM | POA: Diagnosis not present

## 2021-04-21 DIAGNOSIS — I4891 Unspecified atrial fibrillation: Secondary | ICD-10-CM

## 2021-04-21 DIAGNOSIS — I10 Essential (primary) hypertension: Secondary | ICD-10-CM | POA: Diagnosis not present

## 2021-04-24 ENCOUNTER — Other Ambulatory Visit: Payer: Self-pay | Admitting: Internal Medicine

## 2021-04-24 DIAGNOSIS — M25511 Pain in right shoulder: Secondary | ICD-10-CM | POA: Diagnosis not present

## 2021-04-30 DIAGNOSIS — C44622 Squamous cell carcinoma of skin of right upper limb, including shoulder: Secondary | ICD-10-CM | POA: Diagnosis not present

## 2021-04-30 DIAGNOSIS — L718 Other rosacea: Secondary | ICD-10-CM | POA: Diagnosis not present

## 2021-04-30 DIAGNOSIS — Z85820 Personal history of malignant melanoma of skin: Secondary | ICD-10-CM | POA: Diagnosis not present

## 2021-04-30 DIAGNOSIS — Z872 Personal history of diseases of the skin and subcutaneous tissue: Secondary | ICD-10-CM | POA: Diagnosis not present

## 2021-04-30 DIAGNOSIS — Z859 Personal history of malignant neoplasm, unspecified: Secondary | ICD-10-CM | POA: Diagnosis not present

## 2021-04-30 DIAGNOSIS — D485 Neoplasm of uncertain behavior of skin: Secondary | ICD-10-CM | POA: Diagnosis not present

## 2021-04-30 DIAGNOSIS — L821 Other seborrheic keratosis: Secondary | ICD-10-CM | POA: Diagnosis not present

## 2021-04-30 DIAGNOSIS — Z85828 Personal history of other malignant neoplasm of skin: Secondary | ICD-10-CM | POA: Diagnosis not present

## 2021-04-30 DIAGNOSIS — L578 Other skin changes due to chronic exposure to nonionizing radiation: Secondary | ICD-10-CM | POA: Diagnosis not present

## 2021-04-30 DIAGNOSIS — Z86018 Personal history of other benign neoplasm: Secondary | ICD-10-CM | POA: Diagnosis not present

## 2021-05-01 DIAGNOSIS — M25511 Pain in right shoulder: Secondary | ICD-10-CM | POA: Diagnosis not present

## 2021-05-07 DIAGNOSIS — M25511 Pain in right shoulder: Secondary | ICD-10-CM | POA: Diagnosis not present

## 2021-05-13 ENCOUNTER — Other Ambulatory Visit: Payer: Self-pay | Admitting: Internal Medicine

## 2021-05-21 ENCOUNTER — Telehealth: Payer: Medicare PPO

## 2021-05-25 ENCOUNTER — Telehealth: Payer: Medicare PPO

## 2021-05-25 DIAGNOSIS — C44622 Squamous cell carcinoma of skin of right upper limb, including shoulder: Secondary | ICD-10-CM | POA: Diagnosis not present

## 2021-05-28 ENCOUNTER — Ambulatory Visit: Payer: Self-pay | Admitting: Pharmacist

## 2021-05-28 NOTE — Chronic Care Management (AMB) (Signed)
?  Chronic Care Management  ? ?Note ? ?05/28/2021 ?Name: Heidi Walsh MRN: [id number] DOB: 07-Apr-1945 ? ? ? ?Closing pharmacy CCM case at this time. Patient has clinic contact information for future questions or concerns.  ? ?Catie Darnelle Maffucci, PharmD, [place], CPP ?Clinical Pharmacist ?Therapist, music at [facility] ?[phone] ? ?

## 2021-05-28 NOTE — Patient Instructions (Signed)
Hi Fianna,  ? ?I am being asked to quickly transition into another role within the health system, so unfortunately I am unable to keep our next appointment. Please continue to follow up with your primary care provider as scheduled.  ? ?It has been a pleasure working with you! ? ?Catie Darnelle Maffucci, PharmD ? ?

## 2021-06-09 DIAGNOSIS — L570 Actinic keratosis: Secondary | ICD-10-CM | POA: Diagnosis not present

## 2021-06-11 ENCOUNTER — Other Ambulatory Visit (INDEPENDENT_AMBULATORY_CARE_PROVIDER_SITE_OTHER): Payer: Medicare PPO

## 2021-06-11 ENCOUNTER — Other Ambulatory Visit: Payer: Self-pay

## 2021-06-11 DIAGNOSIS — I700 Atherosclerosis of aorta: Secondary | ICD-10-CM | POA: Diagnosis not present

## 2021-06-11 DIAGNOSIS — I4891 Unspecified atrial fibrillation: Secondary | ICD-10-CM

## 2021-06-11 DIAGNOSIS — K760 Fatty (change of) liver, not elsewhere classified: Secondary | ICD-10-CM | POA: Diagnosis not present

## 2021-06-11 DIAGNOSIS — I10 Essential (primary) hypertension: Secondary | ICD-10-CM | POA: Diagnosis not present

## 2021-06-11 LAB — LIPID PANEL
Cholesterol: 109 mg/dL (ref 0–200)
HDL: 57.9 mg/dL (ref >39.00)
LDL Cholesterol: 39 mg/dL (ref 0–99)
NonHDL: 50.76
Total CHOL/HDL Ratio: 2
Triglycerides: 60 mg/dL (ref 0.0–149.0)
VLDL: 12 mg/dL (ref 0.0–40.0)

## 2021-06-11 LAB — COMPREHENSIVE METABOLIC PANEL
ALT: 17 U/L (ref 0–35)
AST: 20 U/L (ref 0–37)
Albumin: 4.4 g/dL (ref 3.5–5.2)
Alkaline Phosphatase: 58 U/L (ref 39–117)
BUN: 17 mg/dL (ref 6–23)
CO2: 25 mEq/L (ref 19–32)
Calcium: 9.4 mg/dL (ref 8.4–10.5)
Chloride: 103 mEq/L (ref 96–112)
Creatinine, Ser: 0.69 mg/dL (ref 0.40–1.20)
GFR: 85.94 mL/min (ref >60.00)
Glucose, Bld: 96 mg/dL (ref 70–99)
Potassium: 4.4 mEq/L (ref 3.5–5.1)
Sodium: 135 mEq/L (ref 135–145)
Total Bilirubin: 0.5 mg/dL (ref 0.2–1.2)
Total Protein: 6.8 g/dL (ref 6.0–8.3)

## 2021-06-11 LAB — CBC WITH DIFFERENTIAL/PLATELET
Basophils Absolute: 0.1 10*3/uL (ref 0.0–0.1)
Basophils Relative: 1.2 % (ref 0.0–3.0)
Eosinophils Absolute: 0.2 10*3/uL (ref 0.0–0.7)
Eosinophils Relative: 4.5 % (ref 0.0–5.0)
HCT: 39.4 % (ref 36.0–46.0)
Hemoglobin: 13.4 g/dL (ref 12.0–15.0)
Lymphocytes Relative: 28.4 % (ref 12.0–46.0)
Lymphs Abs: 1.3 10*3/uL (ref 0.7–4.0)
MCHC: 34 g/dL (ref 30.0–36.0)
MCV: 96.7 fl (ref 78.0–100.0)
Monocytes Absolute: 0.5 10*3/uL (ref 0.1–1.0)
Monocytes Relative: 11.3 % (ref 3.0–12.0)
Neutro Abs: 2.5 10*3/uL (ref 1.4–7.7)
Neutrophils Relative %: 54.6 % (ref 43.0–77.0)
Platelets: 217 10*3/uL (ref 150.0–400.0)
RBC: 4.08 Mil/uL (ref 3.87–5.11)
RDW: 12.8 % (ref 11.5–15.5)
WBC: 4.6 10*3/uL (ref 4.0–10.5)

## 2021-06-22 ENCOUNTER — Ambulatory Visit: Payer: Medicare PPO | Admitting: Internal Medicine

## 2021-07-03 ENCOUNTER — Ambulatory Visit: Payer: Medicare PPO | Admitting: Internal Medicine

## 2021-07-06 ENCOUNTER — Ambulatory Visit: Payer: Medicare PPO | Admitting: Internal Medicine

## 2021-07-06 ENCOUNTER — Ambulatory Visit (INDEPENDENT_AMBULATORY_CARE_PROVIDER_SITE_OTHER): Payer: Medicare PPO

## 2021-07-06 ENCOUNTER — Encounter: Payer: Self-pay | Admitting: Internal Medicine

## 2021-07-06 VITALS — BP 114/80 | HR 91 | Temp 98.2°F | Ht 67.0 in | Wt 198.4 lb

## 2021-07-06 DIAGNOSIS — F320 Major depressive disorder, single episode, mild: Secondary | ICD-10-CM | POA: Diagnosis not present

## 2021-07-06 DIAGNOSIS — M7989 Other specified soft tissue disorders: Secondary | ICD-10-CM

## 2021-07-06 DIAGNOSIS — R7303 Prediabetes: Secondary | ICD-10-CM

## 2021-07-06 DIAGNOSIS — M79675 Pain in left toe(s): Secondary | ICD-10-CM

## 2021-07-06 DIAGNOSIS — I4891 Unspecified atrial fibrillation: Secondary | ICD-10-CM | POA: Diagnosis not present

## 2021-07-06 DIAGNOSIS — J479 Bronchiectasis, uncomplicated: Secondary | ICD-10-CM

## 2021-07-06 DIAGNOSIS — I700 Atherosclerosis of aorta: Secondary | ICD-10-CM | POA: Diagnosis not present

## 2021-07-06 LAB — POCT GLYCOSYLATED HEMOGLOBIN (HGB A1C): Hemoglobin A1C: 5.2 % (ref 4.0–5.6)

## 2021-07-06 MED ORDER — SPIRONOLACTONE 25 MG PO TABS: 25.0000 mg | ORAL_TABLET | Freq: Every day | ORAL | 3 refills | Status: DC

## 2021-07-06 NOTE — Assessment & Plan Note (Addendum)
She has lost 30 lbs since May 2022 with lifestyle changes and addition of  Ozempic  To metformin  for appetite suppression.  limiting carbs . First goal met (<200).  Exercising regularly . Weight plateaued but has lost 6 lbs by changing routine.  Has  been at same weight now for the plast 10 days  ?

## 2021-07-06 NOTE — Patient Instructions (Addendum)
You look fantastic!  I am very impressed with your progress ? ?If you want to look at the [employer],  you can go online and read about it.  It is 'HANDS DOWN"  THE MOST EFFECTIVE WEIGHT PLAN I have seen in the last ten years ? ? ?

## 2021-07-06 NOTE — Assessment & Plan Note (Signed)
Reviewed findings of prior CT scan today..  Patient is tolerating high potency statin therapy  

## 2021-07-06 NOTE — Assessment & Plan Note (Signed)
Managed with wellbutrin .  Asymptomatic prefers to remain on medication  ?

## 2021-07-06 NOTE — Progress Notes (Signed)
? ?Subjective:  ?Patient ID: Heidi Walsh, female    DOB: 24-Jul-1945  Age: 76 y.o. MRN: [id number] ? ?CC: The primary encounter diagnosis was Prediabetes. Diagnoses of Atrial fibrillation, unspecified type ([place]), Bronchiectasis without complication ([place]), Current mild episode of major depressive disorder without prior episode ([place]), Morbid obesity ([place]), Pain and swelling of toe, left, and Thoracic aortic atherosclerosis ([place]) were also pertinent to this visit. ? ? ?This visit occurred during the SARS-CoV-2 public health emergency.  Safety protocols were in place, including screening questions prior to the visit, additional usage of staff PPE, and extensive cleaning of exam room while observing appropriate contact time as indicated for disinfecting solutions.   ? ?HPI ?Heidi Walsh presents for  ?Chief Complaint  ?Patient presents with  ? Follow-up  ?  6 month follow up   ? ?1) left 5th toe pain .  [place] .  Thinks she has a stress fracture  ? ?2) Reviewed findings of prior CT scan today..  Patient is tolerating high potency statin therapy  with crestor 10 mg  ? ?3) obesity:  has lost 30 lbs using lifestyle changes, including increased activity and caloric restriction.  Using a rebounder and stationery bike,  has added  Track sticks with walking which has allowed her to walk longer distances and up hills without stopping  ? ?Outpatient Medications Prior to Visit  ?Medication Sig Dispense Refill  ? ALPRAZolam (XANAX) 0.25 MG tablet TAKE 1 TABLET BY MOUTH TWICE A DAY AS NEEDED FOR ANXIETY 60 tablet 1  ? aspirin 81 MG tablet Take 81 mg by mouth 2 (two) times daily.    ? B Complex Vitamins (VITAMIN-B COMPLEX PO) Take 1 tablet by mouth daily.    ? buPROPion (WELLBUTRIN SR) 200 MG 12 hr tablet TAKE 1 TABLET BY MOUTH TWICE A DAY 180 tablet 1  ? Calcium Carbonate-Vit D-Min (CALCIUM 1200 PO) Take 1 capsule by mouth daily.    ? cetirizine (ZYRTEC) 10 MG tablet Take 10 mg by  mouth daily.    ? diltiazem (CARDIZEM) 30 MG tablet Take 30 mg by mouth as needed.     ? docusate sodium (COLACE) 100 MG capsule Take 100 mg by mouth 2 (two) times daily.    ? HOMEOPATHIC PRODUCTS PO Take 1 tablet by mouth daily. NAD Supplement    ? KLOR-CON M20 20 MEQ tablet TAKE 1 TABLET BY MOUTH EVERY DAY 90 tablet 1  ? Melatonin 10 MG CAPS Take 1 capsule by mouth daily.    ? metFORMIN (GLUCOPHAGE-XR) 500 MG 24 hr tablet TAKE 1 TABLET BY MOUTH EVERY DAY WITH BREAKFAST 90 tablet 1  ? metoprolol succinate (TOPROL-XL) 25 MG 24 hr tablet Take 25 mg by mouth daily.    ? Multiple Vitamin (MULTIVITAMIN) tablet Take 1 tablet by mouth daily.    ? Multiple Vitamins-Minerals (HAIR SKIN AND NAILS FORMULA PO) Take 3 tablets by mouth daily.    ? Probiotic Product (TRUNATURE DIGESTIVE PROBIOTIC) CAPS Take by mouth.    ? rosuvastatin (CRESTOR) 10 MG tablet Take 1 tablet (10 mg total) by mouth daily. 90 tablet 1  ? Semaglutide, 2 MG/DOSE, 8 MG/3ML SOPN Inject 2 mg as directed once a week. 9 mL 1  ? Azelaic Acid 15 % gel Apply topically.    ? spironolactone (ALDACTONE) 25 MG tablet Take 1 tablet by mouth daily.    ? ?No facility-administered medications prior to visit.  ? ? ?Review of  Systems; ? ?Patient denies headache, fevers, malaise, unintentional weight loss, skin rash, eye pain, sinus congestion and sinus pain, sore throat, dysphagia,  hemoptysis , cough, dyspnea, wheezing, chest pain, palpitations, orthopnea, edema, abdominal pain, nausea, melena, diarrhea, constipation, flank pain, dysuria, hematuria, urinary  Frequency, nocturia, numbness, tingling, seizures,  Focal weakness, Loss of consciousness,  Tremor, insomnia, depression, anxiety, and suicidal ideation.   ? ? ? ?Objective:  ?BP 114/80 (BP Location: Right Arm, Patient Position: Sitting, Cuff Size: Large)   Pulse 91   Temp 98.2 ?F (36.8 ?C) (Oral)   Ht _0  (1.702 m)   Wt 198 lb 6.4 oz (90 kg)   SpO2 99%   BMI 31.07 kg/m?  ? ?BP Readings from Last 3  Encounters:  ?07/06/21 114/80  ?11/17/20 122/66  ?08/11/20 122/76  ? ? ?Wt Readings from Last 3 Encounters:  ?07/06/21 198 lb 6.4 oz (90 kg)  ?03/24/21 203 lb (92.1 kg)  ?03/18/21 206 lb (93.4 kg)  ? ? ?General appearance: alert, cooperative and appears stated age ?Ears: normal TM's and external ear canals both ears ?Throat: lips, mucosa, and tongue normal; teeth and gums normal ?Neck: no adenopathy, no carotid bruit, supple, symmetrical, trachea midline and thyroid not enlarged, symmetric, no tenderness/mass/nodules ?Back: symmetric, no curvature. ROM normal. No CVA tenderness. ?Lungs: clear to auscultation bilaterally ?Heart: regular rate and rhythm, S1, S2 normal, no murmur, click, rub or gallop ?Abdomen: soft, non-tender; bowel sounds normal; no masses,  no organomegaly ?Pulses: 2+ and symmetric ?Skin: Skin color, texture, turgor normal. No rashes or lesions ?Lymph nodes: Cervical, supraclavicular, and axillary nodes normal. ? ?Lab Results  ?Component Value Date  ? HGBA1C 5.2 07/06/2021  ? HGBA1C 5.8 02/23/2021  ? HGBA1C 5.8 (H) 01/11/2020  ? ? ?Lab Results  ?Component Value Date  ? CREATININE 0.69 06/11/2021  ? CREATININE 0.78 02/23/2021  ? CREATININE 0.75 11/17/2020  ? ? ?Lab Results  ?Component Value Date  ? WBC 4.6 06/11/2021  ? HGB 13.4 06/11/2021  ? HCT 39.4 06/11/2021  ? PLT 217.0 06/11/2021  ? GLUCOSE 96 06/11/2021  ? CHOL 109 06/11/2021  ? TRIG 60.0 06/11/2021  ? HDL 57.90 06/11/2021  ? LDLDIRECT 146.4 01/11/2013  ? LDLCALC 39 06/11/2021  ? ALT 17 06/11/2021  ? AST 20 06/11/2021  ? NA 135 06/11/2021  ? K 4.4 06/11/2021  ? CL 103 06/11/2021  ? CREATININE 0.69 06/11/2021  ? BUN 17 06/11/2021  ? CO2 25 06/11/2021  ? TSH 1.38 01/16/2019  ? INR 1.0 11/24/2017  ? HGBA1C 5.2 07/06/2021  ? ? ?US ABDOMEN LIMITED RUQ (LIVER/GB) ? ?Result Date: 03/17/2020 ?CLINICAL DATA:  Fatty liver. EXAM: ULTRASOUND ABDOMEN LIMITED RIGHT UPPER QUADRANT COMPARISON:  By ultrasound on 02/22/2013 and MRI of the abdomen on 03/05/2013  FINDINGS: Gallbladder: No gallstones or wall thickening visualized. No sonographic Murphy sign noted by sonographer. Common bile duct: Diameter: 6 mm Liver: The liver demonstrates coarse echotexture and increased echogenicity, likely reflecting diffuse steatosis. No overt cirrhotic contour abnormalities or focal lesions are identified. There is no evidence of intrahepatic biliary ductal dilatation. Portal vein is patent on color Doppler imaging with normal direction of blood flow towards the liver. Other: No ascites identified. IMPRESSION: Evidence of hepatic steatosis by ultrasound without overt cirrhosis. No focal hepatic lesions identified. Electronically Signed   By: Aletta Edouard M.D.   On: 03/17/2020 08:46  ? ? ?Assessment & Plan:  ? ?Problem List Items Addressed This Visit   ? ? Atrial fibrillation ([place])  ?  S/p ablation in 2014, with no recent episodes in over 6 months .  Continue metoprolol and prn cardizem. Saw Dr Ubaldo Glassing in Jan 2023:  "She remains fairly active without exertional symptoms. There is no exertional component to the symptoms. She underwent a functional study showing borderline inferior ischemia. CT FFR done in the recent past showed no significant disease." ? ?  ?  ? Relevant Medications  ? spironolactone (ALDACTONE) 25 MG tablet  ? Bronchiectasis ([place])  ?  Using spirometry with improved symptoms. 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  Has had 2nd opinion from [facility] pulmonology ,  No indication for antibiotics. At this time.  She has not had a productive cough in over 6 months  ? ?  ?  ? Current mild episode of major depressive disorder without prior episode ([place])  ?  Managed with wellbutrin .  Asymptomatic prefers to remain on medication  ? ?  ?  ? Morbid obesity ([place])  ?  She has lost 30 lbs since May 2022 with lifestyle changes and addition of  Ozempic  To metformin  for appetite suppression.  limiting carbs . First  goal met (<200).  Exercising regularly . Weight plateaued but has lost 6 lbs by changing routine.  Has  been at same weight now for the plast 10 days  ? ?  ?  ? Pain and swelling of toe, left  ?  Suspect MT fracture,  Pl

## 2021-07-06 NOTE — Assessment & Plan Note (Signed)
Using spirometry with improved symptoms. 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  Has had 2nd opinion from [facility] pulmonology ,  No indication for antibiotics. At this time.  She has not had a productive cough in over 6 months  ?

## 2021-07-06 NOTE — Assessment & Plan Note (Signed)
Suspect MT fracture,  Plain films ordered  ?

## 2021-07-06 NOTE — Assessment & Plan Note (Addendum)
S/p ablation in 2014, with no recent episodes in over 6 months .  Continue metoprolol and prn cardizem. Saw Dr Ubaldo Glassing in Jan 2023:  "She remains fairly active without exertional symptoms. There is no exertional component to the symptoms. She underwent a functional study showing borderline inferior ischemia. CT FFR done in the recent past showed no significant disease." ? ?

## 2021-07-17 ENCOUNTER — Other Ambulatory Visit: Payer: Self-pay | Admitting: Internal Medicine

## 2021-07-17 DIAGNOSIS — R7301 Impaired fasting glucose: Secondary | ICD-10-CM

## 2021-08-07 ENCOUNTER — Telehealth: Payer: Medicare PPO

## 2021-08-15 DIAGNOSIS — S92355A Nondisplaced fracture of fifth metatarsal bone, left foot, initial encounter for closed fracture: Secondary | ICD-10-CM | POA: Diagnosis not present

## 2021-08-20 ENCOUNTER — Other Ambulatory Visit: Payer: Self-pay | Admitting: Internal Medicine

## 2021-08-20 ENCOUNTER — Telehealth: Payer: Self-pay

## 2021-08-20 DIAGNOSIS — E876 Hypokalemia: Secondary | ICD-10-CM

## 2021-08-20 NOTE — Telephone Encounter (Signed)
I don't see that pt is due for labs. She just had labs done in 05/2021 and they were normal.

## 2021-08-20 NOTE — Telephone Encounter (Signed)
Patient states she would like to schedule an appointment for labs.  I let her know that I do not see an order for labs in the system.  I asked if she knows what type of labs we would be ordering.  Patient states it may be to check her potassium level because of her blood pressure medicine.

## 2021-08-21 NOTE — Telephone Encounter (Signed)
Spoke with pt and scheduled her lab appt.

## 2021-08-26 ENCOUNTER — Other Ambulatory Visit (INDEPENDENT_AMBULATORY_CARE_PROVIDER_SITE_OTHER): Payer: Medicare PPO

## 2021-08-26 DIAGNOSIS — E876 Hypokalemia: Secondary | ICD-10-CM

## 2021-08-27 LAB — BASIC METABOLIC PANEL
BUN: 12 mg/dL (ref 6–23)
CO2: 24 mEq/L (ref 19–32)
Calcium: 9.8 mg/dL (ref 8.4–10.5)
Chloride: 103 mEq/L (ref 96–112)
Creatinine, Ser: 0.72 mg/dL (ref 0.40–1.20)
GFR: 82.68 mL/min (ref >60.00)
Glucose, Bld: 76 mg/dL (ref 70–99)
Potassium: 4.2 mEq/L (ref 3.5–5.1)
Sodium: 138 mEq/L (ref 135–145)

## 2021-08-28 DIAGNOSIS — S92355A Nondisplaced fracture of fifth metatarsal bone, left foot, initial encounter for closed fracture: Secondary | ICD-10-CM | POA: Diagnosis not present

## 2021-09-07 DIAGNOSIS — R262 Difficulty in walking, not elsewhere classified: Secondary | ICD-10-CM | POA: Diagnosis not present

## 2021-09-07 DIAGNOSIS — M76822 Posterior tibial tendinitis, left leg: Secondary | ICD-10-CM | POA: Diagnosis not present

## 2021-09-07 DIAGNOSIS — S92355A Nondisplaced fracture of fifth metatarsal bone, left foot, initial encounter for closed fracture: Secondary | ICD-10-CM | POA: Diagnosis not present

## 2021-09-07 DIAGNOSIS — G5752 Tarsal tunnel syndrome, left lower limb: Secondary | ICD-10-CM | POA: Diagnosis not present

## 2021-09-07 DIAGNOSIS — M79672 Pain in left foot: Secondary | ICD-10-CM | POA: Diagnosis not present

## 2021-09-12 ENCOUNTER — Other Ambulatory Visit: Payer: Self-pay | Admitting: Internal Medicine

## 2021-09-12 DIAGNOSIS — K760 Fatty (change of) liver, not elsewhere classified: Secondary | ICD-10-CM

## 2021-09-28 DIAGNOSIS — M76822 Posterior tibial tendinitis, left leg: Secondary | ICD-10-CM | POA: Diagnosis not present

## 2021-09-28 DIAGNOSIS — S92355D Nondisplaced fracture of fifth metatarsal bone, left foot, subsequent encounter for fracture with routine healing: Secondary | ICD-10-CM | POA: Diagnosis not present

## 2021-09-28 DIAGNOSIS — M79672 Pain in left foot: Secondary | ICD-10-CM | POA: Diagnosis not present

## 2021-09-28 DIAGNOSIS — R262 Difficulty in walking, not elsewhere classified: Secondary | ICD-10-CM | POA: Diagnosis not present

## 2021-09-28 DIAGNOSIS — G5752 Tarsal tunnel syndrome, left lower limb: Secondary | ICD-10-CM | POA: Diagnosis not present

## 2021-11-09 DIAGNOSIS — Z96641 Presence of right artificial hip joint: Secondary | ICD-10-CM | POA: Diagnosis not present

## 2021-11-09 DIAGNOSIS — M25351 Other instability, right hip: Secondary | ICD-10-CM | POA: Diagnosis not present

## 2021-11-11 DIAGNOSIS — L570 Actinic keratosis: Secondary | ICD-10-CM | POA: Diagnosis not present

## 2021-11-11 DIAGNOSIS — Z859 Personal history of malignant neoplasm, unspecified: Secondary | ICD-10-CM | POA: Diagnosis not present

## 2021-11-11 DIAGNOSIS — Z85828 Personal history of other malignant neoplasm of skin: Secondary | ICD-10-CM | POA: Diagnosis not present

## 2021-11-11 DIAGNOSIS — L821 Other seborrheic keratosis: Secondary | ICD-10-CM | POA: Diagnosis not present

## 2021-11-11 DIAGNOSIS — L578 Other skin changes due to chronic exposure to nonionizing radiation: Secondary | ICD-10-CM | POA: Diagnosis not present

## 2021-11-11 DIAGNOSIS — Z86018 Personal history of other benign neoplasm: Secondary | ICD-10-CM | POA: Diagnosis not present

## 2021-11-11 DIAGNOSIS — Z85820 Personal history of malignant melanoma of skin: Secondary | ICD-10-CM | POA: Diagnosis not present

## 2021-11-11 DIAGNOSIS — L718 Other rosacea: Secondary | ICD-10-CM | POA: Diagnosis not present

## 2021-11-11 DIAGNOSIS — Z872 Personal history of diseases of the skin and subcutaneous tissue: Secondary | ICD-10-CM | POA: Diagnosis not present

## 2021-11-13 DIAGNOSIS — M7061 Trochanteric bursitis, right hip: Secondary | ICD-10-CM | POA: Diagnosis not present

## 2021-11-28 DIAGNOSIS — M7601 Gluteal tendinitis, right hip: Secondary | ICD-10-CM | POA: Diagnosis not present

## 2021-12-02 DIAGNOSIS — M7601 Gluteal tendinitis, right hip: Secondary | ICD-10-CM | POA: Diagnosis not present

## 2021-12-02 DIAGNOSIS — S76311A Strain of muscle, fascia and tendon of the posterior muscle group at thigh level, right thigh, initial encounter: Secondary | ICD-10-CM | POA: Diagnosis not present

## 2021-12-03 ENCOUNTER — Other Ambulatory Visit: Payer: Self-pay | Admitting: Internal Medicine

## 2021-12-03 DIAGNOSIS — R7301 Impaired fasting glucose: Secondary | ICD-10-CM

## 2021-12-03 NOTE — Telephone Encounter (Signed)
RX SENT PT NOTIFIED 

## 2021-12-10 ENCOUNTER — Encounter: Payer: Self-pay | Admitting: Internal Medicine

## 2021-12-10 DIAGNOSIS — Z780 Asymptomatic menopausal state: Secondary | ICD-10-CM

## 2021-12-10 DIAGNOSIS — M25551 Pain in right hip: Secondary | ICD-10-CM | POA: Diagnosis not present

## 2021-12-10 NOTE — Telephone Encounter (Signed)
Last bone density scan was in 2019. I have pended the order for approval.

## 2021-12-23 DIAGNOSIS — M25551 Pain in right hip: Secondary | ICD-10-CM | POA: Diagnosis not present

## 2021-12-24 NOTE — Telephone Encounter (Signed)
Patient called and stated that [facility] has nor received the referral. Please fax to [phone].

## 2021-12-24 NOTE — Telephone Encounter (Signed)
Order printed and placed in quick sign folder.

## 2022-01-04 DIAGNOSIS — M25551 Pain in right hip: Secondary | ICD-10-CM | POA: Diagnosis not present

## 2022-01-05 ENCOUNTER — Encounter: Payer: Self-pay | Admitting: Internal Medicine

## 2022-01-05 ENCOUNTER — Other Ambulatory Visit: Payer: Self-pay | Admitting: Internal Medicine

## 2022-01-06 ENCOUNTER — Ambulatory Visit: Payer: Medicare PPO | Admitting: Internal Medicine

## 2022-01-06 ENCOUNTER — Encounter: Payer: Self-pay | Admitting: Internal Medicine

## 2022-01-06 VITALS — BP 118/78 | HR 95 | Temp 97.9°F | Ht 67.0 in | Wt 194.4 lb

## 2022-01-06 DIAGNOSIS — M8580 Other specified disorders of bone density and structure, unspecified site: Secondary | ICD-10-CM

## 2022-01-06 DIAGNOSIS — I10 Essential (primary) hypertension: Secondary | ICD-10-CM

## 2022-01-06 DIAGNOSIS — E559 Vitamin D deficiency, unspecified: Secondary | ICD-10-CM | POA: Diagnosis not present

## 2022-01-06 DIAGNOSIS — R7303 Prediabetes: Secondary | ICD-10-CM | POA: Diagnosis not present

## 2022-01-06 DIAGNOSIS — R5383 Other fatigue: Secondary | ICD-10-CM

## 2022-01-06 DIAGNOSIS — E785 Hyperlipidemia, unspecified: Secondary | ICD-10-CM

## 2022-01-06 LAB — COMPREHENSIVE METABOLIC PANEL
ALT: 17 U/L (ref 0–35)
AST: 17 U/L (ref 0–37)
Albumin: 4.3 g/dL (ref 3.5–5.2)
Alkaline Phosphatase: 80 U/L (ref 39–117)
BUN: 15 mg/dL (ref 6–23)
CO2: 26 mEq/L (ref 19–32)
Calcium: 9.3 mg/dL (ref 8.4–10.5)
Chloride: 103 mEq/L (ref 96–112)
Creatinine, Ser: 0.62 mg/dL (ref 0.40–1.20)
GFR: 87.83 mL/min (ref >60.00)
Glucose, Bld: 72 mg/dL (ref 70–99)
Potassium: 4.2 mEq/L (ref 3.5–5.1)
Sodium: 136 mEq/L (ref 135–145)
Total Bilirubin: 0.5 mg/dL (ref 0.2–1.2)
Total Protein: 7 g/dL (ref 6.0–8.3)

## 2022-01-06 LAB — TSH: TSH: 1.8 u[IU]/mL (ref 0.35–5.50)

## 2022-01-06 LAB — LIPID PANEL
Cholesterol: 123 mg/dL (ref 0–200)
HDL: 70.4 mg/dL (ref >39.00)
LDL Cholesterol: 41 mg/dL (ref 0–99)
NonHDL: 52.61
Total CHOL/HDL Ratio: 2
Triglycerides: 56 mg/dL (ref 0.0–149.0)
VLDL: 11.2 mg/dL (ref 0.0–40.0)

## 2022-01-06 LAB — MICROALBUMIN / CREATININE URINE RATIO
Creatinine,U: 55.7 mg/dL
Microalb Creat Ratio: 1.3 mg/g (ref 0.0–30.0)
Microalb, Ur: <0.7 mg/dL (ref 0.0–1.9)

## 2022-01-06 LAB — LDL CHOLESTEROL, DIRECT: Direct LDL: 44 mg/dL

## 2022-01-06 LAB — VITAMIN D 25 HYDROXY (VIT D DEFICIENCY, FRACTURES): VITD: 33.94 ng/mL (ref 30.00–100.00)

## 2022-01-06 LAB — HEMOGLOBIN A1C: Hgb A1c MFr Bld: 6.1 % (ref 4.6–6.5)

## 2022-01-06 NOTE — Patient Instructions (Addendum)
  Your  DEXA scan  been ordered and received by [facility]!  If  you have trouble getting through ,  the number we use is is [phone]  ask for Scheduling

## 2022-01-06 NOTE — Assessment & Plan Note (Signed)
Repeating DEXA given recent non traumatic sacral ala fracture .  Risk factors for rapid progression include recurrent steroids during workup of bronchiectasis.

## 2022-01-06 NOTE — Assessment & Plan Note (Signed)
I have congratulated her in reduction of   BMI and encouraged  Continued weight loss with goal of 10% of body weigh over the next 6 months using a low glycemic index diet and regular exercise a minimum of 5 days per week.    

## 2022-01-06 NOTE — Progress Notes (Signed)
Subjective:  Patient ID: Heidi Walsh, female    DOB: 04-08-1945  Age: 76 y.o. MRN: [id number]  CC: The primary encounter diagnosis was Essential hypertension. Diagnoses of Prediabetes, Fatigue, unspecified type, Hyperlipidemia, unspecified hyperlipidemia type, Vitamin D deficiency, Osteopenia, unspecified location, and Morbid obesity ([place]) were also pertinent to this visit.   HPI Heidi Walsh presents for  Chief Complaint  Patient presents with   Follow-up    6 month follow up    Obesity:  she has been intentionally losing weight with appetite suppressant pharmacotherapy.  Using ozempic at 2 mg dose. In spite of obstacles to exercise created by several fractures:    5th MT fracture in mid June.   had to wear a boot,  slow to heal took 8 weeks,  followed by a pelvic fracture that occurred non traumatically in August   H/o right arthroplasty,  done in 2019 .  On August 11 developed excruciating  right  hip  pain during straightening of lounge chairs ( no fall).  Went to [facility]  saw Dr Faylene Kurtz ,  x ray normal,  ?  Partial dislocation ?  Given steroid injection and pred taper without improvement . Had an MRI  of hip which found a nondisplaced sacral ala fracture with a gluteal tear and a fluid collection  2.6 x 2 cm  in the lesser sciatic foramen . Requested referral for DEXA at [facility] in [place] , but scheduling of DEXA has been delayed by a lack of time stamp on signature ,, followed by nonfunctioning DEXA . She has had mutiple rounds of prednisone over the last several years.  Given celebrex and a short supply of hydrocodone which has not helped with the hip pain. P, the pain is improving but not gone. Worried about reinjuring it , disolicating it,  falling.  Participating in PT and walking but no yoga.   Taking 1200 mg calcium with D daily   Worried about the fluid collection being related to her previous bladder CA and breast CA.  Waiting for [facility] vascular surgery appt to  aspirate the fluid collection . Feels like she is sitting on a rock . Taking 2 81 mg aspirin daily.    Declines shingrix .     Outpatient Medications Prior to Visit  Medication Sig Dispense Refill   ALPRAZolam (XANAX) 0.25 MG tablet TAKE 1 TABLET BY MOUTH TWICE A DAY AS NEEDED FOR ANXIETY 60 tablet 1   aspirin 81 MG tablet Take 81 mg by mouth 2 (two) times daily.     Azelaic Acid 15 % gel Apply topically.     B Complex Vitamins (VITAMIN-B COMPLEX PO) Take 1 tablet by mouth daily.     buPROPion (WELLBUTRIN SR) 200 MG 12 hr tablet TAKE 1 TABLET BY MOUTH TWICE A DAY 180 tablet 1   Calcium Carbonate-Vit D-Min (CALCIUM 1200 PO) Take 1 capsule by mouth daily.     cetirizine (ZYRTEC) 10 MG tablet Take 10 mg by mouth daily.     diltiazem (CARDIZEM) 30 MG tablet Take 30 mg by mouth as needed.      docusate sodium (COLACE) 100 MG capsule Take 100 mg by mouth 2 (two) times daily.     HOMEOPATHIC PRODUCTS PO Take 1 tablet by mouth daily. NAD Supplement     KLOR-CON M20 20 MEQ tablet TAKE 1 TABLET BY MOUTH EVERY DAY 90 tablet 1   Melatonin 10 MG CAPS Take 1 capsule by mouth daily.  metFORMIN (GLUCOPHAGE-XR) 500 MG 24 hr tablet TAKE 1 TABLET BY MOUTH EVERY DAY WITH BREAKFAST 90 tablet 1   metoprolol succinate (TOPROL-XL) 25 MG 24 hr tablet Take 25 mg by mouth daily.     Multiple Vitamin (MULTIVITAMIN) tablet Take 1 tablet by mouth daily.     Multiple Vitamins-Minerals (HAIR SKIN AND NAILS FORMULA PO) Take 3 tablets by mouth daily.     Probiotic Product (TRUNATURE DIGESTIVE PROBIOTIC) CAPS Take by mouth.     rosuvastatin (CRESTOR) 10 MG tablet TAKE 1 TABLET BY MOUTH EVERY DAY 90 tablet 1   Semaglutide, 2 MG/DOSE, (OZEMPIC, 2 MG/DOSE,) 8 MG/3ML SOPN INJECT 2 MG AS DIRECTED ONCE A WEEK. 28 mL 2   spironolactone (ALDACTONE) 25 MG tablet Take 1 tablet (25 mg total) by mouth daily. 90 tablet 3   No facility-administered medications prior to visit.    Review of Systems;  Patient denies headache,  fevers, malaise, unintentional weight loss, skin rash, eye pain, sinus congestion and sinus pain, sore throat, dysphagia,  hemoptysis , cough, dyspnea, wheezing, chest pain, palpitations, orthopnea, edema, abdominal pain, nausea, melena, diarrhea, constipation, flank pain, dysuria, hematuria, urinary  Frequency, nocturia, numbness, tingling, seizures,  Focal weakness, Loss of consciousness,  Tremor, insomnia, depression, anxiety, and suicidal ideation.      Objective:  BP 118/78 (BP Location: Right Arm, Patient Position: Sitting, Cuff Size: Normal)   Pulse 95   Temp 97.9 F (36.6 C) (Oral)   Ht 5\' 7"  (1.702 m)   Wt 194 lb 6.4 oz (88.2 kg)   SpO2 99%   BMI 30.45 kg/m   BP Readings from Last 3 Encounters:  01/06/22 118/78  07/06/21 114/80  11/17/20 122/66    Wt Readings from Last 3 Encounters:  01/06/22 194 lb 6.4 oz (88.2 kg)  07/06/21 198 lb 6.4 oz (90 kg)  03/24/21 203 lb (92.1 kg)    General appearance: alert, cooperative and appears stated age Ears: normal TM's and external ear canals both ears Throat: lips, mucosa, and tongue normal; teeth and gums normal Neck: no adenopathy, no carotid bruit, supple, symmetrical, trachea midline and thyroid not enlarged, symmetric, no tenderness/mass/nodules Back: symmetric, no curvature. ROM normal. No CVA tenderness. Lungs: clear to auscultation bilaterally Heart: regular rate and rhythm, S1, S2 normal, no murmur, click, rub or gallop Abdomen: soft, non-tender; bowel sounds normal; no masses,  no organomegaly Pulses: 2+ and symmetric Skin: Skin color, texture, turgor normal. No rashes or lesions Lymph nodes: Cervical, supraclavicular, and axillary nodes normal. Neuro:  awake and interactive with normal mood and affect. Higher cortical functions are normal. Speech is clear without word-finding difficulty or dysarthria. Extraocular movements are intact. Visual fields of both eyes are grossly intact. Sensation to light touch is grossly  intact bilaterally of upper and lower extremities. Motor examination shows 4+/5 symmetric hand grip and upper extremity and 5/5 lower extremity strength. There is no pronation or drift. Gait is non-ataxic   Lab Results  Component Value Date   HGBA1C 6.1 01/06/2022   HGBA1C 5.2 07/06/2021   HGBA1C 5.8 02/23/2021    Lab Results  Component Value Date   CREATININE 0.62 01/06/2022   CREATININE 0.72 08/26/2021   CREATININE 0.69 06/11/2021    Lab Results  Component Value Date   WBC 4.6 06/11/2021   HGB 13.4 06/11/2021   HCT 39.4 06/11/2021   PLT 217.0 06/11/2021   GLUCOSE 72 01/06/2022   CHOL 123 01/06/2022   TRIG 56.0 01/06/2022   HDL 70.40 01/06/2022  LDLDIRECT 44.0 01/06/2022   LDLCALC 41 01/06/2022   ALT 17 01/06/2022   AST 17 01/06/2022   NA 136 01/06/2022   K 4.2 01/06/2022   CL 103 01/06/2022   CREATININE 0.62 01/06/2022   BUN 15 01/06/2022   CO2 26 01/06/2022   TSH 1.80 01/06/2022   INR 1.0 11/24/2017   HGBA1C 6.1 01/06/2022   MICROALBUR <0.7 01/06/2022    US ABDOMEN LIMITED RUQ (LIVER/GB)  Result Date: 03/17/2020 CLINICAL DATA:  Fatty liver. EXAM: ULTRASOUND ABDOMEN LIMITED RIGHT UPPER QUADRANT COMPARISON:  By ultrasound on 02/22/2013 and MRI of the abdomen on 03/05/2013 FINDINGS: Gallbladder: No gallstones or wall thickening visualized. No sonographic Murphy sign noted by sonographer. Common bile duct: Diameter: 6 mm Liver: The liver demonstrates coarse echotexture and increased echogenicity, likely reflecting diffuse steatosis. No overt cirrhotic contour abnormalities or focal lesions are identified. There is no evidence of intrahepatic biliary ductal dilatation. Portal vein is patent on color Doppler imaging with normal direction of blood flow towards the liver. Other: No ascites identified. IMPRESSION: Evidence of hepatic steatosis by ultrasound without overt cirrhosis. No focal hepatic lesions identified. Electronically Signed   By: Aletta Edouard M.D.   On:  03/17/2020 08:46    Assessment & Plan:   Problem List Items Addressed This Visit     Essential hypertension - Primary   Relevant Orders   Comp Met (CMET) (Completed)   Urine Microalbumin w/creat. ratio (Completed)   Morbid obesity ([place])    I have congratulated her in reduction of   BMI and encouraged  Continued weight loss with goal of 10% of body weigh over the next 6 months using a low glycemic index diet and regular exercise a minimum of 5 days per week.        Osteopenia    Repeating DEXA given recent non traumatic sacral ala fracture .  Risk factors for rapid progression include recurrent steroids during workup of bronchiectasis.       Other Visit Diagnoses     Prediabetes       Relevant Orders   HgB A1c (Completed)   Fatigue, unspecified type       Relevant Orders   TSH (Completed)   Hyperlipidemia, unspecified hyperlipidemia type       Relevant Orders   Lipid Profile (Completed)   Direct LDL (Completed)   Vitamin D deficiency       Relevant Orders   VITAMIN D 25 Hydroxy (Vit-D Deficiency, Fractures) (Completed)       I spent a total of  30  minutes with this patient in a face to face visit on the date of this encounter reviewing the last office visit with me , most recent visit with orthopedics ,  patient's diet and exercise habits, last DEXA scan,  and post visit ordering of testing and therapeutics.    Follow-up: Return in about 6 months (around 07/08/2022).   Crecencio Mc, MD

## 2022-01-10 DIAGNOSIS — R109 Unspecified abdominal pain: Secondary | ICD-10-CM | POA: Diagnosis not present

## 2022-01-10 DIAGNOSIS — R300 Dysuria: Secondary | ICD-10-CM | POA: Diagnosis not present

## 2022-01-11 DIAGNOSIS — M25551 Pain in right hip: Secondary | ICD-10-CM | POA: Diagnosis not present

## 2022-01-12 DIAGNOSIS — Z780 Asymptomatic menopausal state: Secondary | ICD-10-CM | POA: Diagnosis not present

## 2022-01-13 ENCOUNTER — Other Ambulatory Visit: Payer: Self-pay | Admitting: Internal Medicine

## 2022-01-13 ENCOUNTER — Encounter: Payer: Self-pay | Admitting: Internal Medicine

## 2022-01-13 DIAGNOSIS — H25813 Combined forms of age-related cataract, bilateral: Secondary | ICD-10-CM | POA: Diagnosis not present

## 2022-01-13 DIAGNOSIS — N3000 Acute cystitis without hematuria: Secondary | ICD-10-CM

## 2022-01-13 DIAGNOSIS — H04123 Dry eye syndrome of bilateral lacrimal glands: Secondary | ICD-10-CM | POA: Diagnosis not present

## 2022-01-13 MED ORDER — SEMAGLUTIDE-WEIGHT MANAGEMENT 2.4 MG/0.75ML ~~LOC~~ SOAJ: 2.4000 mg | SUBCUTANEOUS | 2 refills | Status: DC

## 2022-01-13 NOTE — Telephone Encounter (Signed)
Ozempic is on back order and pt would like to know if [facility] could be sent in instead. Also does pt need to have a repeat UA since she has completed the 5 days of Cipro for a UTI she was recently treated for.

## 2022-01-14 DIAGNOSIS — I480 Paroxysmal atrial fibrillation: Secondary | ICD-10-CM | POA: Diagnosis not present

## 2022-01-14 DIAGNOSIS — B9620 Unspecified Escherichia coli [E. coli] as the cause of diseases classified elsewhere: Secondary | ICD-10-CM | POA: Diagnosis not present

## 2022-01-14 DIAGNOSIS — I10 Essential (primary) hypertension: Secondary | ICD-10-CM | POA: Diagnosis not present

## 2022-01-14 DIAGNOSIS — R0789 Other chest pain: Secondary | ICD-10-CM | POA: Diagnosis not present

## 2022-01-14 DIAGNOSIS — N390 Urinary tract infection, site not specified: Secondary | ICD-10-CM | POA: Diagnosis not present

## 2022-01-14 DIAGNOSIS — Z20822 Contact with and (suspected) exposure to covid-19: Secondary | ICD-10-CM | POA: Diagnosis not present

## 2022-01-15 DIAGNOSIS — N390 Urinary tract infection, site not specified: Secondary | ICD-10-CM | POA: Diagnosis not present

## 2022-01-15 DIAGNOSIS — N3000 Acute cystitis without hematuria: Secondary | ICD-10-CM | POA: Diagnosis not present

## 2022-01-16 DIAGNOSIS — N3000 Acute cystitis without hematuria: Secondary | ICD-10-CM | POA: Diagnosis not present

## 2022-01-18 ENCOUNTER — Telehealth: Payer: Self-pay

## 2022-01-18 ENCOUNTER — Encounter: Payer: Self-pay | Admitting: Internal Medicine

## 2022-01-18 DIAGNOSIS — B952 Enterococcus as the cause of diseases classified elsewhere: Secondary | ICD-10-CM

## 2022-01-18 NOTE — Telephone Encounter (Addendum)
Transition Care Management Unsuccessful Follow-up Telephone Call  Date of discharge and from where:  TCM DC [facility] 01-16-22 Dx: UTI  Attempts:  1st Attempt  Reason for unsuccessful TCM follow-up call:  Left voice message   Woodfin Ganja LPN [facility] Nurse Health Advisor Direct Dial [phone]   Transition Care Management Unsuccessful Follow-up Telephone Call  Date of discharge and from where:  TCM DC [facility] 01-16-22 Dx: UTI  Attempts:  2nd Attempt  Reason for unsuccessful TCM follow-up call:  Left voice message   Woodfin Ganja LPN [facility] Nurse Health Advisor Direct Dial [phone]    Transition Care Management Unsuccessful Follow-up Telephone Call  Date of discharge and from where:  TCM DC [facility] 01-16-22 Dx: UTI  Attempts:  3rd Attempt  Reason for unsuccessful TCM follow-up call:  Left voice message   Woodfin Ganja LPN [facility] Nurse Health Advisor Direct Dial [phone]

## 2022-01-19 DIAGNOSIS — B952 Enterococcus as the cause of diseases classified elsewhere: Secondary | ICD-10-CM

## 2022-01-19 DIAGNOSIS — K5730 Diverticulosis of large intestine without perforation or abscess without bleeding: Secondary | ICD-10-CM | POA: Diagnosis not present

## 2022-01-19 HISTORY — DX: Enterococcus as the cause of diseases classified elsewhere: B95.2

## 2022-01-19 MED ORDER — FOSFOMYCIN TROMETHAMINE 3 G PO PACK: 3.0000 g | PACK | Freq: Once | ORAL | 0 refills | Status: AC

## 2022-01-20 ENCOUNTER — Ambulatory Visit: Payer: Medicare PPO | Admitting: Internal Medicine

## 2022-01-20 ENCOUNTER — Encounter: Payer: Self-pay | Admitting: Internal Medicine

## 2022-01-20 VITALS — BP 126/78 | HR 88 | Temp 97.9°F | Ht 67.0 in | Wt 193.6 lb

## 2022-01-20 DIAGNOSIS — N390 Urinary tract infection, site not specified: Secondary | ICD-10-CM | POA: Diagnosis not present

## 2022-01-20 DIAGNOSIS — Z8551 Personal history of malignant neoplasm of bladder: Secondary | ICD-10-CM

## 2022-01-20 DIAGNOSIS — Z853 Personal history of malignant neoplasm of breast: Secondary | ICD-10-CM | POA: Diagnosis not present

## 2022-01-20 DIAGNOSIS — B952 Enterococcus as the cause of diseases classified elsewhere: Secondary | ICD-10-CM | POA: Diagnosis not present

## 2022-01-20 DIAGNOSIS — M8580 Other specified disorders of bone density and structure, unspecified site: Secondary | ICD-10-CM

## 2022-01-20 DIAGNOSIS — I480 Paroxysmal atrial fibrillation: Secondary | ICD-10-CM | POA: Diagnosis not present

## 2022-01-20 LAB — URINALYSIS, ROUTINE W REFLEX MICROSCOPIC
Bilirubin Urine: NEGATIVE
Hgb urine dipstick: NEGATIVE
Ketones, ur: NEGATIVE
Leukocytes,Ua: NEGATIVE
Nitrite: NEGATIVE
Specific Gravity, Urine: 1.015 (ref 1.000–1.030)
Total Protein, Urine: NEGATIVE
Urine Glucose: NEGATIVE
Urobilinogen, UA: 0.2 (ref 0.0–1.0)
pH: 7 (ref 5.0–8.0)

## 2022-01-20 NOTE — Assessment & Plan Note (Signed)
She will be looking for a new cardiologist at [facility] to replace Dr Ubaldo Glassing who is retiring .  Sh eis in sinus rhythm today

## 2022-01-20 NOTE — Progress Notes (Signed)
Subjective:  Patient ID: Heidi Walsh, female    DOB: 02-02-1946  Age: 76 y.o. MRN: [id number]  CC: The primary encounter diagnosis was UTI (urinary tract infection) due to Enterococcus. Diagnoses of Osteopenia, unspecified location, Paroxysmal atrial fibrillation ([place]), History of breast cancer, and Personal history of bladder cancer were also pertinent to this visit.   HPI Heidi Walsh presents for hospital follow up on [facility] admission for MDR E Coli UTI Chief Complaint  Patient presents with   Hospitalization [facility] follow up from a UTI   HPI:  76 yr old female with h/o bilteral BRCA, more recently diagnosed and treated bladder CA,  developed UTI   On Sunday Oct 22  she went to  [facility] Urgent care for dysuria, was treated  empirically with cipro 500 mg  bid; however her symptoms of dysuria, suprapubic bloating,  urinary hesitancy  did not clear up and culture grew MDR E Coli so she was directed to [facility] ER . Was admitted  to [facility] on October 26  for IV antibiotics and was admitted to receive IVgentamycin x 3 days   Felt good initially,  but started feeling poorly so she was kept in house   and had an u/s of kidneys and bladder Which was normal   Received 3  doses of gentamycin (once daily) and discharged  on Oct 28 in improved condition .  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  Did not go into atrial fib.    Lab Results  Component Value Date   HGBA1C 6.1 01/06/2022   Prediabetes and overweight:  has not exercised since April due to persistent right hip pain  and post operative foot pain. . Frustrated   Osteopenia:  recent DEXA reviewed,  no significant change.     Outpatient Medications Prior to Visit  Medication Sig Dispense Refill   ALPRAZolam (XANAX) 0.25 MG tablet TAKE 1 TABLET BY MOUTH TWICE A DAY AS NEEDED FOR ANXIETY 60  tablet 1   aspirin 81 MG tablet Take 81 mg by mouth 2 (two) times daily.     B Complex Vitamins (VITAMIN-B COMPLEX PO) Take 1 tablet by mouth daily.     buPROPion (WELLBUTRIN SR) 200 MG 12 hr tablet TAKE 1 TABLET BY MOUTH TWICE A DAY 180 tablet 1   Calcium Carbonate-Vit D-Min (CALCIUM 1200 PO) Take 1 capsule by mouth daily.     cetirizine (ZYRTEC) 10 MG tablet Take 10 mg by mouth daily.     diltiazem (CARDIZEM) 30 MG tablet Take 30 mg by mouth as needed.      docusate sodium (COLACE) 100 MG capsule Take 100 mg by mouth 2 (two) times daily.     HOMEOPATHIC PRODUCTS PO Take 1 tablet by mouth daily. NAD Supplement     KLOR-CON M20 20 MEQ tablet TAKE 1 TABLET BY MOUTH EVERY DAY 90 tablet 1   Melatonin 10 MG CAPS Take 1 capsule by mouth daily.     metFORMIN (GLUCOPHAGE-XR) 500 MG 24 hr tablet TAKE 1 TABLET BY MOUTH EVERY DAY WITH BREAKFAST 90 tablet 1   metoprolol succinate (TOPROL-XL) 25 MG 24 hr tablet Take 25 mg by mouth daily.     Multiple Vitamin (MULTIVITAMIN) tablet Take 1 tablet by mouth daily.     Multiple Vitamins-Minerals (HAIR SKIN AND NAILS FORMULA PO) Take 3 tablets by mouth  daily.     Probiotic Product (TRUNATURE DIGESTIVE PROBIOTIC) CAPS Take by mouth.     rosuvastatin (CRESTOR) 10 MG tablet TAKE 1 TABLET BY MOUTH EVERY DAY 90 tablet 1   [START ON 05/09/2022] Semaglutide-Weight Management 2.4 MG/0.75ML SOAJ Inject 2.4 mg into the skin once a week for 28 days. 3 mL 2   spironolactone (ALDACTONE) 25 MG tablet Take 1 tablet (25 mg total) by mouth daily. 90 tablet 3   fosfomycin (MONUROL) 3 g PACK Take by mouth. (Patient not taking: Reported on 01/20/2022)     Azelaic Acid 15 % gel Apply topically. (Patient not taking: Reported on 01/20/2022)     No facility-administered medications prior to visit.    Review of Systems;  Patient denies headache, fevers, malaise, unintentional weight loss, skin rash, eye pain, sinus congestion and sinus pain, sore throat, dysphagia,  hemoptysis , cough,  dyspnea, wheezing, chest pain, palpitations, orthopnea, edema, abdominal pain, nausea, melena, diarrhea, constipation, flank pain, , hematuria, urinary  Frequency, nocturia, numbness, tingling, seizures,  Focal weakness, Loss of consciousness,  Tremor, insomnia, depression, anxiety, and suicidal ideation.      Objective:  BP 126/78 (BP Location: Left Arm, Patient Position: Sitting, Cuff Size: Normal)   Pulse 88   Temp 97.9 F (36.6 C) (Oral)   Ht _0  (1.702 m)   Wt 193 lb 9.6 oz (87.8 kg)   SpO2 99%   BMI 30.32 kg/m   BP Readings from Last 3 Encounters:  01/20/22 126/78  01/06/22 118/78  07/06/21 114/80    Wt Readings from Last 3 Encounters:  01/20/22 193 lb 9.6 oz (87.8 kg)  01/06/22 194 lb 6.4 oz (88.2 kg)  07/06/21 198 lb 6.4 oz (90 kg)    General appearance: alert, cooperative and appears stated age Ears: normal TM's and external ear canals both ears Throat: lips, mucosa, and tongue normal; teeth and gums normal Neck: no adenopathy, no carotid bruit, supple, symmetrical, trachea midline and thyroid not enlarged, symmetric, no tenderness/mass/nodules Back: symmetric, no curvature. ROM normal. No CVA tenderness. Lungs: clear to auscultation bilaterally Heart: regular rate and rhythm, S1, S2 normal, no murmur, click, rub or gallop Abdomen: soft, non-tender; bowel sounds normal; no masses,  no organomegaly Pulses: 2+ and symmetric Skin: Skin color, texture, turgor normal. No rashes or lesions Lymph nodes: Cervical, supraclavicular, and axillary nodes normal. Neuro:  awake and interactive with normal mood and affect. Higher cortical functions are normal. Speech is clear without word-finding difficulty or dysarthria. Extraocular movements are intact. Visual fields of both eyes are grossly intact. Sensation to light touch is grossly intact bilaterally of upper and lower extremities. Motor examination shows 4+/5 symmetric hand grip and upper extremity and 5/5 lower extremity  strength. There is no pronation or drift. Gait is non-ataxic   Lab Results  Component Value Date   HGBA1C 6.1 01/06/2022   HGBA1C 5.2 07/06/2021   HGBA1C 5.8 02/23/2021    Lab Results  Component Value Date   CREATININE 0.62 01/06/2022   CREATININE 0.72 08/26/2021   CREATININE 0.69 06/11/2021    Lab Results  Component Value Date   WBC 4.6 06/11/2021   HGB 13.4 06/11/2021   HCT 39.4 06/11/2021   PLT 217.0 06/11/2021   GLUCOSE 72 01/06/2022   CHOL 123 01/06/2022   TRIG 56.0 01/06/2022   HDL 70.40 01/06/2022   LDLDIRECT 44.0 01/06/2022   LDLCALC 41 01/06/2022   ALT 17 01/06/2022   AST 17 01/06/2022   NA 136 01/06/2022  K 4.2 01/06/2022   CL 103 01/06/2022   CREATININE 0.62 01/06/2022   BUN 15 01/06/2022   CO2 26 01/06/2022   TSH 1.80 01/06/2022   INR 1.0 11/24/2017   HGBA1C 6.1 01/06/2022   MICROALBUR <0.7 01/06/2022    US ABDOMEN LIMITED RUQ (LIVER/GB)  Result Date: 03/17/2020 CLINICAL DATA:  Fatty liver. EXAM: ULTRASOUND ABDOMEN LIMITED RIGHT UPPER QUADRANT COMPARISON:  By ultrasound on 02/22/2013 and MRI of the abdomen on 03/05/2013 FINDINGS: Gallbladder: No gallstones or wall thickening visualized. No sonographic Murphy sign noted by sonographer. Common bile duct: Diameter: 6 mm Liver: The liver demonstrates coarse echotexture and increased echogenicity, likely reflecting diffuse steatosis. No overt cirrhotic contour abnormalities or focal lesions are identified. There is no evidence of intrahepatic biliary ductal dilatation. Portal vein is patent on color Doppler imaging with normal direction of blood flow towards the liver. Other: No ascites identified. IMPRESSION: Evidence of hepatic steatosis by ultrasound without overt cirrhosis. No focal hepatic lesions identified. Electronically Signed   By: Aletta Edouard M.D.   On: 03/17/2020 08:46    Assessment & Plan:   Problem List Items Addressed This Visit     Paroxysmal atrial fibrillation [facility])    She will be  looking for a new cardiologist at [facility] to replace Dr Ubaldo Glassing who is retiring .  Sh eis in sinus rhythm today       History of breast cancer    We reviewed her history in light of the hospitalist's recommendation to consider vaginal estrogen therapy.  Discussed the rationale, decided against using estrogen       Osteopenia    No Improvement  In T scores by Oct 2023 DEXA done at [facility]       UTI (urinary tract infection) due to Enterococcus - Primary    Treated with IV gentamycin x 3 days by [facility].  Last UI was Klebsiella sensitivie to cipro in Dec 2022/ . She continues to have mild dysuria only.  Rechecking urine today       Relevant Medications   fosfomycin (MONUROL) 3 g PACK   Other Relevant Orders   Urinalysis, Routine w reflex microscopic   Urine Culture   Personal history of bladder cancer    She has had no recurrence on annual surveillance cystoscopies  Last one June 2020 at [facility] urology  ( by [facility])       I spent a total of  40 minutes with this patient in a face to face visit on the date of this encounter reviewing the last office visit with me in October,  her  most recent visit with cardiologist Dr Ubaldo Glassing,  her recent [facility] Hospitalization including labs and imaging studies , counselling on the risks and benefits of estrogen therapy , and post visit ordering of testing and therapeutics.    Follow-up: Return in about 3 months (around 04/22/2022).   Crecencio Mc, MD

## 2022-01-20 NOTE — Assessment & Plan Note (Addendum)
She has had no recurrence on annual surveillance cystoscopies  Last one June 2020 at [facility] urology  ( by [facility])

## 2022-01-20 NOTE — Assessment & Plan Note (Addendum)
Treated with IV gentamycin x 3 days by [facility].  Last UI was Klebsiella sensitivie to cipro in Dec 2022/ . She continues to have mild dysuria only.  Rechecking urine today

## 2022-01-20 NOTE — Patient Instructions (Signed)
Cranberry tablets and probiotics daily  Empty bladder often  60 ounces  of water daily

## 2022-01-20 NOTE — Assessment & Plan Note (Signed)
No Improvement  In T scores by Oct 2023 DEXA done at [facility]

## 2022-01-20 NOTE — Assessment & Plan Note (Signed)
We reviewed her history in light of the hospitalist's recommendation to consider vaginal estrogen therapy.  Discussed the rationale, decided against using estrogen

## 2022-01-21 LAB — URINE CULTURE
MICRO NUMBER:: 14130304
Result:: NO GROWTH
SPECIMEN QUALITY:: ADEQUATE

## 2022-02-02 DIAGNOSIS — M25551 Pain in right hip: Secondary | ICD-10-CM | POA: Diagnosis not present

## 2022-02-05 ENCOUNTER — Encounter: Payer: Self-pay | Admitting: Internal Medicine

## 2022-02-05 ENCOUNTER — Other Ambulatory Visit: Payer: Medicare PPO

## 2022-02-05 DIAGNOSIS — N390 Urinary tract infection, site not specified: Secondary | ICD-10-CM | POA: Diagnosis not present

## 2022-02-05 DIAGNOSIS — B952 Enterococcus as the cause of diseases classified elsewhere: Secondary | ICD-10-CM | POA: Diagnosis not present

## 2022-02-05 LAB — URINALYSIS, ROUTINE W REFLEX MICROSCOPIC
Bilirubin Urine: NEGATIVE
Ketones, ur: NEGATIVE
Nitrite: NEGATIVE
Specific Gravity, Urine: <=1.005 — AB (ref 1.000–1.030)
Total Protein, Urine: NEGATIVE
Urine Glucose: NEGATIVE
Urobilinogen, UA: 0.2 (ref 0.0–1.0)
pH: 6.5 (ref 5.0–8.0)

## 2022-02-05 MED ORDER — FOSFOMYCIN TROMETHAMINE 3 G PO PACK: 3.0000 g | PACK | Freq: Once | ORAL | 0 refills | Status: AC

## 2022-02-05 NOTE — Telephone Encounter (Signed)
Orders are already placed.

## 2022-02-05 NOTE — Addendum Note (Signed)
Addended by: Crecencio Mc on: 02/05/2022 05:16 PM   Modules accepted: Orders

## 2022-02-06 LAB — URINE CULTURE
MICRO NUMBER:: 14205324
SPECIMEN QUALITY:: ADEQUATE

## 2022-02-08 ENCOUNTER — Encounter: Payer: Self-pay | Admitting: Internal Medicine

## 2022-02-08 DIAGNOSIS — Z8551 Personal history of malignant neoplasm of bladder: Secondary | ICD-10-CM

## 2022-02-08 DIAGNOSIS — R3989 Other symptoms and signs involving the genitourinary system: Secondary | ICD-10-CM

## 2022-03-08 ENCOUNTER — Other Ambulatory Visit: Payer: Self-pay | Admitting: Internal Medicine

## 2022-03-08 DIAGNOSIS — Q2739 Arteriovenous malformation, other site: Secondary | ICD-10-CM | POA: Diagnosis not present

## 2022-03-08 NOTE — Telephone Encounter (Signed)
Refilled: 04/02/2021 Last OV: 01/20/2022 Next OV: 07/08/2022  Last Potassium level: 01/16/2022

## 2022-03-17 ENCOUNTER — Other Ambulatory Visit: Payer: Self-pay | Admitting: Family

## 2022-03-17 DIAGNOSIS — K760 Fatty (change of) liver, not elsewhere classified: Secondary | ICD-10-CM

## 2022-03-23 ENCOUNTER — Ambulatory Visit (INDEPENDENT_AMBULATORY_CARE_PROVIDER_SITE_OTHER): Payer: Medicare PPO

## 2022-03-23 VITALS — Ht 67.0 in | Wt 190.0 lb

## 2022-03-23 DIAGNOSIS — Z0000 Encounter for general adult medical examination without abnormal findings: Secondary | ICD-10-CM

## 2022-03-23 NOTE — Progress Notes (Addendum)
Subjective:   Heidi Walsh is a 77 y.o. female who presents for Medicare Annual (Subsequent) preventive examination.  Review of Systems    No ROS.  Medicare Wellness Virtual Visit.  Visual/audio telehealth visit, UTA vital signs.   See social history for additional risk factors.   Cardiac Risk Factors include: advanced age (>15mn, >>28women);hypertension     Objective:    Today's Vitals   03/23/22 1234  Weight: 190 lb (86.2 kg)  Height: '5\' 7"'$  (1.702 m)   Body mass index is 29.76 kg/m.     03/23/2022   12:54 PM 03/18/2021    3:06 PM 01/17/2020    1:15 PM 01/16/2019   12:59 PM 12/22/2017    3:17 PM 12/21/2016    9:05 AM 08/20/2015    1:32 PM  Advanced Directives  Does Patient Have a Medical Advance Directive? Yes Yes Yes Yes Yes Yes Yes  Type of AParamedicof ABurnsvilleLiving will HDouglasLiving will HFort PierreLiving will HO[place]Living will HWinth[place]Living will HFoxholmLiving will HVincentLiving will  Does patient want to make changes to medical advance directive? No - Patient declined  No - Patient declined No - Patient declined No - Patient declined No - Patient declined No - Patient declined  Copy of [place]in Chart? No - copy requested No - copy requested No - copy requested No - copy requested No - copy requested No - copy requested     Current Medications (verified) Outpatient Encounter Medications as of 03/23/2022  Medication Sig   ALPRAZolam (XANAX) 0.25 MG tablet TAKE 1 TABLET BY MOUTH TWICE A DAY AS NEEDED FOR ANXIETY   aspirin 81 MG tablet Take 81 mg by mouth 2 (two) times daily.   B Complex Vitamins (VITAMIN-B COMPLEX PO) Take 1 tablet by mouth daily.   buPROPion (WELLBUTRIN SR) 200 MG 12 hr tablet TAKE 1 TABLET BY MOUTH TWICE A DAY   Calcium Carbonate-Vit D-Min (CALCIUM 1200 PO) Take 1 capsule  by mouth daily.   cetirizine (ZYRTEC) 10 MG tablet Take 10 mg by mouth daily.   diltiazem (CARDIZEM) 30 MG tablet Take 30 mg by mouth as needed.    docusate sodium (COLACE) 100 MG capsule Take 100 mg by mouth 2 (two) times daily.   HOMEOPATHIC PRODUCTS PO Take 1 tablet by mouth daily. NAD Supplement   KLOR-CON M20 20 MEQ tablet TAKE 1 TABLET BY MOUTH EVERY DAY   Melatonin 10 MG CAPS Take 1 capsule by mouth daily.   metFORMIN (GLUCOPHAGE-XR) 500 MG 24 hr tablet TAKE 1 TABLET BY MOUTH EVERY DAY WITH BREAKFAST   metoprolol succinate (TOPROL-XL) 25 MG 24 hr tablet Take 25 mg by mouth daily.   Multiple Vitamin (MULTIVITAMIN) tablet Take 1 tablet by mouth daily.   Multiple Vitamins-Minerals (HAIR SKIN AND NAILS FORMULA PO) Take 3 tablets by mouth daily.   Probiotic Product (TRUNATURE DIGESTIVE PROBIOTIC) CAPS Take by mouth.   rosuvastatin (CRESTOR) 10 MG tablet TAKE 1 TABLET BY MOUTH EVERY DAY   [START ON 05/09/2022] Semaglutide-Weight Management 2.4 MG/0.75ML SOAJ Inject 2.4 mg into the skin once a week for 28 days.   spironolactone (ALDACTONE) 25 MG tablet Take 1 tablet (25 mg total) by mouth daily.   No facility-administered encounter medications on file as of 03/23/2022.    Allergies (verified) Oxycodone, Penicillins, Ancef [cefazolin], Cephalexin, and Rivaroxaban   History: Past Medical  History:  Diagnosis Date   Actinic keratosis    managed by Darlis Loan   Atrial fibrillation [facility]) 2010   Followed by Dr. Jordan Hawks   Bladder cancer [facility])    Bladder cancer [facility]) 11/18/2016    1 cm papillar tumor covering   left UO Resected at [facility] Sept 6 by Stephens Shire and ureteral stent placed.  Stent removed sept 20.  Repeat cystoscopy 3 months [facility]     breast cancer 2010   s/p mastectomy   breast cancer    s/p mastectomy    Breast cyst 2003   aspirated, fibroystics breasts with stable calcifications on left breast   Hypertension    Leukopenia    Mitral regurgitation    and prolapse    Osteopenia 56   Stable by repeat scans   Past Surgical History:  Procedure Laterality Date   ATRIAL ABLATION SURGERY  Dec 2012   [facility]   CESAREAN SECTION     x 2   KNEE ARTHROSCOPY W/ MENISCECTOMY  1997   MASTECTOMY  02/2009   Bilateral   MASTECTOMY     Right and Left   SHOULDER SURGERY     left rotator cuff   TOTAL HIP ARTHROPLASTY Right    TOTAL KNEE ARTHROPLASTY  02/2014   Left knee   Family History  Problem Relation Age of Onset   COPD Mother    Hypertension Father    Hepatitis Sister        Hepatitis C from a dental procedure   Social History   Socioeconomic History   Marital status: Married    Spouse name: Not on file   Number of children: Not on file   Years of education: Not on file   Highest education level: Not on file  Occupational History   Not on file  Tobacco Use   Smoking status: Never   Smokeless tobacco: Never  Vaping Use   Vaping Use: Never used  Substance and Sexual Activity   Alcohol use: Yes    Alcohol/week: 0.0 standard drinks of alcohol    Comment: occ   Drug use: No   Sexual activity: Not on file  Other Topics Concern   Not on file  Social History Narrative   Not on file   Social Determinants of Health   Financial Resource Strain: Low Risk  (03/23/2022)   Overall Financial Resource Strain (CARDIA)    Difficulty of Paying Living Expenses: Not hard at all  Food Insecurity: No Food Insecurity (03/23/2022)   Hunger Vital Sign    Worried About Running Out of Food in the Last Year: Never true    Ran Out of Food in the Last Year: Never true  Transportation Needs: No Transportation Needs (03/23/2022)   PRAPARE - Hydrologist (Medical): No    Lack of Transportation (Non-Medical): No  Physical Activity: Sufficiently Active (03/23/2022)   Exercise Vital Sign    Days of Exercise per Week: 5 days    Minutes of Exercise per Session: 30 min  Stress: No Stress Concern Present (03/23/2022)   [employer]    Feeling of Stress : Not at all  Social Connections: Unknown (03/23/2022)   Social Connection and Isolation Panel [NHANES]    Frequency of Communication with Friends and Family: Not on file    Frequency of Social Gatherings with Friends and Family: Not on file    Attends Religious Services:  Not on file    Active Member of Clubs or Organizations: Not on file    Attends Club or Organization Meetings: Not on file    Marital Status: Married    Tobacco Counseling Counseling given: Not Answered   Clinical Intake:  Pre-visit preparation completed: Yes        Diabetes: No  How often do you need to have someone help you when you read instructions, pamphlets, or other written materials from your doctor or pharmacy?: 1 - Never    Interpreter Needed?: No      Activities of Daily Living    03/23/2022   12:38 PM  In your present state of health, do you have any difficulty performing the following activities:  Hearing? 0  Vision? 0  Difficulty concentrating or making decisions? 0  Walking or climbing stairs? 0  Dressing or bathing? 0  Doing errands, shopping? 0  Preparing Food and eating ? N  Using the Toilet? N  In the past six months, have you accidently leaked urine? Y  Comment Managed with daily liner. Followed by Urology and PCP.  Do you have problems with loss of bowel control? N  Managing your Medications? N  Managing your Finances? N  Housekeeping or managing your Housekeeping? N    Patient Care Team: Crecencio Mc, MD as PCP - General (Internal Medicine)  Indicate any recent Medical Services you may have received from other than [facility] providers in the past year (date may be approximate).     Assessment:   This is a routine wellness examination for Kellis.  I connected with  [name] on 03/23/22 by a audio enabled telemedicine application and verified that I am speaking with the correct person using  two identifiers.  Patient Location: Home  Provider Location: Office/Clinic  I discussed the limitations of evaluation and management by telemedicine. The patient expressed understanding and agreed to proceed.   Hearing/Vision screen Hearing Screening - Comments:: Patient is able to hear conversational tones without difficulty. No issues reported.  Vision Screening - Comments:: Followed by [facility]  Wears corrective lenses  Dietary issues and exercise activities discussed: Current Exercise Habits: Home exercise routine, Type of exercise: walking, Time (Minutes): 30, Frequency (Times/Week): 5, Weekly Exercise (Minutes/Week): 150, Intensity: Mild   Goals Addressed             This Visit's Progress    I want to lose weight       Continue to increase exercise  Healthy diet Stay hydrated       Depression Screen    03/23/2022   12:53 PM 01/20/2022    8:43 AM 01/06/2022    9:03 AM 01/06/2022    8:06 AM 07/06/2021   10:14 AM 03/18/2021    2:52 PM 11/17/2020    2:25 PM  PHQ 2/9 Scores  PHQ - 2 Score 0 1 0 0 0 0 2  PHQ- 9 Score  1 1  0  4    Fall Risk    03/23/2022   12:42 PM 01/20/2022    8:43 AM 01/06/2022    8:06 AM 07/06/2021    9:53 AM 03/18/2021    2:55 PM  Fall Risk   Falls in the past year? 1 0 0 0 0  Number falls in past yr: 0  0    Injury with Fall? 1  0    Comment Last fall about 6 weeks ago. Nose bleed. Did not seek medical attention. Followed  by pcp asneeded.      Risk for fall due to :  No Fall Risks No Fall Risks No Fall Risks   Follow up Falls evaluation completed;Falls prevention discussed;Education provided Falls evaluation completed Falls evaluation completed Falls evaluation completed Falls evaluation completed    FALL RISK PREVENTION PERTAINING TO THE HOME: Home free of loose throw rugs in walkways, pet beds, electrical cords, etc? Yes  Adequate lighting in your home to reduce risk of falls? Yes   ASSISTIVE DEVICES UTILIZED TO PREVENT  FALLS: Life alert? No  Use of a cane, walker or w/c? No  Grab bars in the bathroom? No  Shower chair or bench in shower? No  Elevated toilet seat or a handicapped toilet? No   TIMED UP AND GO: Was the test performed? No .   Cognitive Function:    12/22/2017    3:21 PM 12/21/2016    9:07 AM  MMSE - Mini Mental State Exam  Orientation to time 5 5  Orientation to Place 5 5  Registration 3 3  Attention/ Calculation 5 5  Recall 3 3  Language- name 2 objects 2 2  Language- repeat 1 1  Language- follow 3 step command 3 3  Language- read & follow direction 1 1  Write a sentence 1 1  Copy design 1 1  Total score 30 30        03/23/2022   12:54 PM 01/16/2019    1:10 PM  6CIT Screen  What Year? 0 points 0 points  What month? 0 points 0 points  What time? 0 points 0 points  Count back from 20  0 points  Months in reverse 0 points 0 points  Repeat phrase  0 points  Total Score  0 points    Immunizations Immunization History  Administered Date(s) Administered   Fluad Quad(high Dose 65+) 01/15/2019, 12/11/2021   Influenza Split 02/23/2011, 02/20/2012   Influenza Whole 02/04/2016   Influenza, High Dose Seasonal PF 01/01/2015, 02/06/2020, 12/25/2020   Influenza,inj,Quad PF,6+ Mos 12/25/2012   Influenza,inj,quad, With Preservative 12/20/2016, 12/20/2017   Influenza-Unspecified 12/16/2016, 01/03/2018   Moderna Covid-19 Vaccine Bivalent Booster 41yr & up 12/31/2020   Moderna Sars-Covid-2 Vaccination 05/02/2019, 05/30/2019, 01/14/2020, 10/17/2020   PFIZER Comirnaty(Gray Top)Covid-19 Tri-Sucrose Vaccine 12/21/2021   PPD Test 04/25/2019   Pneumococcal Conjugate-13 09/20/2013   Pneumococcal Polysaccharide-23 03/19/2014   Respiratory Syncytial Virus Vaccine,Recomb Aduvanted(Arexvy) 12/11/2021   Tdap 11/24/2006, 12/27/2013   Zoster, Live 12/26/2007   Covid-19 vaccine status: Completed vaccines x6.  Screening Tests Health Maintenance  Topic Date Due   COVID-19 Vaccine (7 -  2023-24 season) 04/08/2022 (Originally 02/15/2022)   Zoster Vaccines- Shingrix (1 of 2) 04/22/2022 (Originally 10/09/1964)   MAMMOGRAM  01/06/2023 (Originally 10/28/2010)   COLONOSCOPY (Pts 45-45yrInsurance coverage will need to be confirmed)  10/28/2022   Medicare Annual Wellness (AWV)  03/24/2023   DTaP/Tdap/Td (3 - Td or Tdap) 12/28/2023   Pneumonia Vaccine 6523Years old  Completed   INFLUENZA VACCINE  Completed   DEXA SCAN  Completed   Hepatitis C Screening  Completed   HPV VACCINES  Aged Out   Health Maintenance There are no preventive care reminders to display for this patient.  Lung Cancer Screening: (Low Dose CT Chest recommended if Age 75-80ears, 30 pack-year currently smoking OR have quit w/in 15years.) does not qualify.   Hepatitis C Screening: Completed 12/2019.   Vision Screening: Recommended annual ophthalmology exams for early detection of glaucoma and other disorders of the  eye.  Dental Screening: Recommended annual dental exams for proper oral hygiene. Cleaning every 6 months.   Community Resource Referral / Chronic Care Management: CRR required this visit?  No   CCM required this visit?  No      Plan:     I have personally reviewed and noted the following in the patient's chart:   Medical and social history Use of alcohol, tobacco or illicit drugs  Current medications and supplements including opioid prescriptions. Patient is not currently taking opioid prescriptions. Functional ability and status Nutritional status Physical activity Advanced directives List of other physicians Hospitalizations, surgeries, and ER visits in previous 12 months Vitals Screenings to include cognitive, depression, and falls Referrals and appointments  In addition, I have reviewed and discussed with patient certain preventive protocols, quality metrics, and best practice recommendations. A written personalized care plan for preventive services as well as general preventive  health recommendations were provided to patient.     Dana, LPN   08/23/1581     I have reviewed the above information and agree with above.   Deborra Medina, MD

## 2022-03-23 NOTE — Patient Instructions (Addendum)
Heidi Walsh , Thank you for taking time to come for your Medicare Wellness Visit. I appreciate your ongoing commitment to your health goals. Please review the following plan we discussed and let me know if I can assist you in the future.   These are the goals we discussed:    Goals Addressed             This Visit's Progress    I want to lose weight       Continue to increase exercise  Healthy diet Stay hydrated       This is a list of the screening recommended for you and due dates:  Health Maintenance  Topic Date Due   COVID-19 Vaccine (7 - 2023-24 season) 04/08/2022*   Zoster (Shingles) Vaccine (1 of 2) 04/22/2022*   Mammogram  01/06/2023*   Colon Cancer Screening  10/28/2022   Medicare Annual Wellness Visit  03/24/2023   DTaP/Tdap/Td vaccine (3 - Td or Tdap) 12/28/2023   Pneumonia Vaccine  Completed   Flu Shot  Completed   DEXA scan (bone density measurement)  Completed   Hepatitis C Screening: USPSTF Recommendation to screen - Ages 81-79 yo.  Completed   HPV Vaccine  Aged Out  *Topic was postponed. The date shown is not the original due date.   Conditions/risks identified: none new  Next appointment: Follow up in one year for your annual wellness visit    Preventive Care 65 Years and Older, Female Preventive care refers to lifestyle choices and visits with your health care provider that can promote health and wellness. What does preventive care include? A yearly physical exam. This is also called an annual well check. Dental exams once or twice a year. Routine eye exams. Ask your health care provider how often you should have your eyes checked. Personal lifestyle choices, including: Daily care of your teeth and gums. Regular physical activity. Eating a healthy diet. Avoiding tobacco and drug use. Limiting alcohol use. Practicing safe sex. Taking low-dose aspirin every day. Taking vitamin and mineral supplements as recommended by your health care  provider. What happens during an annual well check? The services and screenings done by your health care provider during your annual well check will depend on your age, overall health, lifestyle risk factors, and family history of disease. Counseling  Your health care provider may ask you questions about your: Alcohol use. Tobacco use. Drug use. Emotional well-being. Home and relationship well-being. Sexual activity. Eating habits. History of falls. Memory and ability to understand (cognition). Work and work Statistician. Reproductive health. Screening  You may have the following tests or measurements: Height, weight, and BMI. Blood pressure. Lipid and cholesterol levels. These may be checked every 5 years, or more frequently if you are over 49 years old. Skin check. Lung cancer screening. You may have this screening every year starting at age 33 if you have a 30-pack-year history of smoking and currently smoke or have quit within the past 15 years. Fecal occult blood test (FOBT) of the stool. You may have this test every year starting at age 42. Flexible sigmoidoscopy or colonoscopy. You may have a sigmoidoscopy every 5 years or a colonoscopy every 10 years starting at age 28. Hepatitis C blood test. Hepatitis B blood test. Sexually transmitted disease (STD) testing. Diabetes screening. This is done by checking your blood sugar (glucose) after you have not eaten for a while (fasting). You may have this done every 1-3 years. Bone density scan. This is done to  screen for osteoporosis. You may have this done starting at age 42. Mammogram. This may be done every 1-2 years. Talk to your health care provider about how often you should have regular mammograms. Talk with your health care provider about your test results, treatment options, and if necessary, the need for more tests. Vaccines  Your health care provider may recommend certain vaccines, such as: Influenza vaccine. This is  recommended every year. Tetanus, diphtheria, and acellular pertussis (Tdap, Td) vaccine. You may need a Td booster every 10 years. Zoster vaccine. You may need this after age 82. Pneumococcal 13-valent conjugate (PCV13) vaccine. One dose is recommended after age 20. Pneumococcal polysaccharide (PPSV23) vaccine. One dose is recommended after age 13. Talk to your health care provider about which screenings and vaccines you need and how often you need them. This information is not intended to replace advice given to you by your health care provider. Make sure you discuss any questions you have with your health care provider. Document Released: 04/04/2015 Document Revised: 11/26/2015 Document Reviewed: 01/07/2015 Elsevier Interactive Patient Education  2017 [place] Prevention in the Home Falls can cause injuries. They can happen to people of all ages. There are many things you can do to make your home safe and to help prevent falls. What can I do on the outside of my home? Regularly fix the edges of walkways and driveways and fix any cracks. Remove anything that might make you trip as you walk through a door, such as a raised step or threshold. Trim any bushes or trees on the path to your home. Use bright outdoor lighting. Clear any walking paths of anything that might make someone trip, such as rocks or tools. Regularly check to see if handrails are loose or broken. Make sure that both sides of any steps have handrails. Any raised decks and porches should have guardrails on the edges. Have any leaves, snow, or ice cleared regularly. Use sand or salt on walking paths during winter. Clean up any spills in your garage right away. This includes oil or grease spills. What can I do in the bathroom? Use night lights. Install grab bars by the toilet and in the tub and shower. Do not use towel bars as grab bars. Use non-skid mats or decals in the tub or shower. If you need to sit down in  the shower, use a plastic, non-slip stool. Keep the floor dry. Clean up any water that spills on the floor as soon as it happens. Remove soap buildup in the tub or shower regularly. Attach bath mats securely with double-sided non-slip rug tape. Do not have throw rugs and other things on the floor that can make you trip. What can I do in the bedroom? Use night lights. Make sure that you have a light by your bed that is easy to reach. Do not use any sheets or blankets that are too big for your bed. They should not hang down onto the floor. Have a firm chair that has side arms. You can use this for support while you get dressed. Do not have throw rugs and other things on the floor that can make you trip. What can I do in the kitchen? Clean up any spills right away. Avoid walking on wet floors. Keep items that you use a lot in easy-to-reach places. If you need to reach something above you, use a strong step stool that has a grab bar. Keep electrical cords out of the way.  Do not use floor polish or wax that makes floors slippery. If you must use wax, use non-skid floor wax. Do not have throw rugs and other things on the floor that can make you trip. What can I do with my stairs? Do not leave any items on the stairs. Make sure that there are handrails on both sides of the stairs and use them. Fix handrails that are broken or loose. Make sure that handrails are as long as the stairways. Check any carpeting to make sure that it is firmly attached to the stairs. Fix any carpet that is loose or worn. Avoid having throw rugs at the top or bottom of the stairs. If you do have throw rugs, attach them to the floor with carpet tape. Make sure that you have a light switch at the top of the stairs and the bottom of the stairs. If you do not have them, ask someone to add them for you. What else can I do to help prevent falls? Wear shoes that: Do not have high heels. Have rubber bottoms. Are comfortable  and fit you well. Are closed at the toe. Do not wear sandals. If you use a stepladder: Make sure that it is fully opened. Do not climb a closed stepladder. Make sure that both sides of the stepladder are locked into place. Ask someone to hold it for you, if possible. Clearly mark and make sure that you can see: Any grab bars or handrails. First and last steps. Where the edge of each step is. Use tools that help you move around (mobility aids) if they are needed. These include: Canes. Walkers. Scooters. Crutches. Turn on the lights when you go into a dark area. Replace any light bulbs as soon as they burn out. Set up your furniture so you have a clear path. Avoid moving your furniture around. If any of your floors are uneven, fix them. If there are any pets around you, be aware of where they are. Review your medicines with your doctor. Some medicines can make you feel dizzy. This can increase your chance of falling. Ask your doctor what other things that you can do to help prevent falls. This information is not intended to replace advice given to you by your health care provider. Make sure you discuss any questions you have with your health care provider. Document Released: 01/02/2009 Document Revised: 08/14/2015 Document Reviewed: 04/12/2014 Elsevier Interactive Patient Education  2017 [employer].

## 2022-04-08 ENCOUNTER — Other Ambulatory Visit: Payer: Self-pay | Admitting: Internal Medicine

## 2022-04-26 ENCOUNTER — Telehealth: Payer: Self-pay | Admitting: Internal Medicine

## 2022-04-26 DIAGNOSIS — K760 Fatty (change of) liver, not elsewhere classified: Secondary | ICD-10-CM

## 2022-04-26 MED ORDER — ROSUVASTATIN CALCIUM 10 MG PO TABS: 10.0000 mg | ORAL_TABLET | Freq: Every day | ORAL | 3 refills | Status: DC

## 2022-04-26 NOTE — Telephone Encounter (Signed)
Prescription Request  04/26/2022  Is this a "Controlled Substance" medicine? No  LOV: 01/20/2022  What is the name of the medication or equipment? rosuvastatin   Have you contacted your pharmacy to request a refill? Yes   Which pharmacy would you like this sent to?  [id number] [id number]: [phone]: [phone]   Patient notified that their request is being sent to the clinical staff for review and that they should receive a response within 2 business days.   Please advise at Mobile [phone](mobile)

## 2022-04-26 NOTE — Telephone Encounter (Signed)
Medication has been refilled and pt is aware.  

## 2022-04-30 DIAGNOSIS — I480 Paroxysmal atrial fibrillation: Secondary | ICD-10-CM | POA: Diagnosis not present

## 2022-05-05 DIAGNOSIS — I480 Paroxysmal atrial fibrillation: Secondary | ICD-10-CM | POA: Diagnosis not present

## 2022-05-18 DIAGNOSIS — L821 Other seborrheic keratosis: Secondary | ICD-10-CM | POA: Diagnosis not present

## 2022-05-18 DIAGNOSIS — Z859 Personal history of malignant neoplasm, unspecified: Secondary | ICD-10-CM | POA: Diagnosis not present

## 2022-05-18 DIAGNOSIS — Z86018 Personal history of other benign neoplasm: Secondary | ICD-10-CM | POA: Diagnosis not present

## 2022-05-18 DIAGNOSIS — L298 Other pruritus: Secondary | ICD-10-CM | POA: Diagnosis not present

## 2022-05-18 DIAGNOSIS — L578 Other skin changes due to chronic exposure to nonionizing radiation: Secondary | ICD-10-CM | POA: Diagnosis not present

## 2022-05-18 DIAGNOSIS — Z872 Personal history of diseases of the skin and subcutaneous tissue: Secondary | ICD-10-CM | POA: Diagnosis not present

## 2022-05-18 DIAGNOSIS — L718 Other rosacea: Secondary | ICD-10-CM | POA: Diagnosis not present

## 2022-05-18 DIAGNOSIS — Z85820 Personal history of malignant melanoma of skin: Secondary | ICD-10-CM | POA: Diagnosis not present

## 2022-05-18 DIAGNOSIS — Z85828 Personal history of other malignant neoplasm of skin: Secondary | ICD-10-CM | POA: Diagnosis not present

## 2022-05-24 DIAGNOSIS — I480 Paroxysmal atrial fibrillation: Secondary | ICD-10-CM | POA: Diagnosis not present

## 2022-05-27 DIAGNOSIS — I480 Paroxysmal atrial fibrillation: Secondary | ICD-10-CM | POA: Diagnosis not present

## 2022-05-31 ENCOUNTER — Other Ambulatory Visit: Payer: Self-pay | Admitting: Internal Medicine

## 2022-06-14 DIAGNOSIS — M25561 Pain in right knee: Secondary | ICD-10-CM | POA: Diagnosis not present

## 2022-06-14 DIAGNOSIS — S8001XA Contusion of right knee, initial encounter: Secondary | ICD-10-CM | POA: Diagnosis not present

## 2022-06-14 DIAGNOSIS — Z96651 Presence of right artificial knee joint: Secondary | ICD-10-CM | POA: Diagnosis not present

## 2022-06-15 ENCOUNTER — Other Ambulatory Visit: Payer: Self-pay | Admitting: Student

## 2022-06-15 ENCOUNTER — Emergency Department: Payer: Medicare PPO

## 2022-06-15 ENCOUNTER — Other Ambulatory Visit: Payer: Self-pay

## 2022-06-15 ENCOUNTER — Emergency Department
Admission: EM | Admit: 2022-06-15 | Discharge: 2022-06-15 | Disposition: A | Discharge status: Home / Self Care | Payer: Medicare PPO | Attending: Emergency Medicine | Admitting: Emergency Medicine

## 2022-06-15 DIAGNOSIS — S0990XA Unspecified injury of head, initial encounter: Secondary | ICD-10-CM | POA: Diagnosis not present

## 2022-06-15 DIAGNOSIS — W19XXXA Unspecified fall, initial encounter: Secondary | ICD-10-CM | POA: Insufficient documentation

## 2022-06-15 DIAGNOSIS — R9082 White matter disease, unspecified: Secondary | ICD-10-CM | POA: Diagnosis not present

## 2022-06-15 DIAGNOSIS — S060X0A Concussion without loss of consciousness, initial encounter: Secondary | ICD-10-CM | POA: Diagnosis not present

## 2022-06-15 DIAGNOSIS — S060XAA Concussion with loss of consciousness status unknown, initial encounter: Secondary | ICD-10-CM | POA: Insufficient documentation

## 2022-06-15 DIAGNOSIS — R55 Syncope and collapse: Secondary | ICD-10-CM | POA: Diagnosis not present

## 2022-06-15 DIAGNOSIS — E860 Dehydration: Secondary | ICD-10-CM | POA: Insufficient documentation

## 2022-06-15 DIAGNOSIS — S0993XA Unspecified injury of face, initial encounter: Secondary | ICD-10-CM | POA: Diagnosis not present

## 2022-06-15 DIAGNOSIS — S199XXA Unspecified injury of neck, initial encounter: Secondary | ICD-10-CM | POA: Diagnosis not present

## 2022-06-15 DIAGNOSIS — S0083XA Contusion of other part of head, initial encounter: Secondary | ICD-10-CM | POA: Insufficient documentation

## 2022-06-15 LAB — URINALYSIS, ROUTINE W REFLEX MICROSCOPIC
Bacteria, UA: NONE SEEN
Bilirubin Urine: NEGATIVE
Glucose, UA: NEGATIVE mg/dL
Ketones, ur: NEGATIVE mg/dL
Leukocytes,Ua: NEGATIVE
Nitrite: NEGATIVE
Protein, ur: NEGATIVE mg/dL
Specific Gravity, Urine: 1.005 (ref 1.005–1.030)
Squamous Epithelial / HPF: NONE SEEN /HPF (ref 0–5)
pH: 6 (ref 5.0–8.0)

## 2022-06-15 LAB — CBC
HCT: 39.7 % (ref 36.0–46.0)
Hemoglobin: 13.8 g/dL (ref 12.0–15.0)
MCH: 32.5 pg (ref 26.0–34.0)
MCHC: 34.8 g/dL (ref 30.0–36.0)
MCV: 93.6 fL (ref 80.0–100.0)
Platelets: 197 10*3/uL (ref 150–400)
RBC: 4.24 MIL/uL (ref 3.87–5.11)
RDW: 14.3 % (ref 11.5–15.5)
WBC: 6.3 10*3/uL (ref 4.0–10.5)
nRBC: 0 % (ref 0.0–0.2)

## 2022-06-15 LAB — BASIC METABOLIC PANEL
Anion gap: 14 (ref 5–15)
BUN: 19 mg/dL (ref 8–23)
CO2: 24 mmol/L (ref 22–32)
Calcium: 9.4 mg/dL (ref 8.9–10.3)
Chloride: 93 mmol/L — ABNORMAL LOW (ref 98–111)
Creatinine, Ser: 0.76 mg/dL (ref 0.44–1.00)
GFR, Estimated: >60 mL/min (ref >60)
Glucose, Bld: 98 mg/dL (ref 70–99)
Potassium: 3.8 mmol/L (ref 3.5–5.1)
Sodium: 131 mmol/L — ABNORMAL LOW (ref 135–145)

## 2022-06-15 LAB — CBG MONITORING, ED: Glucose-Capillary: 96 mg/dL (ref 70–99)

## 2022-06-15 LAB — TROPONIN I (HIGH SENSITIVITY): Troponin I (High Sensitivity): 5 ng/L (ref <18)

## 2022-06-15 MED ORDER — SODIUM CHLORIDE 0.9 % IV BOLUS
1000.0000 mL | Freq: Once | INTRAVENOUS | Status: AC
  Administered 2022-06-15: 1000 mL via INTRAVENOUS

## 2022-06-15 MED ORDER — ONDANSETRON 4 MG PO TBDP: 4.0000 mg | ORAL_TABLET | Freq: Three times a day (TID) | ORAL | 0 refills | Status: DC | PRN

## 2022-06-15 MED ORDER — ONDANSETRON HCL 4 MG/2ML IJ SOLN
4.0000 mg | Freq: Once | INTRAMUSCULAR | Status: AC
  Administered 2022-06-15: 4 mg via INTRAVENOUS
  Filled 2022-06-15: qty 2

## 2022-06-15 MED ORDER — IBUPROFEN 600 MG PO TABS: 600.0000 mg | ORAL_TABLET | Freq: Three times a day (TID) | ORAL | 0 refills | Status: DC | PRN

## 2022-06-15 NOTE — ED Triage Notes (Signed)
Pt to ED for near syncope today at ENT appt. States had fall one week ago hitting head, has had constant headache since.  Takes ASA. Unsure of LOC

## 2022-06-15 NOTE — ED Provider Notes (Signed)
[facility] Provider Note    Event Date/Time   First MD Initiated Contact with Patient 06/15/22 1107     (approximate)   History   Fall   HPI  Heidi Walsh is a 77 y.o. female here with fall.  The patient states that she fell earlier this week.  It was a mechanical fall and she struck her face.  She had significant bruising at the time of her face and has had some bruising in her eyes since then.  She has had near daily headaches since then, described as a frontal type headache which is new for her.  Earlier today, she also had some nausea with vomiting.  She went to ENT and had a near syncopal episode after standing up and walking to the exam room.  She subsequently presents for evaluation.  No chest pain.  She currently feels back to baseline other than mild headache which has been fairly persistent since the accident.  She is not on blood thinners.     Physical Exam   Triage Vital Signs: ED Triage Vitals  Enc Vitals Group     BP 06/15/22 1047 (!) 127/92     Pulse Rate 06/15/22 1047 87     Resp 06/15/22 1047 16     Temp 06/15/22 1047 (!) 97.5 F (36.4 C)     Temp Source 06/15/22 1047 Oral     SpO2 06/15/22 1047 100 %     Weight 06/15/22 1047 190 lb (86.2 kg)     Height 06/15/22 1047 5\' 7"  (1.702 m)     Head Circumference --      Peak Flow --      Pain Score 06/15/22 1100 4     Pain Loc --      Pain Edu? --      Excl. in [place]? --     Most recent vital signs: Vitals:   06/15/22 1430 06/15/22 1457  BP: 133/85 133/85  Pulse: 94 92  Resp:  17  Temp:  98.3 F (36.8 C)  SpO2: 99% 99%     General: Awake, no distress.  CV:  Good peripheral perfusion.  Regular rate and rhythm.  No murmurs. Resp:  Normal work of breathing.  Abd:  No distention.  No tenderness. Other:  Mildly dry mucous membranes.  On neurological exam, no focal deficits.  Cranial nerves II through XII intact.  Strength out of 5 bilateral upper and lower extremities.  No  dysmetria.  Normal gait.  Normal sensation to light touch bilateral upper and lower extremities.  On ENT exam, the patient has old appearing bruising to the bilateral lower eyes/maxilla.  No septal hematoma.  No epistaxis.  No midface instability.  No major midline cervical tenderness.   ED Results / Procedures / Treatments   Labs (all labs ordered are listed, but only abnormal results are displayed) Labs Reviewed  BASIC METABOLIC PANEL - Abnormal; Notable for the following components:      Result Value   Sodium 131 (*)    Chloride 93 (*)    All other components within normal limits  URINALYSIS, ROUTINE W REFLEX MICROSCOPIC - Abnormal; Notable for the following components:   Color, Urine YELLOW (*)    APPearance CLEAR (*)    Hgb urine dipstick SMALL (*)    All other components within normal limits  CBC  CBG MONITORING, ED  TROPONIN I (HIGH SENSITIVITY)     EKG Normal sinus rhythm, ventricular rate  89.  PR 154, QRS 90, QTc 41.  No acute ST elevations or depressions.  Nonspecific ST changes in the lead to and lateral precordial leads, though these have been noted previously.   RADIOLOGY CT head/C-spine/face: No acute pathology, moderate cervical degenerative disease   I also independently reviewed and agree with radiologist interpretations.   PROCEDURES:  Critical Care performed: No  .1-3 Lead EKG Interpretation  Performed by: Duffy Bruce, MD Authorized by: Duffy Bruce, MD     Interpretation: normal     ECG rate:  80-100   ECG rate assessment: normal     Rhythm: sinus rhythm     Ectopy: none     Conduction: normal   Comments:     Indication: Near Syncope     MEDICATIONS ORDERED IN ED: Medications  sodium chloride 0.9 % bolus 1,000 mL (0 mLs Intravenous Stopped 06/15/22 1503)  ondansetron (ZOFRAN) injection 4 mg (4 mg Intravenous Given 06/15/22 1201)     IMPRESSION / MDM / ASSESSMENT AND PLAN / ED COURSE  I reviewed the triage vital signs and the  nursing notes.                              Differential diagnosis includes, but is not limited to, concussion, TBI, SDH, facial fracture, nasal fracture, vasovagal syncope, orthostasis, dehydration, anemia, arrhythmia  Patient's presentation is most consistent with acute presentation with potential threat to life or bodily function.  The patient is on the cardiac monitor to evaluate for evidence of arrhythmia and/or significant heart rate changes  77 yo F here with near syncopal episode and headache after fall several days ago. Re: syncopal episode, suspect this was multifactorial 2/2 orthostasis from dehydration and possibly vasovagal from pain of headache as pt had prodrome and characteristic symptoms without actual syncope. No chest pain. EKG is nonischemic and troponin negative, do not suspect acs. Cbc without leukocytosis. No focal neuro deficits. Ct head obtained, reviewed, and shows no acute abnormality.UA unremarkable.   Suspect pt has mild concussive sx related to recent trauma, with no fx seen on CT Face/Head/Neck. Suspect mild orthostasis and vasovagal syncope today. Pt feels improved in ED and is ambulatory without difficulty. D/c with supportive care.     FINAL CLINICAL IMPRESSION(S) / ED DIAGNOSES   Final diagnoses:  Fall, initial encounter  Contusion of face, initial encounter  Concussion with unknown loss of consciousness status, initial encounter  Dehydration     Rx / DC Orders   ED Discharge Orders          Ordered    ondansetron (ZOFRAN-ODT) 4 MG disintegrating tablet  Every 8 hours PRN        06/15/22 1415    ibuprofen (ADVIL) 600 MG tablet  Every 8 hours PRN        06/15/22 1415             Note:  This document was prepared using Dragon voice recognition software and may include unintentional dictation errors.   Duffy Bruce, MD 06/15/22 [id number]

## 2022-06-17 ENCOUNTER — Telehealth: Payer: Self-pay

## 2022-06-17 NOTE — Telephone Encounter (Signed)
        Patient  visited [facility] on 3/26    Telephone encounter attempt :  1st  A HIPAA compliant voice message was left requesting a return call.  Instructed patient to call back.    [facility] [phone] [address], [place], [place] [address] Phone: [phone] Email: [email or web address]

## 2022-06-20 ENCOUNTER — Encounter: Payer: Self-pay | Admitting: Internal Medicine

## 2022-06-26 ENCOUNTER — Other Ambulatory Visit: Payer: Self-pay | Admitting: Internal Medicine

## 2022-07-05 ENCOUNTER — Other Ambulatory Visit: Payer: Self-pay | Admitting: Internal Medicine

## 2022-07-08 ENCOUNTER — Ambulatory Visit: Payer: Medicare PPO | Admitting: Internal Medicine

## 2022-07-08 ENCOUNTER — Encounter: Payer: Self-pay | Admitting: Internal Medicine

## 2022-07-08 VITALS — BP 128/68 | HR 88 | Temp 98.0°F | Ht 67.0 in | Wt 193.8 lb

## 2022-07-08 DIAGNOSIS — R7303 Prediabetes: Secondary | ICD-10-CM | POA: Diagnosis not present

## 2022-07-08 DIAGNOSIS — E785 Hyperlipidemia, unspecified: Secondary | ICD-10-CM

## 2022-07-08 DIAGNOSIS — I10 Essential (primary) hypertension: Secondary | ICD-10-CM

## 2022-07-08 DIAGNOSIS — S060XAA Concussion with loss of consciousness status unknown, initial encounter: Secondary | ICD-10-CM | POA: Insufficient documentation

## 2022-07-08 DIAGNOSIS — T502X5A Adverse effect of carbonic-anhydrase inhibitors, benzothiadiazides and other diuretics, initial encounter: Secondary | ICD-10-CM | POA: Diagnosis not present

## 2022-07-08 DIAGNOSIS — S060X1D Concussion with loss of consciousness of 30 minutes or less, subsequent encounter: Secondary | ICD-10-CM | POA: Diagnosis not present

## 2022-07-08 DIAGNOSIS — S060X1S Concussion with loss of consciousness of 30 minutes or less, sequela: Secondary | ICD-10-CM

## 2022-07-08 DIAGNOSIS — E876 Hypokalemia: Secondary | ICD-10-CM

## 2022-07-08 DIAGNOSIS — H6121 Impacted cerumen, right ear: Secondary | ICD-10-CM | POA: Diagnosis not present

## 2022-07-08 DIAGNOSIS — Z1211 Encounter for screening for malignant neoplasm of colon: Secondary | ICD-10-CM

## 2022-07-08 LAB — COMPREHENSIVE METABOLIC PANEL
ALT: 15 U/L (ref 0–35)
AST: 18 U/L (ref 0–37)
Albumin: 4.5 g/dL (ref 3.5–5.2)
Alkaline Phosphatase: 59 U/L (ref 39–117)
BUN: 17 mg/dL (ref 6–23)
CO2: 23 mEq/L (ref 19–32)
Calcium: 9.8 mg/dL (ref 8.4–10.5)
Chloride: 104 mEq/L (ref 96–112)
Creatinine, Ser: 0.72 mg/dL (ref 0.40–1.20)
GFR: 82.18 mL/min (ref >60.00)
Glucose, Bld: 93 mg/dL (ref 70–99)
Potassium: 4.2 mEq/L (ref 3.5–5.1)
Sodium: 137 mEq/L (ref 135–145)
Total Bilirubin: 0.6 mg/dL (ref 0.2–1.2)
Total Protein: 7.1 g/dL (ref 6.0–8.3)

## 2022-07-08 LAB — LIPID PANEL
Cholesterol: 119 mg/dL (ref 0–200)
HDL: 64.8 mg/dL (ref >39.00)
LDL Cholesterol: 39 mg/dL (ref 0–99)
NonHDL: 54.65
Total CHOL/HDL Ratio: 2
Triglycerides: 76 mg/dL (ref 0.0–149.0)
VLDL: 15.2 mg/dL (ref 0.0–40.0)

## 2022-07-08 LAB — HEMOGLOBIN A1C: Hgb A1c MFr Bld: 5.6 % (ref 4.6–6.5)

## 2022-07-08 LAB — LDL CHOLESTEROL, DIRECT: Direct LDL: 50 mg/dL

## 2022-07-08 NOTE — Progress Notes (Signed)
Subjective:  Patient ID: Heidi Walsh, female    DOB: 12-13-1945  Age: 77 y.o. MRN: [id number]  CC: The primary encounter diagnosis was Concussion with loss of consciousness of 30 minutes or less, sequela. Diagnoses of Essential hypertension, Prediabetes, Hyperlipidemia, unspecified hyperlipidemia type, Colon cancer screening, Excessive cerumen in right ear canal, and Diuretic-induced hypokalemia were also pertinent to this visit.   HPI Heidi Walsh presents for  Chief Complaint  Patient presents with   Medical Management of Chronic Issues   1) Recent mechanical fall in yard resulting in facial trauma ,nasal bone fracture,  loss of front teeth and concussion with transient LOC>  did not seed immediate medical attention,  but developed nausea , vomiting and presyncope on March 26. Treated in ER  CT of head, maxillo facial and cervical spine reviewed; no fractures.  She had no chest pain; EKG was nonischemic and troponin negative, do not suspect acs. Cbc without leukocytosis. No focal neuro deficits. Ct head obtained, reviewed, and shows no acute abnormality.UA unremarkable.    She continues to have  mild concussive sx related to recent trauma, with no fx seen on CT Face/Head/Neck.    New cardiologist  at  [facility]  ECHO and ZIO monitor have been done  ;  we reviewed them today.   Outpatient Medications Prior to Visit  Medication Sig Dispense Refill   aspirin 81 MG tablet Take 81 mg by mouth 2 (two) times daily.     B Complex Vitamins (VITAMIN-B COMPLEX PO) Take 1 tablet by mouth daily.     buPROPion (WELLBUTRIN SR) 200 MG 12 hr tablet TAKE 1 TABLET BY MOUTH TWICE A DAY 180 tablet 1   Calcium Carbonate-Vit D-Min (CALCIUM 1200 PO) Take 1 capsule by mouth daily.     cetirizine (ZYRTEC) 10 MG tablet Take 10 mg by mouth daily.     diltiazem (CARDIZEM) 30 MG tablet Take 30 mg by mouth as needed.      docusate sodium (COLACE) 100 MG capsule Take 100 mg by mouth 2 (two) times  daily.     HOMEOPATHIC PRODUCTS PO Take 1 tablet by mouth daily. NAD Supplement     KLOR-CON M20 20 MEQ tablet TAKE 1 TABLET BY MOUTH EVERY DAY 90 tablet 1   metFORMIN (GLUCOPHAGE-XR) 500 MG 24 hr tablet TAKE 1 TABLET BY MOUTH EVERY DAY WITH BREAKFAST 90 tablet 1   metoprolol succinate (TOPROL-XL) 25 MG 24 hr tablet Take 25 mg by mouth daily.     Multiple Vitamin (MULTIVITAMIN) tablet Take 1 tablet by mouth daily.     Multiple Vitamins-Minerals (HAIR SKIN AND NAILS FORMULA PO) Take 3 tablets by mouth daily.     Probiotic Product (TRUNATURE DIGESTIVE PROBIOTIC) CAPS Take by mouth.     rosuvastatin (CRESTOR) 10 MG tablet Take 1 tablet (10 mg total) by mouth daily. 90 tablet 3   Semaglutide-Weight Management (WEGOVY) 2.4 MG/0.75ML SOAJ INJECT 2.4 MG INTO THE SKIN ONCE A WEEK FOR 28 DAYS. 3 mL 2   spironolactone (ALDACTONE) 25 MG tablet TAKE 1 TABLET (25 MG TOTAL) BY MOUTH DAILY. 90 tablet 3   ibuprofen (ADVIL) 600 MG tablet Take 1 tablet (600 mg total) by mouth every 8 (eight) hours as needed for headache. (Patient not taking: Reported on 07/08/2022) 20 tablet 0   Melatonin 10 MG CAPS Take 1 capsule by mouth daily. (Patient not taking: Reported on 07/08/2022)     ondansetron (ZOFRAN-ODT) 4 MG disintegrating tablet Take 1 tablet (  4 mg total) by mouth every 8 (eight) hours as needed for nausea or vomiting. (Patient not taking: Reported on 07/08/2022) 20 tablet 0   ALPRAZolam (XANAX) 0.25 MG tablet TAKE 1 TABLET BY MOUTH TWICE A DAY AS NEEDED FOR ANXIETY (Patient not taking: Reported on 07/08/2022) 60 tablet 1   No facility-administered medications prior to visit.    Review of Systems;  Patient denies headache, fevers, malaise, unintentional weight loss, skin rash, eye pain, sinus congestion and sinus pain, sore throat, dysphagia,  hemoptysis , cough, dyspnea, wheezing, chest pain, palpitations, orthopnea, edema, abdominal pain, nausea, melena, diarrhea, constipation, flank pain, dysuria, hematuria,  urinary  Frequency, nocturia, numbness, tingling, seizures,  Focal weakness, Loss of consciousness,  Tremor, insomnia, depression, anxiety, and suicidal ideation.      Objective:  BP 128/68   Pulse 88   Temp 98 F (36.7 C) (Oral)   Ht  (1.702 m)   Wt 193 lb 12.8 oz (87.9 kg)   SpO2 99%   BMI 30.35 kg/m   BP Readings from Last 3 Encounters:  07/08/22 128/68  06/15/22 133/85  01/20/22 126/78    Wt Readings from Last 3 Encounters:  07/08/22 193 lb 12.8 oz (87.9 kg)  06/15/22 190 lb (86.2 kg)  03/23/22 190 lb (86.2 kg)    Physical Exam Vitals reviewed.  Constitutional:      General: She is not in acute distress.    Appearance: Normal appearance. She is normal weight. She is not ill-appearing, toxic-appearing or diaphoretic.  HENT:     Head: Normocephalic.  Eyes:     General: No scleral icterus.       Right eye: No discharge.        Left eye: No discharge.     Conjunctiva/sclera: Conjunctivae normal.  Cardiovascular:     Rate and Rhythm: Normal rate and regular rhythm.     Heart sounds: Normal heart sounds.  Pulmonary:     Effort: Pulmonary effort is normal. No respiratory distress.     Breath sounds: Normal breath sounds.  Musculoskeletal:        General: Normal range of motion.  Skin:    General: Skin is warm and dry.  Neurological:     General: No focal deficit present.     Mental Status: She is alert and oriented to person, place, and time. Mental status is at baseline.  Psychiatric:        Mood and Affect: Mood normal.        Behavior: Behavior normal.        Thought Content: Thought content normal.        Judgment: Judgment normal.    Lab Results  Component Value Date   HGBA1C 5.6 07/08/2022   HGBA1C 6.1 01/06/2022   HGBA1C 5.2 07/06/2021    Lab Results  Component Value Date   CREATININE 0.72 07/08/2022   CREATININE 0.76 06/15/2022   CREATININE 0.62 01/06/2022    Lab Results  Component Value Date   WBC 6.3 06/15/2022   HGB 13.8  06/15/2022   HCT 39.7 06/15/2022   PLT 197 06/15/2022   GLUCOSE 93 07/08/2022   CHOL 119 07/08/2022   TRIG 76.0 07/08/2022   HDL 64.80 07/08/2022   LDLDIRECT 50.0 07/08/2022   LDLCALC 39 07/08/2022   ALT 15 07/08/2022   AST 18 07/08/2022   NA 137 07/08/2022   K 4.2 07/08/2022   CL 104 07/08/2022   CREATININE 0.72 07/08/2022   BUN 17 07/08/2022  CO2 23 07/08/2022   TSH 1.80 01/06/2022   INR 1.0 11/24/2017   HGBA1C 5.6 07/08/2022   MICROALBUR <0.7 01/06/2022    CT HEAD WO CONTRAST ( )  Result Date: 06/15/2022 CLINICAL DATA:  Head and neck trauma EXAM: CT HEAD WITHOUT CONTRAST CT MAXILLOFACIAL WITHOUT CONTRAST CT CERVICAL SPINE WITHOUT CONTRAST TECHNIQUE: Multidetector CT imaging of the head, cervical spine, and maxillofacial structures were performed using the standard protocol without intravenous contrast. Multiplanar CT image reconstructions of the cervical spine and maxillofacial structures were also generated. RADIATION DOSE REDUCTION: This exam was performed according to the departmental dose-optimization program which includes automated exposure control, adjustment of the mA and/or kV according to patient size and/or use of iterative reconstruction technique. COMPARISON:  None Available. FINDINGS: CT HEAD FINDINGS Brain: No evidence of acute infarction, hemorrhage, hydrocephalus, extra-axial collection or mass lesion/mass effect. Mild periventricular white matter hypodensity. Vascular: No hyperdense vessel or unexpected calcification. CT FACIAL BONES FINDINGS Skull: Normal. Negative for fracture or focal lesion. Facial bones: No displaced fractures or dislocations. Sinuses/Orbits: No acute finding. Other: None. CT CERVICAL SPINE FINDINGS Alignment: Degenerative straightening and reversal of the normal cervical lordosis. Skull base and vertebrae: No acute fracture. No primary bone lesion or focal pathologic process. Soft tissues and spinal canal: No prevertebral fluid or swelling. No  visible canal hematoma. Disc levels: Generally moderate disc space height loss and osteophytosis throughout the cervical spine, focally severe at C5-C6. Upper chest: Negative. Other: None. IMPRESSION: 1. No acute intracranial pathology. Small-vessel white matter disease. 2. No displaced fractures or dislocations of the facial bones. 3. No fracture or static subluxation of the cervical spine. 4. Generally moderate disc space height loss and osteophytosis throughout the cervical spine, focally severe at C5-C6. Electronically Signed   By: Jearld Lesch M.D.   On: 06/15/2022 12:37   CT Cervical Spine Wo Contrast  Result Date: 06/15/2022 CLINICAL DATA:  Head and neck trauma EXAM: CT HEAD WITHOUT CONTRAST CT MAXILLOFACIAL WITHOUT CONTRAST CT CERVICAL SPINE WITHOUT CONTRAST TECHNIQUE: Multidetector CT imaging of the head, cervical spine, and maxillofacial structures were performed using the standard protocol without intravenous contrast. Multiplanar CT image reconstructions of the cervical spine and maxillofacial structures were also generated. RADIATION DOSE REDUCTION: This exam was performed according to the departmental dose-optimization program which includes automated exposure control, adjustment of the mA and/or kV according to patient size and/or use of iterative reconstruction technique. COMPARISON:  None Available. FINDINGS: CT HEAD FINDINGS Brain: No evidence of acute infarction, hemorrhage, hydrocephalus, extra-axial collection or mass lesion/mass effect. Mild periventricular white matter hypodensity. Vascular: No hyperdense vessel or unexpected calcification. CT FACIAL BONES FINDINGS Skull: Normal. Negative for fracture or focal lesion. Facial bones: No displaced fractures or dislocations. Sinuses/Orbits: No acute finding. Other: None. CT CERVICAL SPINE FINDINGS Alignment: Degenerative straightening and reversal of the normal cervical lordosis. Skull base and vertebrae: No acute fracture. No primary bone  lesion or focal pathologic process. Soft tissues and spinal canal: No prevertebral fluid or swelling. No visible canal hematoma. Disc levels: Generally moderate disc space height loss and osteophytosis throughout the cervical spine, focally severe at C5-C6. Upper chest: Negative. Other: None. IMPRESSION: 1. No acute intracranial pathology. Small-vessel white matter disease. 2. No displaced fractures or dislocations of the facial bones. 3. No fracture or static subluxation of the cervical spine. 4. Generally moderate disc space height loss and osteophytosis throughout the cervical spine, focally severe at C5-C6. Electronically Signed   By: Jearld Lesch M.D.   On: 06/15/2022 12:37  CT Maxillofacial Wo Contrast  Result Date: 06/15/2022 CLINICAL DATA:  Head and neck trauma EXAM: CT HEAD WITHOUT CONTRAST CT MAXILLOFACIAL WITHOUT CONTRAST CT CERVICAL SPINE WITHOUT CONTRAST TECHNIQUE: Multidetector CT imaging of the head, cervical spine, and maxillofacial structures were performed using the standard protocol without intravenous contrast. Multiplanar CT image reconstructions of the cervical spine and maxillofacial structures were also generated. RADIATION DOSE REDUCTION: This exam was performed according to the departmental dose-optimization program which includes automated exposure control, adjustment of the mA and/or kV according to patient size and/or use of iterative reconstruction technique. COMPARISON:  None Available. FINDINGS: CT HEAD FINDINGS Brain: No evidence of acute infarction, hemorrhage, hydrocephalus, extra-axial collection or mass lesion/mass effect. Mild periventricular white matter hypodensity. Vascular: No hyperdense vessel or unexpected calcification. CT FACIAL BONES FINDINGS Skull: Normal. Negative for fracture or focal lesion. Facial bones: No displaced fractures or dislocations. Sinuses/Orbits: No acute finding. Other: None. CT CERVICAL SPINE FINDINGS Alignment: Degenerative straightening and  reversal of the normal cervical lordosis. Skull base and vertebrae: No acute fracture. No primary bone lesion or focal pathologic process. Soft tissues and spinal canal: No prevertebral fluid or swelling. No visible canal hematoma. Disc levels: Generally moderate disc space height loss and osteophytosis throughout the cervical spine, focally severe at C5-C6. Upper chest: Negative. Other: None. IMPRESSION: 1. No acute intracranial pathology. Small-vessel white matter disease. 2. No displaced fractures or dislocations of the facial bones. 3. No fracture or static subluxation of the cervical spine. 4. Generally moderate disc space height loss and osteophytosis throughout the cervical spine, focally severe at C5-C6. Electronically Signed   By: Jearld Lesch M.D.   On: 06/15/2022 12:37    Assessment & Plan:  .Concussion with loss of consciousness of 30 minutes or less, sequela Assessment & Plan: Occurred 3 weeks ago during a walk in the woods with her dog.  She was evaluated in the ER several days later when she developed nausea and vomiting and a persistent headache.  CT head, spine and maxillfacial views done in ER. No fractures seen.  Headache is still present but improving . Advised to use tylenol at max doses and avoid regular use of NSAIDs given h/o NSAID induced gastritis in 2020.  Concussion care reviewed.    Essential hypertension -     Comprehensive metabolic panel  Prediabetes -     Hemoglobin A1c -     Comprehensive metabolic panel  Hyperlipidemia, unspecified hyperlipidemia type -     Lipid panel -     LDL cholesterol, direct  Colon cancer screening -     Ambulatory referral to Gastroenterology  Excessive cerumen in right ear canal  Diuretic-induced hypokalemia Assessment & Plan: Resolved,  But currently taking 20 meq potassium AND spironolactone  Potassium is stable. No changes today per patient preference  Lab Results  Component Value Date   NA 131 (L) 06/15/2022   K 3.8  06/15/2022   CL 93 (L) 06/15/2022   CO2 24 06/15/2022         I provided 41 minutes of face-to-face time during this encounter reviewing patient's ER visit,  her last visit with me, patient's  most recent visit with cardiology,  recent surgical and non surgical procedures, previous  labs and imaging studies, counseling on currently addressed issues,  and post visit ordering to diagnostics and therapeutics .   Follow-up: No follow-ups on file.   Sherlene Shams, MD

## 2022-07-08 NOTE — Assessment & Plan Note (Signed)
Occurred 3 weeks ago during a walk in the woods with her dog.  She was evaluated in the ER several days later when she developed nausea and vomiting and a persistent headache.  CT head, spine and maxillfacial views done in ER. No fractures seen.  Headache is still present but improving . Advised to use tylenol at max doses and avoid regular use of NSAIDs given h/o NSAID induced gastritis in 2020.  Concussion care reviewed.

## 2022-07-08 NOTE — Assessment & Plan Note (Signed)
Resolved,  But currently taking 20 meq potassium AND spironolactone  Potassium is stable. No changes today per patient preference  Lab Results  Component Value Date   NA 131 (L) 06/15/2022   K 3.8 06/15/2022   CL 93 (L) 06/15/2022   CO2 24 06/15/2022

## 2022-07-08 NOTE — Patient Instructions (Addendum)
For your headache,   Max out  tylenol at 1000 mg every 8 to 12 hours , then add ibuprofen if needed  No alcohol  or direct sunlight   Right ear has a was buildup.  Try using Debrox  or liquid colace a few drops at night before bed to soften it up

## 2022-07-23 DIAGNOSIS — R002 Palpitations: Secondary | ICD-10-CM | POA: Diagnosis not present

## 2022-07-23 DIAGNOSIS — G4733 Obstructive sleep apnea (adult) (pediatric): Secondary | ICD-10-CM | POA: Diagnosis not present

## 2022-07-23 DIAGNOSIS — I10 Essential (primary) hypertension: Secondary | ICD-10-CM | POA: Diagnosis not present

## 2022-07-23 DIAGNOSIS — E669 Obesity, unspecified: Secondary | ICD-10-CM | POA: Diagnosis not present

## 2022-07-23 DIAGNOSIS — I480 Paroxysmal atrial fibrillation: Secondary | ICD-10-CM | POA: Diagnosis not present

## 2022-07-23 DIAGNOSIS — I491 Atrial premature depolarization: Secondary | ICD-10-CM | POA: Diagnosis not present

## 2022-07-23 DIAGNOSIS — Z881 Allergy status to other antibiotic agents status: Secondary | ICD-10-CM | POA: Diagnosis not present

## 2022-07-23 DIAGNOSIS — Z7901 Long term (current) use of anticoagulants: Secondary | ICD-10-CM | POA: Diagnosis not present

## 2022-07-23 DIAGNOSIS — Z880 Allergy status to penicillin: Secondary | ICD-10-CM | POA: Diagnosis not present

## 2022-07-23 DIAGNOSIS — I471 Supraventricular tachycardia, unspecified: Secondary | ICD-10-CM | POA: Diagnosis not present

## 2022-07-24 DIAGNOSIS — I480 Paroxysmal atrial fibrillation: Secondary | ICD-10-CM | POA: Diagnosis not present

## 2022-08-09 ENCOUNTER — Encounter: Payer: Self-pay | Admitting: Internal Medicine

## 2022-08-10 MED ORDER — BUPROPION HCL ER (SR) 200 MG PO TB12: 200.0000 mg | ORAL_TABLET | Freq: Every day | ORAL | 1 refills | Status: DC

## 2022-08-10 NOTE — Telephone Encounter (Signed)
Is it okay to send rx in with corrected directions of 1 tablet by mouth daily. Rx in chart says to 2 tablets daily.

## 2022-08-29 ENCOUNTER — Other Ambulatory Visit: Payer: Self-pay | Admitting: Internal Medicine

## 2022-09-23 ENCOUNTER — Other Ambulatory Visit: Payer: Self-pay | Admitting: Internal Medicine

## 2022-09-24 DIAGNOSIS — I491 Atrial premature depolarization: Secondary | ICD-10-CM | POA: Diagnosis not present

## 2022-09-24 DIAGNOSIS — G4730 Sleep apnea, unspecified: Secondary | ICD-10-CM | POA: Diagnosis not present

## 2022-09-24 DIAGNOSIS — I480 Paroxysmal atrial fibrillation: Secondary | ICD-10-CM | POA: Diagnosis not present

## 2022-09-24 DIAGNOSIS — G4733 Obstructive sleep apnea (adult) (pediatric): Secondary | ICD-10-CM | POA: Diagnosis not present

## 2022-09-24 DIAGNOSIS — I10 Essential (primary) hypertension: Secondary | ICD-10-CM | POA: Diagnosis not present

## 2022-09-24 NOTE — Telephone Encounter (Signed)
Refilled: 03/09/2022 Last OV: 07/08/2022 Next OV: 01/17/2023 Last Potassium: 4.2 on 07/08/2022

## 2022-10-01 IMAGING — US US PELVIS COMPLETE WITH TRANSVAGINAL
1 series · 14 of 25 positions shown · non-contrast
Comparison: None

CLINICAL DATA: Pelvic bloating for 1 year, question ovarian cancer;
history of breast cancer; postmenopausal



[Series 1: us pelvic complete with transvaginal · 14 of 71 slices shown]
[im 1/71]
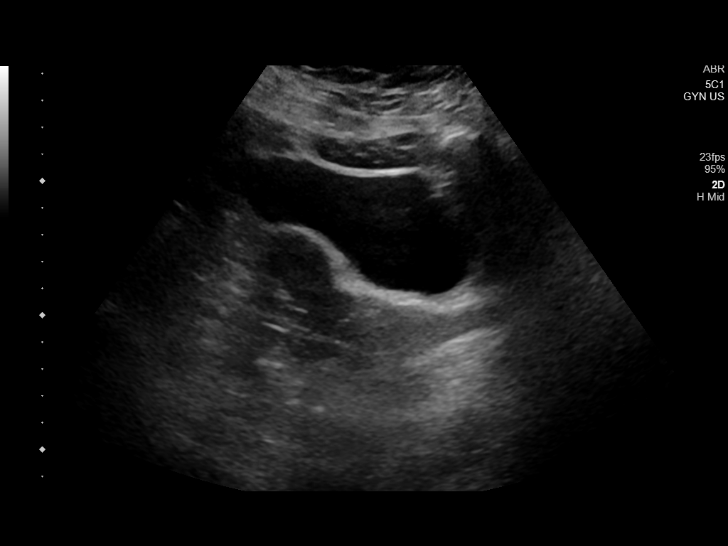
[im 6/71]
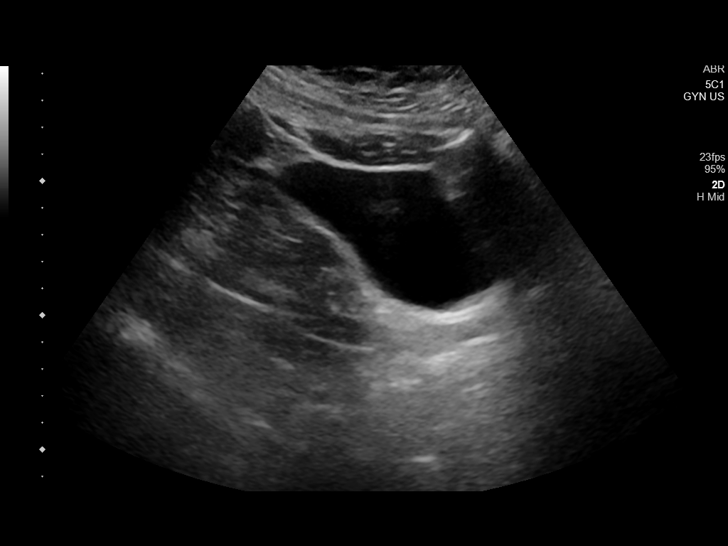
[im 12/71]
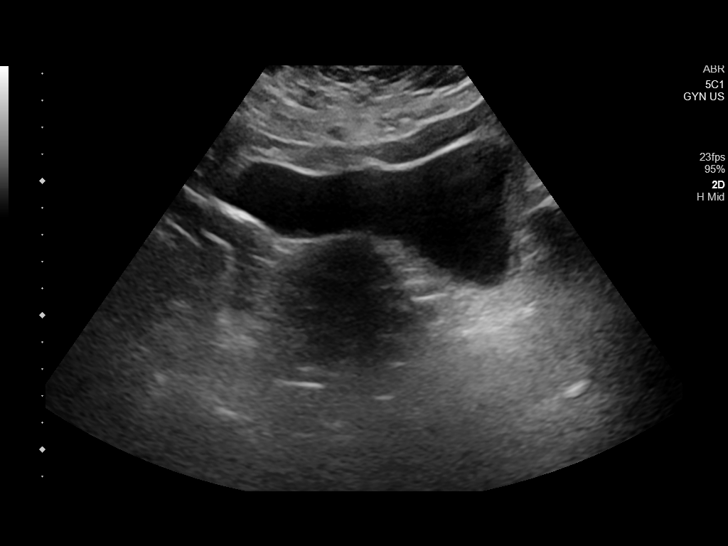
[im 18/71]
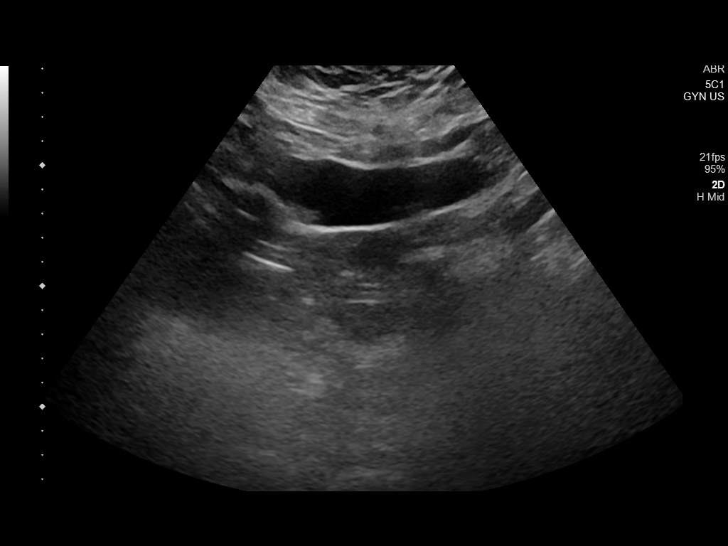
[im 24/71]
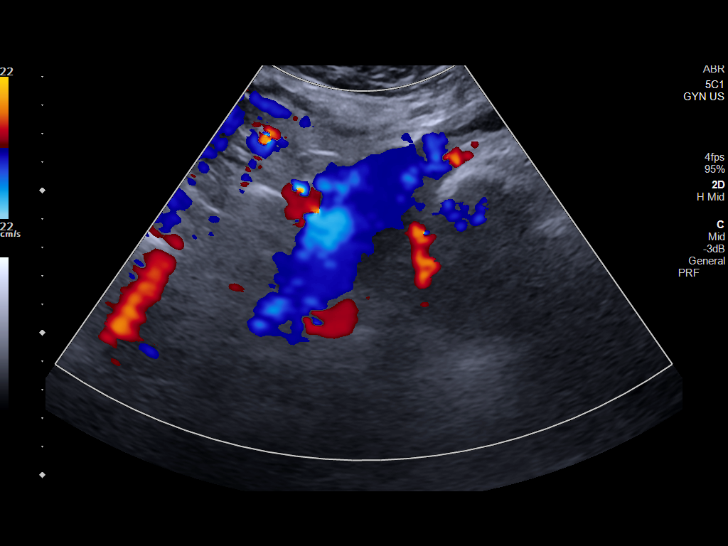
[im 27/71]
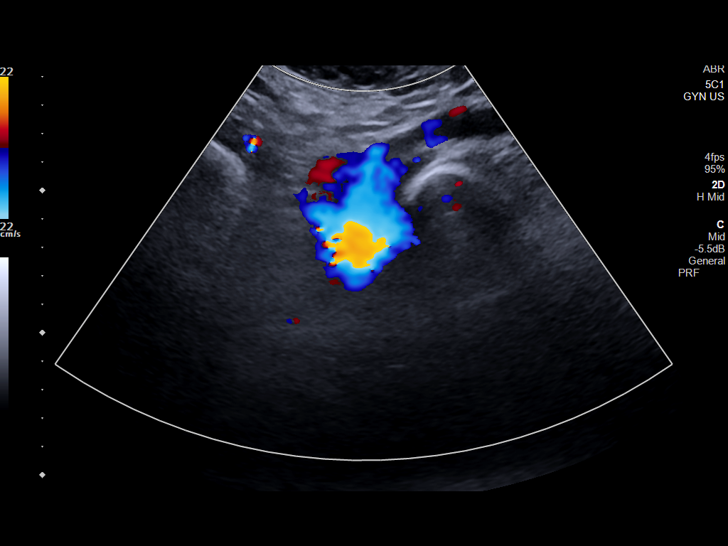
[im 33/71]
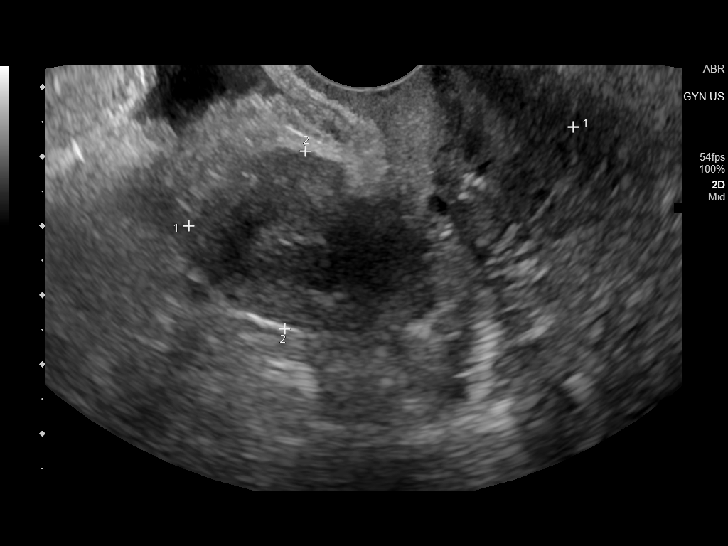
[im 38/71]
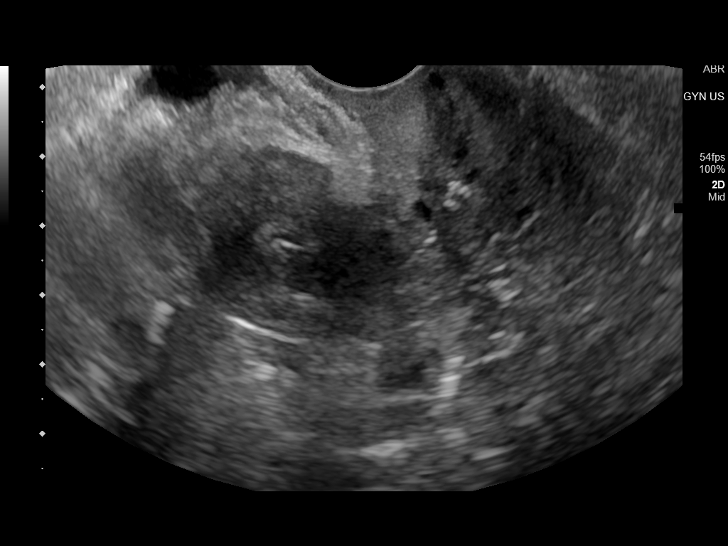
[im 44/71]
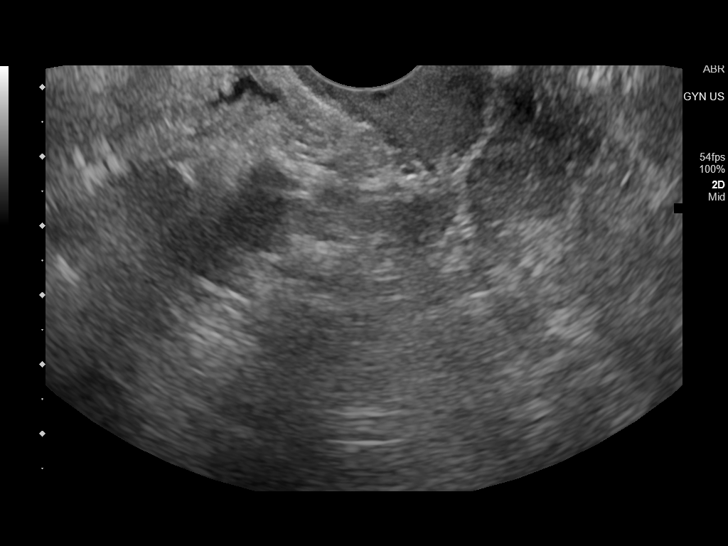
[im 47/71]
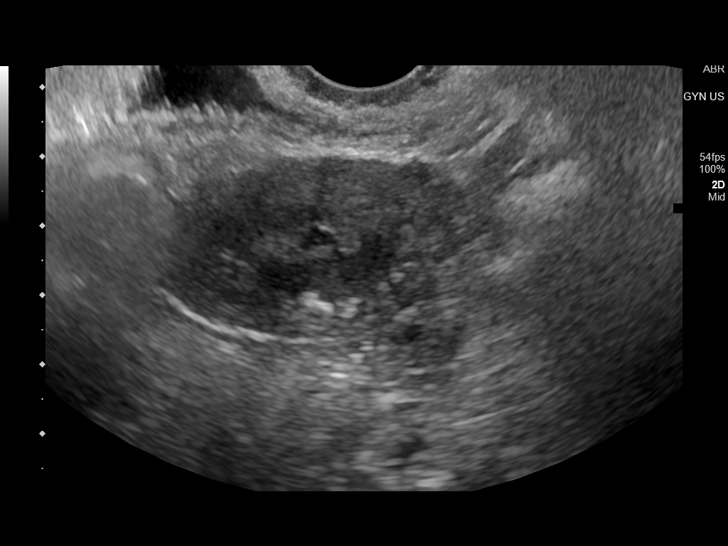
[im 53/71]
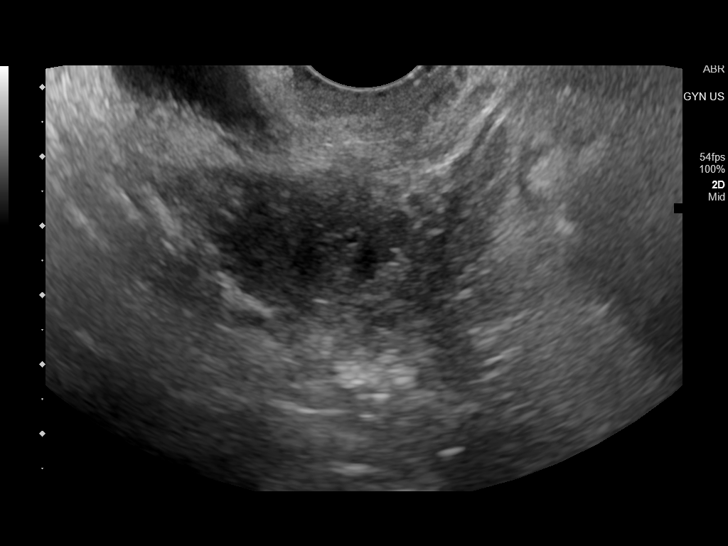
[im 59/71]
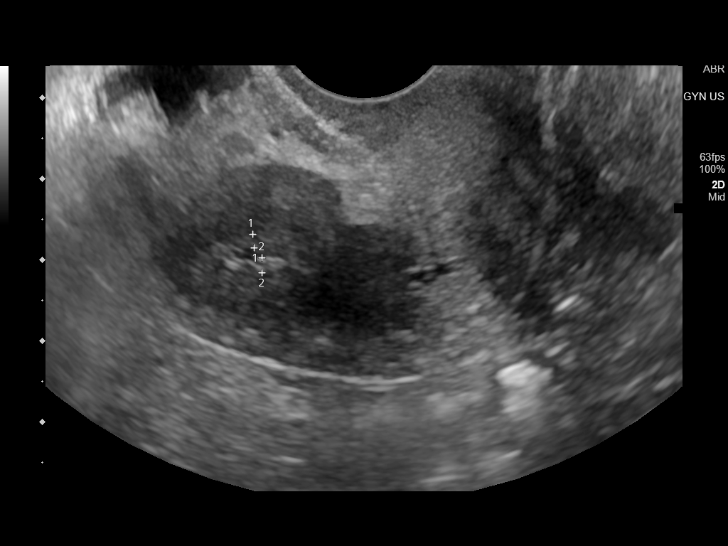
[im 65/71]
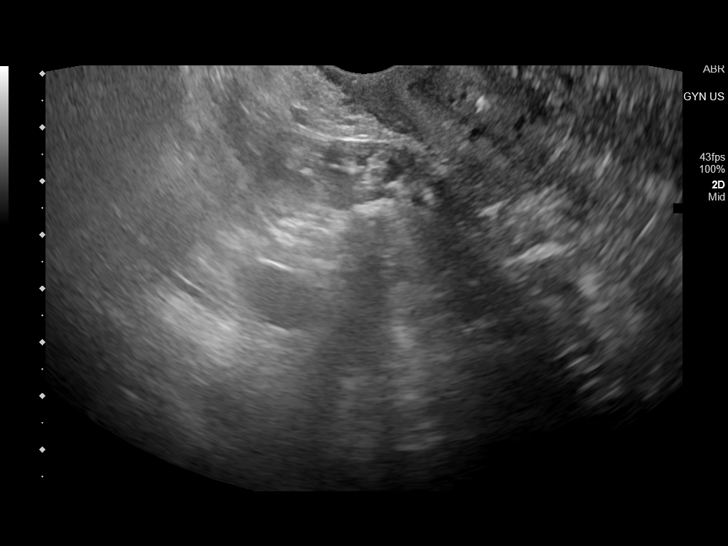
[im 71/71]
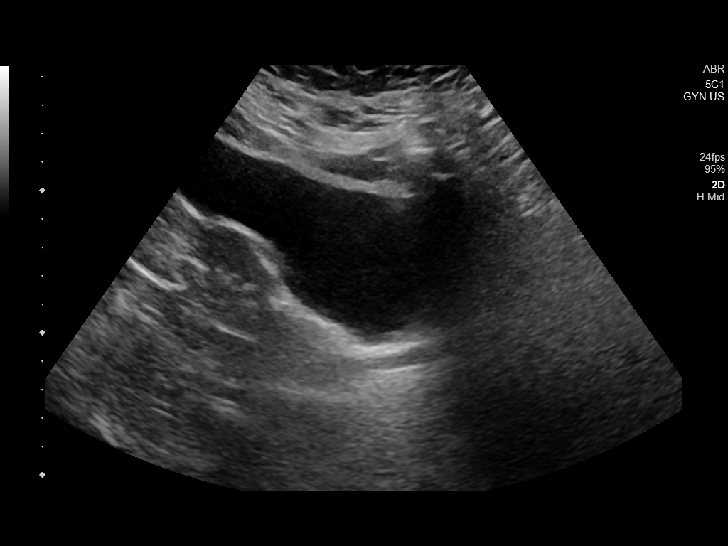

[14 of 25 positions shown; findings below may reference images not displayed]

FINDINGS: Uterus

Measurements: 7.2 x 2.2 x 5.0 cm = volume: 42 mL. Anteverted.
Heterogeneous myometrial echogenicity. No discrete mass

Endometrium

Thickness: 4 mm. Small amount of nonspecific endometrial fluid. No
discrete mass.

Right ovary

Not visualized, question atrophic versus obscured by bowel

Left ovary

Not visualized, question atrophic versus obscured by bowel

Other findings

No free pelvic fluid.  No adnexal masses.
IMPRESSION: Nonvisualization of ovaries.

Small amount of nonspecific endometrial fluid.

Otherwise negative exam.

## 2022-10-18 IMAGING — US US ABDOMEN LIMITED
1 series · 14 of 25 positions shown · non-contrast
Comparison: By ultrasound on 02/22/2013 and MRI of the abdomen on
03/05/2013

CLINICAL DATA: Fatty liver.

EXAM:
ULTRASOUND ABDOMEN LIMITED RIGHT UPPER QUADRANT

[Series 1: us abdomen limited · 0.22mm/px · 14 of 59 slices shown]
[im 1/59]
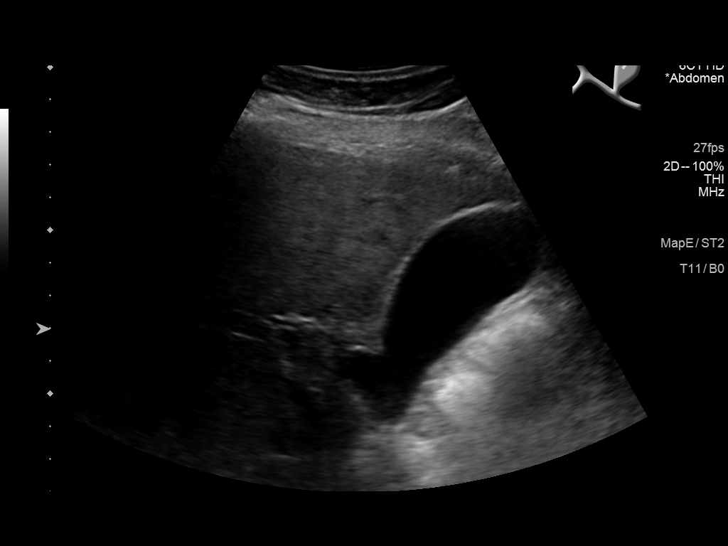
[im 5/59]
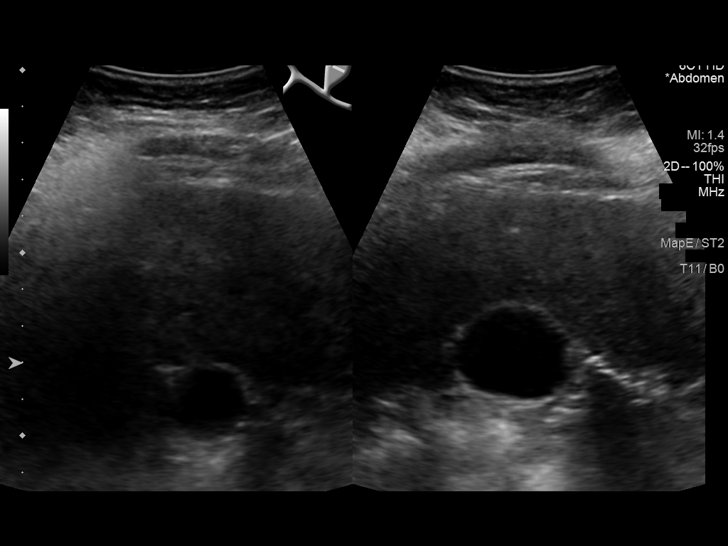
[im 10/59]
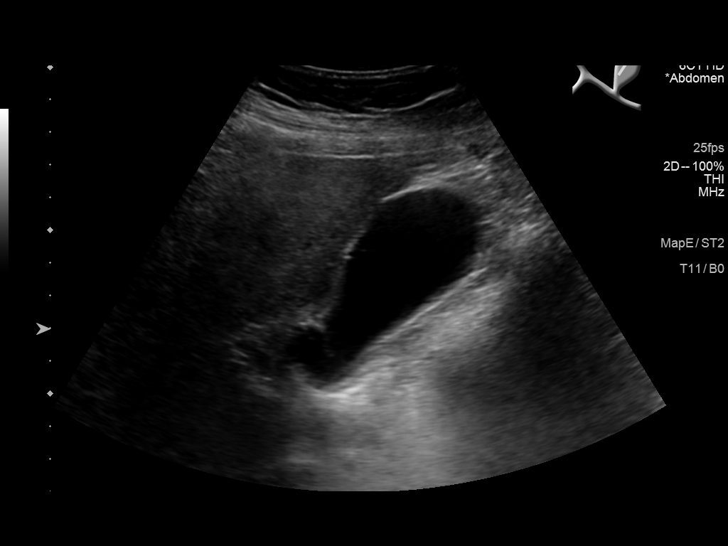
[im 15/59]
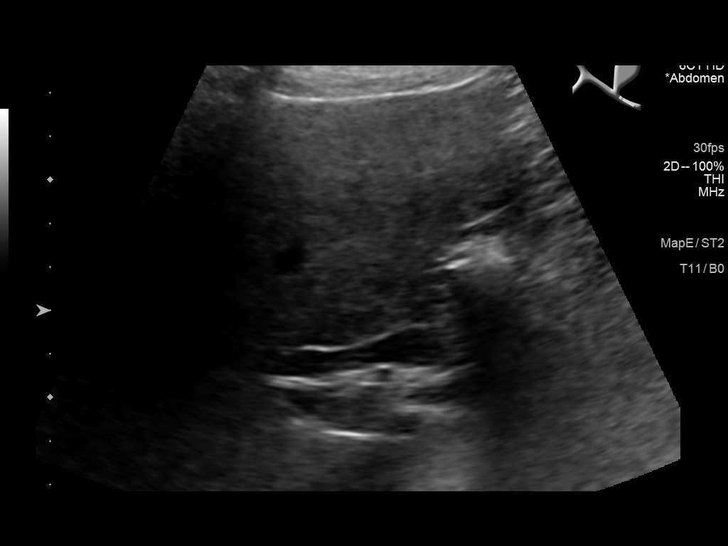
[im 20/59]
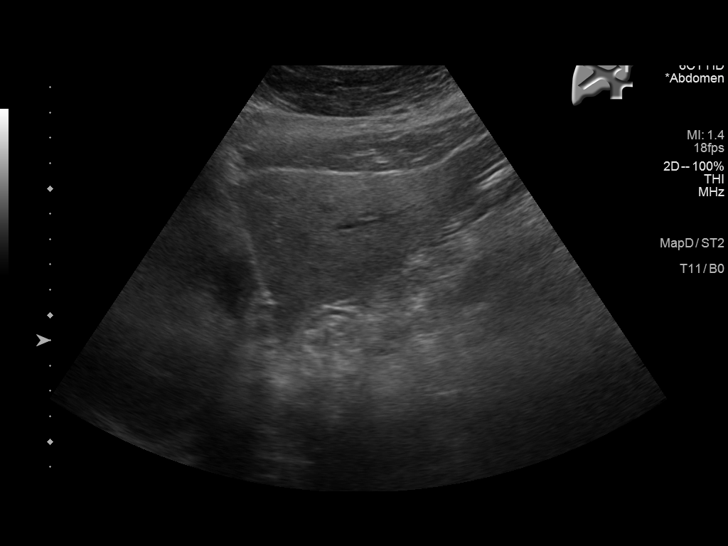
[im 22/59]
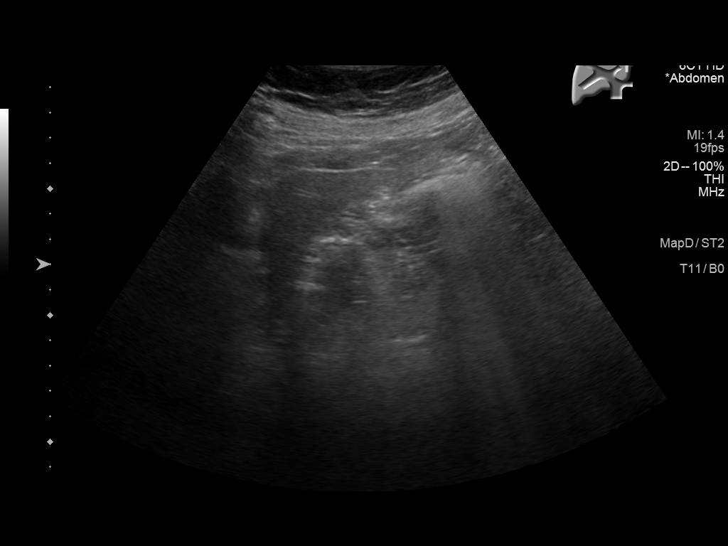
[im 27/59]
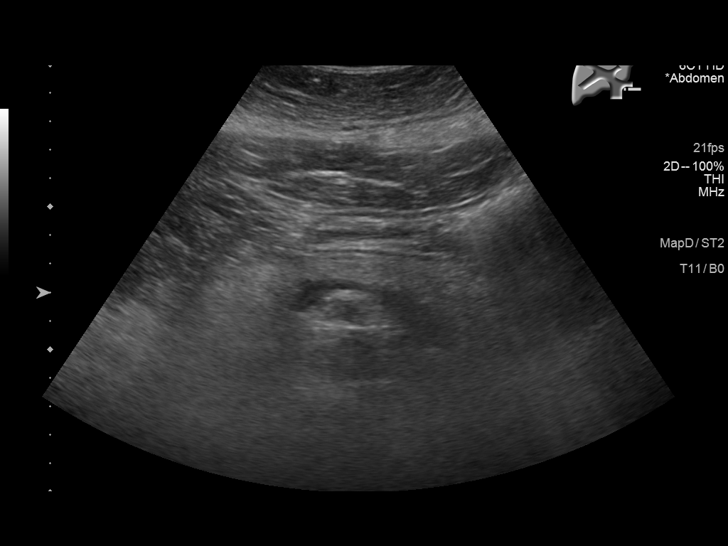
[im 32/59]
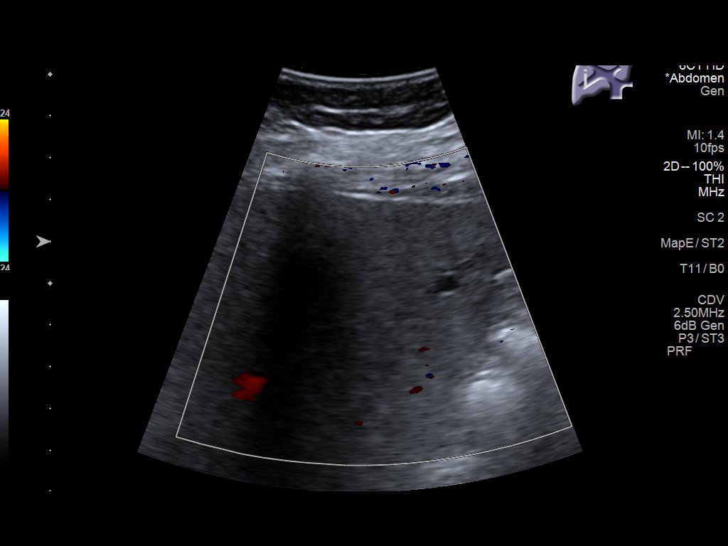
[im 37/59]
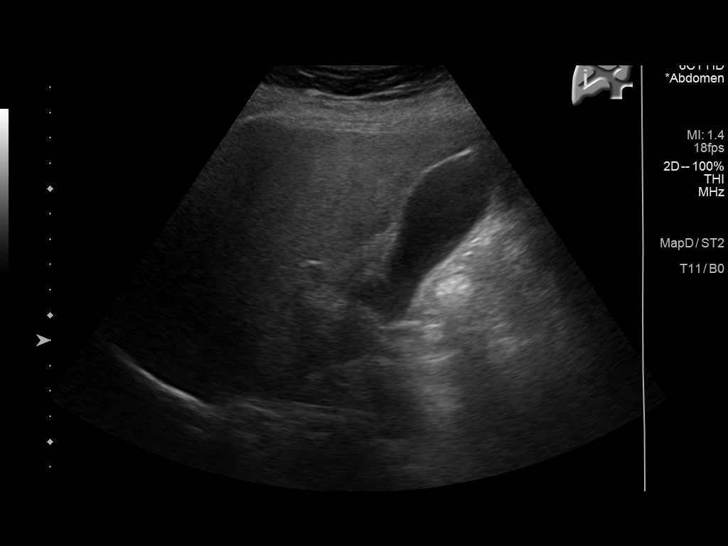
[im 39/59]
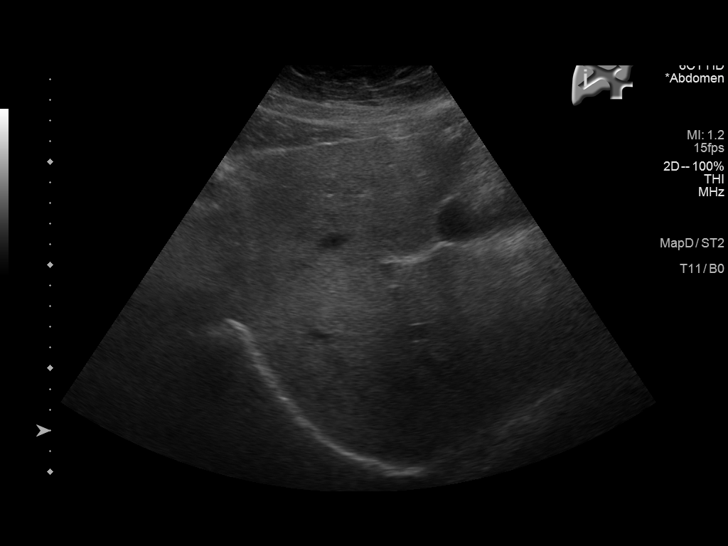
[im 44/59]
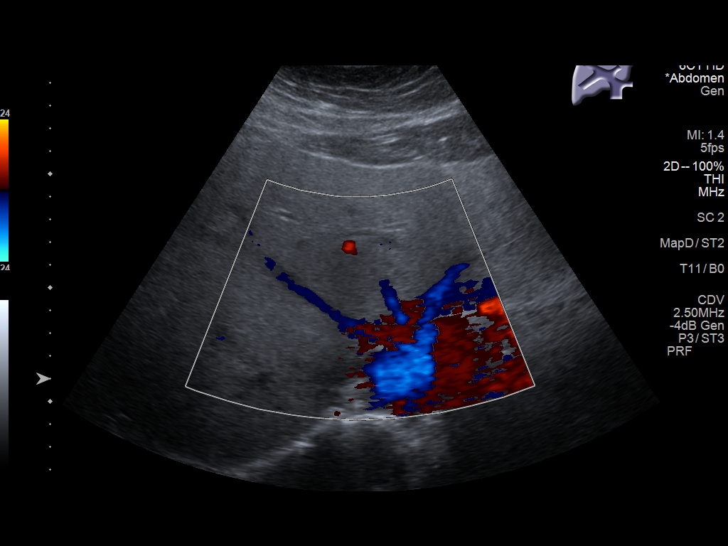
[im 49/59]
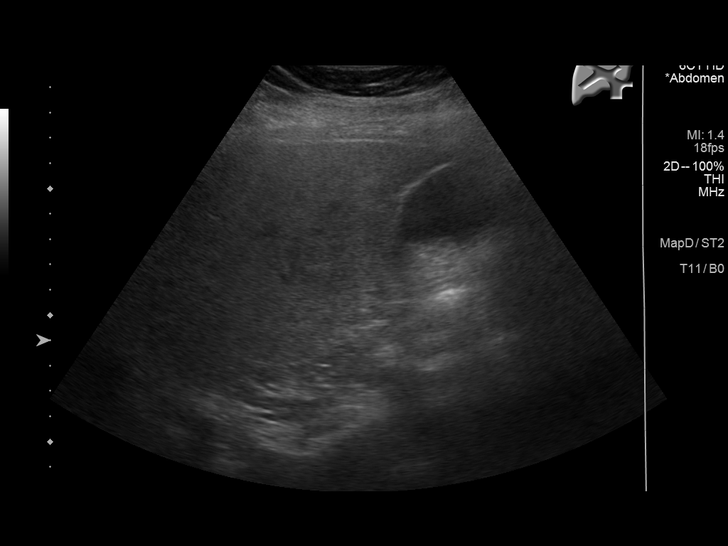
[im 54/59]
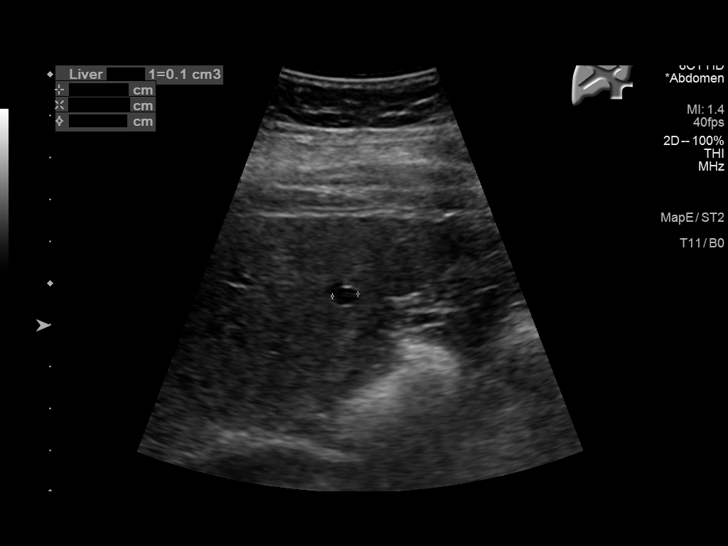
[im 59/59]
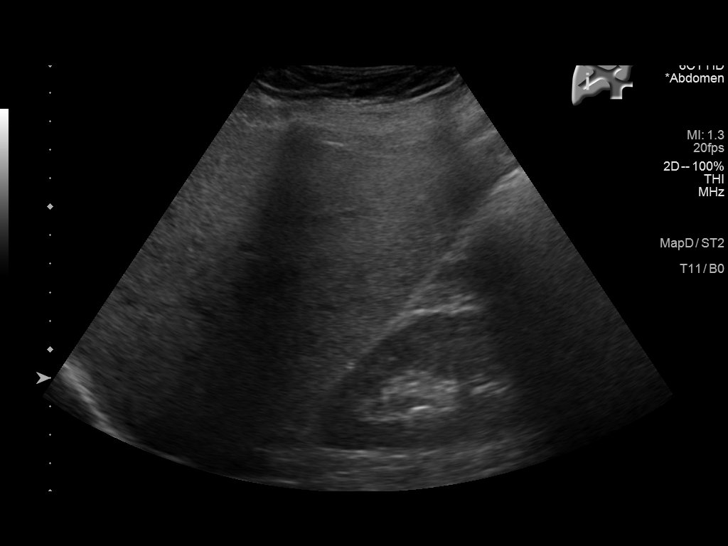

[14 of 25 positions shown; findings below may reference images not displayed]

FINDINGS: Gallbladder:

No gallstones or wall thickening visualized. No sonographic Murphy
sign noted by sonographer.

Common bile duct:

Diameter: 6 mm

Liver:

The liver demonstrates coarse echotexture and increased
echogenicity, likely reflecting diffuse steatosis. No overt
cirrhotic contour abnormalities or focal lesions are identified.
There is no evidence of intrahepatic biliary ductal dilatation.
Portal vein is patent on color Doppler imaging with normal direction
of blood flow towards the liver.

Other: No ascites identified.
IMPRESSION: Evidence of hepatic steatosis by ultrasound without overt cirrhosis.
No focal hepatic lesions identified.

## 2022-10-22 DIAGNOSIS — L570 Actinic keratosis: Secondary | ICD-10-CM | POA: Diagnosis not present

## 2022-11-11 ENCOUNTER — Other Ambulatory Visit: Payer: Self-pay | Admitting: Internal Medicine

## 2022-11-17 DIAGNOSIS — L578 Other skin changes due to chronic exposure to nonionizing radiation: Secondary | ICD-10-CM | POA: Diagnosis not present

## 2022-11-17 DIAGNOSIS — Z859 Personal history of malignant neoplasm, unspecified: Secondary | ICD-10-CM | POA: Diagnosis not present

## 2022-11-17 DIAGNOSIS — L570 Actinic keratosis: Secondary | ICD-10-CM | POA: Diagnosis not present

## 2022-11-17 DIAGNOSIS — L718 Other rosacea: Secondary | ICD-10-CM | POA: Diagnosis not present

## 2022-11-17 DIAGNOSIS — Z86018 Personal history of other benign neoplasm: Secondary | ICD-10-CM | POA: Diagnosis not present

## 2022-11-17 DIAGNOSIS — Z85828 Personal history of other malignant neoplasm of skin: Secondary | ICD-10-CM | POA: Diagnosis not present

## 2022-11-17 DIAGNOSIS — Z85820 Personal history of malignant melanoma of skin: Secondary | ICD-10-CM | POA: Diagnosis not present

## 2022-11-17 DIAGNOSIS — Z872 Personal history of diseases of the skin and subcutaneous tissue: Secondary | ICD-10-CM | POA: Diagnosis not present

## 2022-11-26 DIAGNOSIS — K5730 Diverticulosis of large intestine without perforation or abscess without bleeding: Secondary | ICD-10-CM | POA: Diagnosis not present

## 2022-11-26 DIAGNOSIS — K644 Residual hemorrhoidal skin tags: Secondary | ICD-10-CM | POA: Diagnosis not present

## 2022-11-26 DIAGNOSIS — I10 Essential (primary) hypertension: Secondary | ICD-10-CM | POA: Diagnosis not present

## 2022-11-26 DIAGNOSIS — D124 Benign neoplasm of descending colon: Secondary | ICD-10-CM | POA: Diagnosis not present

## 2022-11-26 DIAGNOSIS — K635 Polyp of colon: Secondary | ICD-10-CM | POA: Diagnosis not present

## 2022-11-26 DIAGNOSIS — Z1211 Encounter for screening for malignant neoplasm of colon: Secondary | ICD-10-CM | POA: Diagnosis not present

## 2022-11-26 DIAGNOSIS — D123 Benign neoplasm of transverse colon: Secondary | ICD-10-CM | POA: Diagnosis not present

## 2022-11-29 DIAGNOSIS — I83813 Varicose veins of bilateral lower extremities with pain: Secondary | ICD-10-CM | POA: Diagnosis not present

## 2022-12-02 DIAGNOSIS — H6121 Impacted cerumen, right ear: Secondary | ICD-10-CM | POA: Diagnosis not present

## 2022-12-02 DIAGNOSIS — H900 Conductive hearing loss, bilateral: Secondary | ICD-10-CM | POA: Diagnosis not present

## 2022-12-06 ENCOUNTER — Other Ambulatory Visit: Payer: Self-pay | Admitting: Internal Medicine

## 2022-12-14 DIAGNOSIS — I872 Venous insufficiency (chronic) (peripheral): Secondary | ICD-10-CM | POA: Diagnosis not present

## 2022-12-14 DIAGNOSIS — Z7901 Long term (current) use of anticoagulants: Secondary | ICD-10-CM | POA: Diagnosis not present

## 2022-12-14 DIAGNOSIS — Z803 Family history of malignant neoplasm of breast: Secondary | ICD-10-CM | POA: Diagnosis not present

## 2022-12-14 DIAGNOSIS — I83813 Varicose veins of bilateral lower extremities with pain: Secondary | ICD-10-CM | POA: Diagnosis not present

## 2022-12-14 DIAGNOSIS — I83812 Varicose veins of left lower extremities with pain: Secondary | ICD-10-CM | POA: Diagnosis not present

## 2022-12-20 DIAGNOSIS — I83813 Varicose veins of bilateral lower extremities with pain: Secondary | ICD-10-CM | POA: Diagnosis not present

## 2022-12-28 ENCOUNTER — Other Ambulatory Visit: Payer: Self-pay | Admitting: Internal Medicine

## 2023-01-12 DIAGNOSIS — H04123 Dry eye syndrome of bilateral lacrimal glands: Secondary | ICD-10-CM | POA: Diagnosis not present

## 2023-01-12 DIAGNOSIS — H25813 Combined forms of age-related cataract, bilateral: Secondary | ICD-10-CM | POA: Diagnosis not present

## 2023-01-17 ENCOUNTER — Ambulatory Visit: Payer: Medicare PPO | Admitting: Internal Medicine

## 2023-01-17 ENCOUNTER — Encounter: Payer: Self-pay | Admitting: Internal Medicine

## 2023-01-17 ENCOUNTER — Ambulatory Visit: Payer: Medicare PPO

## 2023-01-17 VITALS — BP 130/72 | HR 94 | Ht 67.0 in | Wt 192.6 lb

## 2023-01-17 DIAGNOSIS — R7303 Prediabetes: Secondary | ICD-10-CM | POA: Diagnosis not present

## 2023-01-17 DIAGNOSIS — Z87820 Personal history of traumatic brain injury: Secondary | ICD-10-CM

## 2023-01-17 DIAGNOSIS — I480 Paroxysmal atrial fibrillation: Secondary | ICD-10-CM | POA: Diagnosis not present

## 2023-01-17 DIAGNOSIS — I10 Essential (primary) hypertension: Secondary | ICD-10-CM

## 2023-01-17 DIAGNOSIS — J479 Bronchiectasis, uncomplicated: Secondary | ICD-10-CM

## 2023-01-17 DIAGNOSIS — R053 Chronic cough: Secondary | ICD-10-CM | POA: Diagnosis not present

## 2023-01-17 DIAGNOSIS — S060X1S Concussion with loss of consciousness of 30 minutes or less, sequela: Secondary | ICD-10-CM

## 2023-01-17 DIAGNOSIS — R6889 Other general symptoms and signs: Secondary | ICD-10-CM | POA: Diagnosis not present

## 2023-01-17 DIAGNOSIS — Z853 Personal history of malignant neoplasm of breast: Secondary | ICD-10-CM | POA: Diagnosis not present

## 2023-01-17 DIAGNOSIS — E785 Hyperlipidemia, unspecified: Secondary | ICD-10-CM

## 2023-01-17 DIAGNOSIS — H60543 Acute eczematoid otitis externa, bilateral: Secondary | ICD-10-CM | POA: Insufficient documentation

## 2023-01-17 LAB — LIPID PANEL
Cholesterol: 135 mg/dL (ref 0–200)
HDL: 69.8 mg/dL (ref >39.00)
LDL Cholesterol: 38 mg/dL (ref 0–99)
NonHDL: 65.36
Total CHOL/HDL Ratio: 2
Triglycerides: 138 mg/dL (ref 0.0–149.0)
VLDL: 27.6 mg/dL (ref 0.0–40.0)

## 2023-01-17 LAB — COMPREHENSIVE METABOLIC PANEL
ALT: 22 U/L (ref 0–35)
AST: 25 U/L (ref 0–37)
Albumin: 4.5 g/dL (ref 3.5–5.2)
Alkaline Phosphatase: 60 U/L (ref 39–117)
BUN: 16 mg/dL (ref 6–23)
CO2: 25 meq/L (ref 19–32)
Calcium: 9.8 mg/dL (ref 8.4–10.5)
Chloride: 102 meq/L (ref 96–112)
Creatinine, Ser: 0.75 mg/dL (ref 0.40–1.20)
GFR: 77.96 mL/min (ref >60.00)
Glucose, Bld: 87 mg/dL (ref 70–99)
Potassium: 4.1 meq/L (ref 3.5–5.1)
Sodium: 136 meq/L (ref 135–145)
Total Bilirubin: 0.6 mg/dL (ref 0.2–1.2)
Total Protein: 7.5 g/dL (ref 6.0–8.3)

## 2023-01-17 LAB — LDL CHOLESTEROL, DIRECT: Direct LDL: 54 mg/dL

## 2023-01-17 LAB — MICROALBUMIN / CREATININE URINE RATIO
Creatinine,U: 59.5 mg/dL
Microalb Creat Ratio: 1.2 mg/g (ref 0.0–30.0)
Microalb, Ur: <0.7 mg/dL (ref 0.0–1.9)

## 2023-01-17 LAB — C-REACTIVE PROTEIN: CRP: <1 mg/dL (ref 0.5–20.0)

## 2023-01-17 LAB — SEDIMENTATION RATE: Sed Rate: 22 mm/h (ref 0–30)

## 2023-01-17 LAB — HEMOGLOBIN A1C: Hgb A1c MFr Bld: 5.6 % (ref 4.6–6.5)

## 2023-01-17 MED ORDER — MOMETASONE FUROATE 0.1 % EX CREA: TOPICAL_CREAM | CUTANEOUS | 1 refills | Status: AC

## 2023-01-17 NOTE — Assessment & Plan Note (Signed)
External ears are clear of cerumen and are flaky /dry with evidence if recet excoriation/bleed. Mometasone cream prescribed

## 2023-01-17 NOTE — Patient Instructions (Signed)
Your ears are not impacted with cerumen, but your right one did have some bleeding recently (NOT THE EARDRUM  )  Mometasone steroid cream for itchy ears  If the CRP/ESR and chest x ray are normal,  I recommend resuming full strength zyrtec OR trying a steroid nasal spray to treat your allergies more aggressively  Referral to Dr Sherryll Burger [facility])  ;  he is very interested in preventing dementia   In the meantime,  I highly recommend reading "the End of Alzheimer's: The First Program to Prevent and Reverse Cognitive Decline"  by Dorice Lamas, MD

## 2023-01-17 NOTE — Assessment & Plan Note (Addendum)
CXR ordered.  Reviewed alternative causes other than bronchiectasis /bronchitis  including h/o allergic rhinitis and esophageal dysmotility

## 2023-01-17 NOTE — Assessment & Plan Note (Signed)
I have congratulated her in reduction of   BMI and encouraged  Continued weight loss with goal of 10% of body weigh over the next 6 months using a low glycemic index diet and regular exercise a minimum of 5 days per week.    

## 2023-01-17 NOTE — Assessment & Plan Note (Signed)
Still Using spirometry with improved symptoms but has had a productive cough since late September sputum production was scant and only after repeated bough. Sputum has been Yellow not green

## 2023-01-17 NOTE — Assessment & Plan Note (Signed)
She is in sinus rhythm today.

## 2023-01-17 NOTE — Progress Notes (Signed)
Subjective:  Patient ID: Heidi Walsh, female    DOB: [birth year]  Age: 77 y.o. MRN: [id number]  CC: The primary encounter diagnosis was Essential hypertension. Diagnoses of Prediabetes, Hyperlipidemia, unspecified hyperlipidemia type, Forgetfulness, History of concussion, Persistent cough for 3 weeks or longer, Bronchiectasis without complication (HCC), Paroxysmal atrial fibrillation (HCC), Morbid obesity (HCC), Concussion with loss of consciousness of 30 minutes or less, sequela (HCC), and Eczema of external ear, bilateral were also pertinent to this visit.   HPI Heidi Walsh presents for  Chief Complaint  Patient presents with   Medical Management of Chronic Issues    6 month follow up   1) obesity: she has lost 38 lbs since Nov 2021 with caareful diet and exercise.  Had not exercised since her fall during a walk in the woods  in  March  resulting in multiple injuries . She  has been reading about dementia and is currently worried that the concussion she suffered in March has increased her risk for  dementia because she has been confusing right and left  , not remembering how to play  her favorite pieces on the piano.     2) persistent cough:  husband thinks she has chronic bronchitis .  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  Had a cough last month that  was persistent  and minimally productive for several weeks.   Has allergic rhinitis but  reduced zyrtec to 5 mg due to dry eye   3)  h/o cerumen impaction right ear  needs recheck . Ears itch.  Right ear has dried blood in it    Outpatient Medications Prior to Visit  Medication Sig Dispense Refill   apixaban (ELIQUIS) 2.5 MG TABS tablet Take 2.5 mg by mouth 2 (two) times daily.     aspirin 81 MG tablet Take 81 mg by mouth 2 (two) times daily.     B Complex Vitamins (VITAMIN-B COMPLEX PO) Take 1 tablet by mouth daily.     buPROPion (WELLBUTRIN SR) 200 MG 12 hr tablet TAKE 1 TABLET BY MOUTH  EVERY DAY 90 tablet 1   Calcium Carbonate-Vit D-Min (CALCIUM 1200 PO) Take 1 capsule by mouth daily.     cetirizine (ZYRTEC) 10 MG tablet Take 10 mg by mouth daily.     diltiazem (CARDIZEM) 30 MG tablet Take 30 mg by mouth as needed.      docusate sodium (COLACE) 100 MG capsule Take 100 mg by mouth 2 (two) times daily.     HOMEOPATHIC PRODUCTS PO Take 1 tablet by mouth daily. NAD Supplement     ibuprofen (ADVIL) 600 MG tablet Take 1 tablet (600 mg total) by mouth every 8 (eight) hours as needed for headache. 20 tablet 0   KLOR-CON M20 20 MEQ tablet TAKE 1 TABLET BY MOUTH EVERY DAY 90 tablet 1   Melatonin 10 MG CAPS Take 1 capsule by mouth daily.     metFORMIN (GLUCOPHAGE-XR) 500 MG 24 hr tablet TAKE 1 TABLET BY MOUTH EVERY DAY WITH BREAKFAST 90 tablet 1   metoprolol succinate (TOPROL-XL) 25 MG 24 hr tablet Take 25 mg by mouth daily.     Multiple Vitamin (MULTIVITAMIN) tablet Take 1 tablet by mouth daily.     Multiple Vitamins-Minerals (HAIR SKIN AND NAILS FORMULA PO) Take 3 tablets by mouth daily.     ondansetron (ZOFRAN-ODT) 4 MG disintegrating tablet Take 1 tablet (4 mg total) by mouth every 8 (eight) hours as needed for nausea or  vomiting. 20 tablet 0   Probiotic Product (TRUNATURE DIGESTIVE PROBIOTIC) CAPS Take by mouth.     rosuvastatin (CRESTOR) 10 MG tablet Take 1 tablet (10 mg total) by mouth daily. 90 tablet 3   Semaglutide-Weight Management (WEGOVY) 2.4 MG/0.75ML SOAJ INJECT 2.4 MG INTO THE SKIN ONCE A WEEK FOR 28 DAYS. 3 mL 2   spironolactone (ALDACTONE) 25 MG tablet TAKE 1 TABLET (25 MG TOTAL) BY MOUTH DAILY. 90 tablet 3   No facility-administered medications prior to visit.    Review of Systems;  Patient denies headache, fevers, malaise, unintentional weight loss, skin rash, eye pain, sinus congestion and sinus pain, sore throat, dysphagia,  hemoptysis , cough, dyspnea, wheezing, chest pain, palpitations, orthopnea, edema, abdominal pain, nausea, melena, diarrhea, constipation,  flank pain, dysuria, hematuria, urinary  Frequency, nocturia, numbness, tingling, seizures,  Focal weakness, Loss of consciousness,  Tremor, insomnia, depression, anxiety, and suicidal ideation.      Objective:  BP 130/72   Pulse 94   Ht 5\' 7"  (1.702 m)   Wt 192 lb 9.6 oz (87.4 kg)   SpO2 98%   BMI 30.17 kg/m   BP Readings from Last 3 Encounters:  01/17/23 130/72  07/08/22 128/68  06/15/22 133/85    Wt Readings from Last 3 Encounters:  01/17/23 192 lb 9.6 oz (87.4 kg)  07/08/22 193 lb 12.8 oz (87.9 kg)  06/15/22 190 lb (86.2 kg)    Physical Exam Vitals reviewed.  Constitutional:      General: She is not in acute distress.    Appearance: Normal appearance. She is normal weight. She is not ill-appearing, toxic-appearing or diaphoretic.  HENT:     Head: Normocephalic.     Right Ear: Tympanic membrane normal. Laceration present. There is no impacted cerumen.     Left Ear: Tympanic membrane normal. There is no impacted cerumen.     Ears:     Comments: External canala=s are dry and flaky Eyes:     General: No scleral icterus.       Right eye: No discharge.        Left eye: No discharge.     Conjunctiva/sclera: Conjunctivae normal.  Cardiovascular:     Rate and Rhythm: Normal rate and regular rhythm.     Heart sounds: Normal heart sounds.  Pulmonary:     Effort: Pulmonary effort is normal. No respiratory distress.     Breath sounds: Normal breath sounds.  Musculoskeletal:        General: Normal range of motion.  Skin:    General: Skin is warm and dry.  Neurological:     General: No focal deficit present.     Mental Status: She is alert and oriented to person, place, and time. Mental status is at baseline.  Psychiatric:        Mood and Affect: Mood normal.        Behavior: Behavior normal.        Thought Content: Thought content normal.        Judgment: Judgment normal.   Lab Results  Component Value Date   HGBA1C 5.6 01/17/2023   HGBA1C 5.6 07/08/2022   HGBA1C  6.1 01/06/2022    Lab Results  Component Value Date   CREATININE 0.75 01/17/2023   CREATININE 0.72 07/08/2022   CREATININE 0.76 06/15/2022    Lab Results  Component Value Date   WBC 6.3 06/15/2022   HGB 13.8 06/15/2022   HCT 39.7 06/15/2022   PLT 197 06/15/2022   GLUCOSE  87 01/17/2023   CHOL 135 01/17/2023   TRIG 138.0 01/17/2023   HDL 69.80 01/17/2023   LDLDIRECT 54.0 01/17/2023   LDLCALC 38 01/17/2023   ALT 22 01/17/2023   AST 25 01/17/2023   NA 136 01/17/2023   K 4.1 01/17/2023   CL 102 01/17/2023   CREATININE 0.75 01/17/2023   BUN 16 01/17/2023   CO2 25 01/17/2023   TSH 1.80 01/06/2022   INR 1.0 11/24/2017   HGBA1C 5.6 01/17/2023   MICROALBUR <0.7 01/17/2023    CT HEAD WO CONTRAST ( )  Result Date: 06/15/2022 CLINICAL DATA:  Head and neck trauma EXAM: CT HEAD WITHOUT CONTRAST CT MAXILLOFACIAL WITHOUT CONTRAST CT CERVICAL SPINE WITHOUT CONTRAST TECHNIQUE: Multidetector CT imaging of the head, cervical spine, and maxillofacial structures were performed using the standard protocol without intravenous contrast. Multiplanar CT image reconstructions of the cervical spine and maxillofacial structures were also generated. RADIATION DOSE REDUCTION: This exam was performed according to the departmental dose-optimization program which includes automated exposure control, adjustment of the mA and/or kV according to patient size and/or use of iterative reconstruction technique. COMPARISON:  None Available. FINDINGS: CT HEAD FINDINGS Brain: No evidence of acute infarction, hemorrhage, hydrocephalus, extra-axial collection or mass lesion/mass effect. Mild periventricular white matter hypodensity. Vascular: No hyperdense vessel or unexpected calcification. CT FACIAL BONES FINDINGS Skull: Normal. Negative for fracture or focal lesion. Facial bones: No displaced fractures or dislocations. Sinuses/Orbits: No acute finding. Other: None. CT CERVICAL SPINE FINDINGS Alignment: Degenerative  straightening and reversal of the normal cervical lordosis. Skull base and vertebrae: No acute fracture. No primary bone lesion or focal pathologic process. Soft tissues and spinal canal: No prevertebral fluid or swelling. No visible canal hematoma. Disc levels: Generally moderate disc space height loss and osteophytosis throughout the cervical spine, focally severe at C5-C6. Upper chest: Negative. Other: None. IMPRESSION: 1. No acute intracranial pathology. Small-vessel white matter disease. 2. No displaced fractures or dislocations of the facial bones. 3. No fracture or static subluxation of the cervical spine. 4. Generally moderate disc space height loss and osteophytosis throughout the cervical spine, focally severe at C5-C6. Electronically Signed   By: Jearld Lesch M.D.   On: 06/15/2022 12:37   CT Cervical Spine Wo Contrast  Result Date: 06/15/2022 CLINICAL DATA:  Head and neck trauma EXAM: CT HEAD WITHOUT CONTRAST CT MAXILLOFACIAL WITHOUT CONTRAST CT CERVICAL SPINE WITHOUT CONTRAST TECHNIQUE: Multidetector CT imaging of the head, cervical spine, and maxillofacial structures were performed using the standard protocol without intravenous contrast. Multiplanar CT image reconstructions of the cervical spine and maxillofacial structures were also generated. RADIATION DOSE REDUCTION: This exam was performed according to the departmental dose-optimization program which includes automated exposure control, adjustment of the mA and/or kV according to patient size and/or use of iterative reconstruction technique. COMPARISON:  None Available. FINDINGS: CT HEAD FINDINGS Brain: No evidence of acute infarction, hemorrhage, hydrocephalus, extra-axial collection or mass lesion/mass effect. Mild periventricular white matter hypodensity. Vascular: No hyperdense vessel or unexpected calcification. CT FACIAL BONES FINDINGS Skull: Normal. Negative for fracture or focal lesion. Facial bones: No displaced fractures or  dislocations. Sinuses/Orbits: No acute finding. Other: None. CT CERVICAL SPINE FINDINGS Alignment: Degenerative straightening and reversal of the normal cervical lordosis. Skull base and vertebrae: No acute fracture. No primary bone lesion or focal pathologic process. Soft tissues and spinal canal: No prevertebral fluid or swelling. No visible canal hematoma. Disc levels: Generally moderate disc space height loss and osteophytosis throughout the cervical spine, focally severe at C5-C6. Upper chest:  Negative. Other: None. IMPRESSION: 1. No acute intracranial pathology. Small-vessel white matter disease. 2. No displaced fractures or dislocations of the facial bones. 3. No fracture or static subluxation of the cervical spine. 4. Generally moderate disc space height loss and osteophytosis throughout the cervical spine, focally severe at C5-C6. Electronically Signed   By: Jearld Lesch M.D.   On: 06/15/2022 12:37   CT Maxillofacial Wo Contrast  Result Date: 06/15/2022 CLINICAL DATA:  Head and neck trauma EXAM: CT HEAD WITHOUT CONTRAST CT MAXILLOFACIAL WITHOUT CONTRAST CT CERVICAL SPINE WITHOUT CONTRAST TECHNIQUE: Multidetector CT imaging of the head, cervical spine, and maxillofacial structures were performed using the standard protocol without intravenous contrast. Multiplanar CT image reconstructions of the cervical spine and maxillofacial structures were also generated. RADIATION DOSE REDUCTION: This exam was performed according to the departmental dose-optimization program which includes automated exposure control, adjustment of the mA and/or kV according to patient size and/or use of iterative reconstruction technique. COMPARISON:  None Available. FINDINGS: CT HEAD FINDINGS Brain: No evidence of acute infarction, hemorrhage, hydrocephalus, extra-axial collection or mass lesion/mass effect. Mild periventricular white matter hypodensity. Vascular: No hyperdense vessel or unexpected calcification. CT FACIAL BONES  FINDINGS Skull: Normal. Negative for fracture or focal lesion. Facial bones: No displaced fractures or dislocations. Sinuses/Orbits: No acute finding. Other: None. CT CERVICAL SPINE FINDINGS Alignment: Degenerative straightening and reversal of the normal cervical lordosis. Skull base and vertebrae: No acute fracture. No primary bone lesion or focal pathologic process. Soft tissues and spinal canal: No prevertebral fluid or swelling. No visible canal hematoma. Disc levels: Generally moderate disc space height loss and osteophytosis throughout the cervical spine, focally severe at C5-C6. Upper chest: Negative. Other: None. IMPRESSION: 1. No acute intracranial pathology. Small-vessel white matter disease. 2. No displaced fractures or dislocations of the facial bones. 3. No fracture or static subluxation of the cervical spine. 4. Generally moderate disc space height loss and osteophytosis throughout the cervical spine, focally severe at C5-C6. Electronically Signed   By: Jearld Lesch M.D.   On: 06/15/2022 12:37    Assessment & Plan:  .Essential hypertension -     Comprehensive metabolic panel -     Microalbumin / creatinine urine ratio  Prediabetes -     Comprehensive metabolic panel -     Microalbumin / creatinine urine ratio -     Hemoglobin A1c  Hyperlipidemia, unspecified hyperlipidemia type -     Lipid panel -     LDL cholesterol, direct  Forgetfulness -     Ambulatory referral to Neurology  History of concussion -     Ambulatory referral to Neurology  Persistent cough for 3 weeks or longer Assessment & Plan: CXR ordered.  Reviewed alternative causes other than bronchiectasis /bronchitis  including h/o allergic rhinitis and esophageal dysmotility  Orders: -     DG Chest 2 View; Future -     Sedimentation rate -     C-reactive protein  Bronchiectasis without complication [facility]) Assessment & Plan: Still Using spirometry with improved symptoms but has had a productive cough since  late September sputum production was scant and only after repeated bough. Sputum has been Yellow not green    Paroxysmal atrial fibrillation (HCC) Assessment & Plan: She is in sinus rhythm today    Morbid obesity (HCC) Assessment & Plan: I have congratulated her in reduction of   BMI and encouraged  Continued weight loss with goal of 10% of body weigh over the next 6 months using a low glycemic  index diet and regular exercise a minimum of 5 days per week.     Concussion with loss of consciousness of 30 minutes or less, sequela (HCC) Assessment & Plan: She has had some memory lapses since her concussion in March 2024 and is concerned about an increased risk for dementia.  Referring to Dr Sherryll Burger for evaluation    Eczema of external ear, bilateral Assessment & Plan: External ears are clear of cerumen and are flaky /dry with evidence if recet excoriation/bleed. Mometasone cream prescribed    Other orders -     Mometasone Furoate; Apply once dail y to ear canal as needed for itching  Dispense: 15 g; Refill: 1     I provided  47 minutes of face-to-face time during this encounter reviewing patient's last visit with me, patient's  most recent visit with cardiology,  gastroenterology , and neurology,  recent surgical and non surgical procedures, previous  labs and imaging studies, counseling on currently addressed issues,  and post visit ordering to diagnostics and therapeutics .   Follow-up: No follow-ups on file.   Sherlene Shams, MD

## 2023-01-17 NOTE — Assessment & Plan Note (Signed)
She has had some memory lapses since her concussion in March 2024 and is concerned about an increased risk for dementia.  Referring to Dr Sherryll Burger for evaluation

## 2023-01-25 ENCOUNTER — Other Ambulatory Visit: Payer: Self-pay | Admitting: Internal Medicine

## 2023-01-25 DIAGNOSIS — K760 Fatty (change of) liver, not elsewhere classified: Secondary | ICD-10-CM

## 2023-02-08 DIAGNOSIS — G4733 Obstructive sleep apnea (adult) (pediatric): Secondary | ICD-10-CM | POA: Diagnosis not present

## 2023-02-09 ENCOUNTER — Other Ambulatory Visit: Payer: Self-pay | Admitting: Internal Medicine

## 2023-02-10 NOTE — Telephone Encounter (Signed)
Refilled

## 2023-03-01 ENCOUNTER — Other Ambulatory Visit: Payer: Self-pay | Admitting: Internal Medicine

## 2023-04-01 DIAGNOSIS — I10 Essential (primary) hypertension: Secondary | ICD-10-CM | POA: Diagnosis not present

## 2023-04-01 DIAGNOSIS — Z880 Allergy status to penicillin: Secondary | ICD-10-CM | POA: Diagnosis not present

## 2023-04-01 DIAGNOSIS — I83812 Varicose veins of left lower extremities with pain: Secondary | ICD-10-CM | POA: Diagnosis not present

## 2023-04-01 DIAGNOSIS — Z66 Do not resuscitate: Secondary | ICD-10-CM | POA: Diagnosis not present

## 2023-04-01 DIAGNOSIS — I83813 Varicose veins of bilateral lower extremities with pain: Secondary | ICD-10-CM | POA: Diagnosis not present

## 2023-04-01 DIAGNOSIS — Z79899 Other long term (current) drug therapy: Secondary | ICD-10-CM | POA: Diagnosis not present

## 2023-04-12 ENCOUNTER — Telehealth: Payer: Self-pay | Admitting: *Deleted

## 2023-04-12 ENCOUNTER — Ambulatory Visit (INDEPENDENT_AMBULATORY_CARE_PROVIDER_SITE_OTHER): Payer: Medicare PPO | Admitting: *Deleted

## 2023-04-12 VITALS — BP 134/78 | HR 60 | Temp 97.0°F | Ht 67.0 in | Wt 192.0 lb

## 2023-04-12 DIAGNOSIS — Z0000 Encounter for general adult medical examination without abnormal findings: Secondary | ICD-10-CM | POA: Diagnosis not present

## 2023-04-12 MED ORDER — PREDNISONE 10 MG PO TABS
ORAL_TABLET | ORAL | 0 refills | Status: DC
Start: 2023-04-12 — End: 2023-05-23

## 2023-04-12 MED ORDER — ALPRAZOLAM 0.25 MG PO TABS: 0.1250 mg | ORAL_TABLET | Freq: Two times a day (BID) | ORAL | 2 refills | Status: AC | PRN

## 2023-04-12 NOTE — Progress Notes (Signed)
Subjective:   Heidi Walsh is a 78 y.o. female who presents for Medicare Annual (Subsequent) preventive examination.  Visit Complete: In person   Cardiac Risk Factors include: advanced age (>74men, >85 women);hypertension;obesity (BMI >30kg/m2)     Objective:    Today's Vitals   04/12/23 0900  BP: 134/78  Pulse: 60  Temp: (!) 97 F (36.1 C)  TempSrc: Skin  SpO2: 97%  Weight: 192 lb (87.1 kg)  Height: 5\' 7"  (1.702 m)   Body mass index is 30.07 kg/m.     04/12/2023    9:27 AM 03/23/2022   12:54 PM 03/18/2021    3:06 PM 01/17/2020    1:15 PM 01/16/2019   12:59 PM 12/22/2017    3:17 PM 12/21/2016    9:05 AM  Advanced Directives  Does Patient Have a Medical Advance Directive? Yes Yes Yes Yes Yes Yes Yes  Type of Estate agent of [place];Living will Healthcare Power of [place];Living will Healthcare Power of [place];Living will Healthcare Power of [place];Living will Healthcare Power of [place];Living will Healthcare Power of [place];Living will Healthcare Power of [place];Living will  Does patient want to make changes to medical advance directive?  No - Patient declined  No - Patient declined No - Patient declined No - Patient declined No - Patient declined  Copy of Healthcare Power of Attorney in Chart? No - copy requested No - copy requested No - copy requested No - copy requested No - copy requested No - copy requested No - copy requested    Current Medications (verified) Outpatient Encounter Medications as of 04/12/2023  Medication Sig   apixaban (ELIQUIS) 2.5 MG TABS tablet Take 2.5 mg by mouth 2 (two) times daily.   B Complex Vitamins (VITAMIN-B COMPLEX PO) Take 1 tablet by mouth daily.   buPROPion (WELLBUTRIN SR) 200 MG 12 hr tablet TAKE 1 TABLET BY MOUTH EVERY DAY   Calcium Carbonate-Vit D-Min (CALCIUM 1200 PO) Take 1 capsule by mouth daily.   cetirizine (ZYRTEC) 10 MG tablet Take 10 mg by mouth daily.   diltiazem (CARDIZEM) 30 MG  tablet Take 30 mg by mouth as needed.    docusate sodium (COLACE) 100 MG capsule Take 100 mg by mouth 2 (two) times daily.   HOMEOPATHIC PRODUCTS PO Take 1 tablet by mouth daily. NAD Supplement   KLOR-CON M20 20 MEQ tablet TAKE 1 TABLET BY MOUTH EVERY DAY   Melatonin 10 MG CAPS Take 1 capsule by mouth daily.   metFORMIN (GLUCOPHAGE-XR) 500 MG 24 hr tablet TAKE 1 TABLET BY MOUTH EVERY DAY WITH BREAKFAST   metoprolol succinate (TOPROL-XL) 25 MG 24 hr tablet Take 25 mg by mouth daily.   mometasone (ELOCON) 0.1 % cream Apply once dail y to ear canal as needed for itching   Multiple Vitamin (MULTIVITAMIN) tablet Take 1 tablet by mouth daily.   NON FORMULARY Magnesium gel Cap takes one at bedtimes   ondansetron (ZOFRAN-ODT) 4 MG disintegrating tablet Take 1 tablet (4 mg total) by mouth every 8 (eight) hours as needed for nausea or vomiting.   Probiotic Product (TRUNATURE DIGESTIVE PROBIOTIC) CAPS Take by mouth.   rosuvastatin (CRESTOR) 10 MG tablet TAKE 1 TABLET BY MOUTH EVERY DAY   Semaglutide-Weight Management (WEGOVY) 2.4 MG/0.75ML SOAJ INJECT 2.4 MG INTO THE SKIN ONCE A WEEK FOR 28 DAYS.   spironolactone (ALDACTONE) 25 MG tablet TAKE 1 TABLET (25 MG TOTAL) BY MOUTH DAILY.   ibuprofen (ADVIL) 600 MG tablet Take 1 tablet (600 mg total)  by mouth every 8 (eight) hours as needed for headache. (Patient not taking: Reported on 04/12/2023)   Multiple Vitamins-Minerals (HAIR SKIN AND NAILS FORMULA PO) Take 3 tablets by mouth daily. (Patient not taking: Reported on 04/12/2023)   [DISCONTINUED] aspirin 81 MG tablet Take 81 mg by mouth 2 (two) times daily. (Patient not taking: Reported on 04/12/2023)   No facility-administered encounter medications on file as of 04/12/2023.    Allergies (verified) Oxycodone, Penicillins, Ancef [cefazolin], Cephalexin, and Rivaroxaban   History: Past Medical History:  Diagnosis Date   Actinic keratosis    managed by Jimmy Footman   Atrial fibrillation [facility]) 2010   Followed  by Dr. Mariel Kansky   Bladder cancer [facility])    Bladder cancer (HCC) 11/18/2016    1 cm papillar tumor covering   left UO Resected at [facility] Sept 6 by Pervis Hocking and ureteral stent placed.  Stent removed sept 20.  Repeat cystoscopy 3 months [facility]     breast cancer 2010   s/p mastectomy   breast cancer    s/p mastectomy    Breast cyst 2003   aspirated, fibroystics breasts with stable calcifications on left breast   Hypertension    Leukopenia    Mitral regurgitation    and prolapse   Osteopenia 1954   Stable by repeat scans   UTI (urinary tract infection) due to Enterococcus 01/19/2022   MDR>  Requiring [facility] hospitalization for IV gentamycin Jan 16 2022.  Fosfomycin prescribed for future use to prevent hospitalizations     Past Surgical History:  Procedure Laterality Date   ATRIAL ABLATION SURGERY  Dec 2012   [facility]   CESAREAN SECTION     x 2   KNEE ARTHROSCOPY W/ MENISCECTOMY  1997   MASTECTOMY  02/2009   Bilateral   MASTECTOMY     Right and Left   SHOULDER SURGERY     left rotator cuff   TOTAL HIP ARTHROPLASTY Right    TOTAL KNEE ARTHROPLASTY  02/2014   Left knee   Family History  Problem Relation Age of Onset   COPD Mother    Hypertension Father    Hepatitis Sister        Hepatitis C from a dental procedure   Social History   Socioeconomic History   Marital status: Married    Spouse name: Not on file   Number of children: Not on file   Years of education: Not on file   Highest education level: Not on file  Occupational History   Not on file  Tobacco Use   Smoking status: Never   Smokeless tobacco: Never  Vaping Use   Vaping status: Never Used  Substance and Sexual Activity   Alcohol use: Yes    Alcohol/week: 0.0 standard drinks of alcohol    Comment: occ   Drug use: No   Sexual activity: Not on file  Other Topics Concern   Not on file  Social History Narrative   married   Social Drivers of Corporate investment banker Strain: Low Risk   (04/12/2023)   Overall Financial Resource Strain (CARDIA)    Difficulty of Paying Living Expenses: Not hard at all  Food Insecurity: No Food Insecurity (04/12/2023)   Hunger Vital Sign    Worried About Running Out of Food in the Last Year: Never true    Ran Out of Food in the Last Year: Never true  Transportation Needs: No Transportation Needs (04/12/2023)   PRAPARE - Transportation  Lack of Transportation (Medical): No    Lack of Transportation (Non-Medical): No  Physical Activity: Sufficiently Active (04/12/2023)   Exercise Vital Sign    Days of Exercise per Week: 7 days    Minutes of Exercise per Session: 60 min  Stress: Stress Concern Present (04/12/2023)   [employer] of Occupational Health - Occupational Stress Questionnaire    Feeling of Stress : To some extent  Social Connections: Moderately Integrated (04/12/2023)   Social Connection and Isolation Panel [NHANES]    Frequency of Communication with Friends and Family: More than three times a week    Frequency of Social Gatherings with Friends and Family: More than three times a week    Attends Religious Services: More than 4 times per year    Active Member of [employer] or Organizations: No    Attends Engineer, structural: Never    Marital Status: Married    Tobacco Counseling Counseling given: Not Answered   Clinical Intake:  Pre-visit preparation completed: Yes  Pain : No/denies pain     BMI - recorded: 30.07 Nutritional Status: BMI > 30  Obese Nutritional Risks: None Diabetes: No  How often do you need to have someone help you when you read instructions, pamphlets, or other written materials from your doctor or pharmacy?: 1 - Never  Interpreter Needed?: No  Information entered by :: R. Ahaan Zobrist LPN   Activities of Daily Living    04/12/2023    9:05 AM  In your present state of health, do you have any difficulty performing the following activities:  Hearing? 0  Vision? 0  Comment readers   Difficulty concentrating or making decisions? 0  Walking or climbing stairs? 0  Dressing or bathing? 0  Doing errands, shopping? 0  Preparing Food and eating ? N  Using the Toilet? N  In the past six months, have you accidently leaked urine? N  Do you have problems with loss of bowel control? N  Managing your Medications? N  Managing your Finances? N  Housekeeping or managing your Housekeeping? N    Patient Care Team: Sherlene Shams, MD as PCP - General (Internal Medicine)  Indicate any recent Medical Services you may have received from other than [facility] providers in the past year (date may be approximate).     Assessment:   This is a routine wellness examination for Heidi Walsh.  Hearing/Vision screen Hearing Screening - Comments:: No issues Vision Screening - Comments:: readers   Goals Addressed             This Visit's Progress    Patient Stated       Wants to do balance of nature       Depression Screen    04/12/2023    9:19 AM 01/17/2023    9:35 AM 07/08/2022    9:40 AM 07/08/2022    9:10 AM 03/23/2022   12:53 PM 01/20/2022    8:43 AM 01/06/2022    9:03 AM  PHQ 2/9 Scores  PHQ - 2 Score 0 0 0 0 0 1 0  PHQ- 9 Score 3  0   1 1    Fall Risk    04/12/2023    9:07 AM 01/17/2023    9:35 AM 07/08/2022    9:10 AM 03/23/2022   12:42 PM 01/20/2022    8:43 AM  Fall Risk   Falls in the past year? 1 1 1 1  0  Number falls in past yr: 0 0 0 0  Injury with Fall? 1 1 1 1    Comment    Last fall about 6 weeks ago. Nose bleed. Did not seek medical attention. Followed by pcp asneeded.   Risk for fall due to : History of fall(s);Impaired balance/gait History of fall(s) History of fall(s)  No Fall Risks  Risk for fall due to: Comment may have tripped on something      Follow up Falls evaluation completed;Falls prevention discussed Falls evaluation completed Falls evaluation completed Falls evaluation completed;Falls prevention discussed;Education provided Falls evaluation completed     MEDICARE RISK AT HOME: Medicare Risk at Home Any stairs in or around the home?: Yes If so, are there any without handrails?: No Home free of loose throw rugs in walkways, pet beds, electrical cords, etc?: Yes Adequate lighting in your home to reduce risk of falls?: Yes Life alert?: No Use of a cane, walker or w/c?: No Grab bars in the bathroom?: Yes Shower chair or bench in shower?: Yes Elevated toilet seat or a handicapped toilet?: Yes  TIMED UP AND GO:  Was the test performed?  Yes  Length of time to ambulate 10 feet: 8 sec Gait slow and steady without use of assistive device    Cognitive Function:    12/22/2017    3:21 PM 12/21/2016    9:07 AM  MMSE - Mini Mental State Exam  Orientation to time 5 5  Orientation to Place 5 5  Registration 3 3  Attention/ Calculation 5 5  Recall 3 3  Language- name 2 objects 2 2  Language- repeat 1 1  Language- follow 3 step command 3 3  Language- read & follow direction 1 1  Write a sentence 1 1  Copy design 1 1  Total score 30 30        04/12/2023    9:27 AM 03/23/2022   12:54 PM 01/16/2019    1:10 PM  6CIT Screen  What Year? 0 points 0 points 0 points  What month? 0 points 0 points 0 points  What time? 0 points 0 points 0 points  Count back from 20 0 points  0 points  Months in reverse 0 points 0 points 0 points  Repeat phrase 0 points  0 points  Total Score 0 points  0 points    Immunizations Immunization History  Administered Date(s) Administered   Fluad Quad(high Dose 65+) 01/15/2019, 12/11/2021   Fluad Trivalent(High Dose 65+) 01/09/2023   Influenza Split 02/23/2011, 02/20/2012   Influenza Whole 02/04/2016   Influenza, High Dose Seasonal PF 01/01/2015, 02/06/2020, 12/25/2020   Influenza,inj,Quad PF,6+ Mos 12/25/2012   Influenza,inj,quad, With Preservative 12/20/2016, 12/20/2017   Influenza-Unspecified 12/16/2016, 01/03/2018   Moderna Covid-19 Vaccine Bivalent Booster 74yrs & up 12/31/2020   Moderna  Sars-Covid-2 Vaccination 05/02/2019, 05/30/2019, 01/14/2020, 10/17/2020   PFIZER Comirnaty(Gray Top)Covid-19 Tri-Sucrose Vaccine 12/21/2021   PPD Test 04/25/2019   Pneumococcal Conjugate-13 09/20/2013   Pneumococcal Polysaccharide-23 03/19/2014   Respiratory Syncytial Virus Vaccine,Recomb Aduvanted(Arexvy) 12/11/2021   Tdap 11/24/2006, 12/27/2013   Zoster, Live 12/26/2007    TDAP status: Up to date  Flu Vaccine status: Up to date  Pneumococcal vaccine status: Up to date  Covid-19 vaccine status: Completed vaccines  Qualifies for Shingles Vaccine? Yes   Zostavax completed Yes   Shingrix Completed?: No.    Education has been provided regarding the importance of this vaccine. Patient has been advised to call insurance company to determine out of pocket expense if they have not yet received this vaccine. Advised may  also receive vaccine at local pharmacy or Health Dept. Verbalized acceptance and understanding.  Screening Tests Health Maintenance  Topic Date Due   COVID-19 Vaccine (7 - 2024-25 season) 11/21/2022   Medicare Annual Wellness (AWV)  03/24/2023   Zoster Vaccines- Shingrix (2 of 2) 05/17/2023   DTaP/Tdap/Td (3 - Td or Tdap) 12/28/2023   Colonoscopy  11/25/2032   Pneumonia Vaccine 72+ Years old  Completed   INFLUENZA VACCINE  Completed   DEXA SCAN  Completed   Hepatitis C Screening  Completed   HPV VACCINES  Aged Out    Health Maintenance  Health Maintenance Due  Topic Date Due   COVID-19 Vaccine (7 - 2024-25 season) 11/21/2022   Medicare Annual Wellness (AWV)  03/24/2023    Colorectal cancer screening: Type of screening: Colonoscopy. Completed 11/2022. Repeat every 10 years No longer needed because of age  Mammogram status not needed has no breast    Bone Density status: Completed 12/2021. Results reflect: Bone density results: OSTEOPENIA. Repeat every 2 years.  Lung Cancer Screening: (Low Dose CT Chest recommended if Age 15-80 years, 20 pack-year currently  smoking OR have quit w/in 15years.) does not qualify.     Additional Screening:  Hepatitis C Screening: does qualify; Completed 12/2019  Vision Screening: Recommended annual ophthalmology exams for early detection of glaucoma and other disorders of the eye. Is the patient up to date with their annual eye exam?  Yes  Who is the provider or what is the name of the office in which the patient attends annual eye exams? [facility] If pt is not established with a provider, would they like to be referred to a provider to establish care? No .   Dental Screening: Recommended annual dental exams for proper oral hygiene   Community Resource Referral / Chronic Care Management: CRR required this visit?  No   CCM required this visit?  No     Plan:     I have personally reviewed and noted the following in the patient's chart:   Medical and social history Use of alcohol, tobacco or illicit drugs  Current medications and supplements including opioid prescriptions. Patient is not currently taking opioid prescriptions. Functional ability and status Nutritional status Physical activity Advanced directives List of other physicians Hospitalizations, surgeries, and ER visits in previous 12 months Vitals Screenings to include cognitive, depression, and falls Referrals and appointments  In addition, I have reviewed and discussed with patient certain preventive protocols, quality metrics, and best practice recommendations. A written personalized care plan for preventive services as well as general preventive health recommendations were provided to patient.     Sydell Axon, LPN   2/44/0102   After Visit Summary: (MyChart) Due to this being a telephonic visit, the after visit summary with patients personalized plan was offered to patient via MyChart   Nurse Notes: Telephone note sent to PCP

## 2023-04-12 NOTE — Telephone Encounter (Addendum)
Patient was in the office today for her AWV. Patient requested that Dr. Darrick Huntsman send her in a script for Xanax. Patient stated that she was given a script some time back and was able to make it last for a long time because she cuts them into quarters. Patient stated that she is having problems sleeping because of her husbands declining health. Patient also stated that she has a bad reactions to bug bites and likes to keep Prednisone on hand. Patient stated that she recently had a bad bug bite and use the last of the Prednisone that she had on hand. Patient requested that Dr. Darrick Huntsman send her another script in for Prednisone for her to have on hand. Patient is aware that Dr. Darrick Huntsman is out of the office today. Patient stated that she would like a referral to an allergist. Pharmacy [facility]

## 2023-04-12 NOTE — Patient Instructions (Signed)
Heidi Walsh , Thank you for taking time to come for your Medicare Wellness Visit. I appreciate your ongoing commitment to your health goals. Please review the following plan we discussed and let me know if I can assist you in the future.   Referrals/Orders/Follow-Ups/Clinician Recommendations: A phone note has been sent to Dr. Darrick Huntsman regarding your request today at your visit.  This is a list of the screening recommended for you and due dates:  Health Maintenance  Topic Date Due   COVID-19 Vaccine (7 - 2024-25 season) 11/21/2022   Zoster (Shingles) Vaccine (2 of 2) 05/17/2023   DTaP/Tdap/Td vaccine (3 - Td or Tdap) 12/28/2023   Medicare Annual Wellness Visit  04/11/2024   Colon Cancer Screening  11/25/2032   Pneumonia Vaccine  Completed   Flu Shot  Completed   DEXA scan (bone density measurement)  Completed   Hepatitis C Screening  Completed   HPV Vaccine  Aged Out    Advanced directives: (Copy Requested) Please bring a copy of your health care power of attorney and living will to the office to be added to your chart at your convenience.  Next Medicare Annual Wellness Visit scheduled for next year: Yes 04/13/24 @ 9:30

## 2023-04-12 NOTE — Addendum Note (Signed)
Addended by: Sherlene Shams on: 04/12/2023 04:33 PM   Modules accepted: Orders

## 2023-04-13 NOTE — Telephone Encounter (Signed)
Pt is aware and appt has been scheduled.

## 2023-04-27 ENCOUNTER — Ambulatory Visit: Payer: Medicare PPO | Admitting: Internal Medicine

## 2023-05-11 ENCOUNTER — Other Ambulatory Visit: Payer: Self-pay | Admitting: Internal Medicine

## 2023-05-11 ENCOUNTER — Encounter: Payer: Self-pay | Admitting: Internal Medicine

## 2023-05-11 ENCOUNTER — Other Ambulatory Visit (HOSPITAL_COMMUNITY): Payer: Self-pay

## 2023-05-11 MED ORDER — METOPROLOL SUCCINATE ER 25 MG PO TB24
25.0000 mg | ORAL_TABLET | Freq: Every day | ORAL | 1 refills | Status: DC
  Filled 2023-05-11: qty 90, 90d supply, fill #0

## 2023-05-12 ENCOUNTER — Other Ambulatory Visit: Payer: Self-pay

## 2023-05-16 DIAGNOSIS — I10 Essential (primary) hypertension: Secondary | ICD-10-CM | POA: Diagnosis not present

## 2023-05-16 DIAGNOSIS — Z09 Encounter for follow-up examination after completed treatment for conditions other than malignant neoplasm: Secondary | ICD-10-CM | POA: Diagnosis not present

## 2023-05-16 DIAGNOSIS — I83813 Varicose veins of bilateral lower extremities with pain: Secondary | ICD-10-CM | POA: Diagnosis not present

## 2023-05-16 DIAGNOSIS — Z7901 Long term (current) use of anticoagulants: Secondary | ICD-10-CM | POA: Diagnosis not present

## 2023-05-16 DIAGNOSIS — Z79899 Other long term (current) drug therapy: Secondary | ICD-10-CM | POA: Diagnosis not present

## 2023-05-16 DIAGNOSIS — I83812 Varicose veins of left lower extremities with pain: Secondary | ICD-10-CM | POA: Diagnosis not present

## 2023-05-16 DIAGNOSIS — Z880 Allergy status to penicillin: Secondary | ICD-10-CM | POA: Diagnosis not present

## 2023-05-17 ENCOUNTER — Other Ambulatory Visit: Payer: Self-pay

## 2023-05-18 ENCOUNTER — Other Ambulatory Visit: Payer: Self-pay | Admitting: Internal Medicine

## 2023-05-19 ENCOUNTER — Other Ambulatory Visit: Payer: Self-pay | Admitting: Internal Medicine

## 2023-05-19 DIAGNOSIS — Z79899 Other long term (current) drug therapy: Secondary | ICD-10-CM | POA: Diagnosis not present

## 2023-05-19 DIAGNOSIS — G3184 Mild cognitive impairment, so stated: Secondary | ICD-10-CM | POA: Diagnosis not present

## 2023-05-19 DIAGNOSIS — G4733 Obstructive sleep apnea (adult) (pediatric): Secondary | ICD-10-CM | POA: Diagnosis not present

## 2023-05-23 ENCOUNTER — Ambulatory Visit: Payer: Medicare PPO | Admitting: Internal Medicine

## 2023-05-23 ENCOUNTER — Encounter: Payer: Self-pay | Admitting: Internal Medicine

## 2023-05-23 VITALS — BP 128/82 | HR 90 | Ht 67.0 in | Wt 195.0 lb

## 2023-05-23 DIAGNOSIS — I480 Paroxysmal atrial fibrillation: Secondary | ICD-10-CM

## 2023-05-23 DIAGNOSIS — I700 Atherosclerosis of aorta: Secondary | ICD-10-CM

## 2023-05-23 DIAGNOSIS — F320 Major depressive disorder, single episode, mild: Secondary | ICD-10-CM | POA: Diagnosis not present

## 2023-05-23 DIAGNOSIS — L03114 Cellulitis of left upper limb: Secondary | ICD-10-CM

## 2023-05-23 DIAGNOSIS — S060X0S Concussion without loss of consciousness, sequela: Secondary | ICD-10-CM

## 2023-05-23 MED ORDER — TRIAMCINOLONE ACETONIDE 0.5 % EX OINT: 1.0000 | TOPICAL_OINTMENT | Freq: Two times a day (BID) | CUTANEOUS | 0 refills | Status: AC

## 2023-05-23 MED ORDER — BUPROPION HCL ER (SR) 200 MG PO TB12: 200.0000 mg | ORAL_TABLET | Freq: Every day | ORAL | 1 refills | Status: DC

## 2023-05-23 MED ORDER — DOXYCYCLINE HYCLATE 100 MG PO TABS
100.0000 mg | ORAL_TABLET | Freq: Two times a day (BID) | ORAL | 0 refills | Status: DC
Start: 2023-05-23 — End: 2023-07-11

## 2023-05-23 MED ORDER — PREDNISONE 10 MG PO TABS
ORAL_TABLET | ORAL | 0 refills | Status: DC
Start: 2023-05-23 — End: 2023-08-29

## 2023-05-23 MED ORDER — METOPROLOL SUCCINATE ER 25 MG PO TB24: 25.0000 mg | ORAL_TABLET | Freq: Every day | ORAL | 1 refills | Status: DC

## 2023-05-23 NOTE — Assessment & Plan Note (Signed)
 She is in sinus rhythm today;  continue DOAC

## 2023-05-23 NOTE — Assessment & Plan Note (Signed)
 Symptoms have improved with continued use of wellbutrin .  No changes today

## 2023-05-23 NOTE — Patient Instructions (Signed)
 [facility]  ( the previous rx was sent to the wrong pharmacy)   Create a "bugbite kit" for the next episode: prednisone taper,  doxycycline, triamcinolone ointment

## 2023-05-23 NOTE — Assessment & Plan Note (Signed)
 She has been referred to Dr Sherryll Burger for evaluation secondary to recurrent memory lapses since her concussion in March 2024 /  workup is in progress

## 2023-05-23 NOTE — Progress Notes (Signed)
 Subjective:  Patient ID: Heidi Walsh, female    DOB: 10-20-1945  Age: 78 y.o. MRN: [id number]  CC: The primary encounter diagnosis was Cellulitis of forearm, left. Diagnoses of Current mild episode of major depressive disorder without prior episode [facility]), Thoracic aortic atherosclerosis (HCC), Paroxysmal atrial fibrillation (HCC), Morbid obesity (HCC), and Concussion without loss of consciousness, sequela (HCC) were also pertinent to this visit.   HPI Heidi Walsh presents for  Chief Complaint  Patient presents with   Medical Management of Chronic Issues    Discuss the need for a referral to an allergist   Conception is a 78 yr old female with a history of BRCA  s/p bilateral mastectomy  with lymphedema of left arm,  who presents with historry  severe reaction to insect bites.   She has requested a referral to Allergy at the advice of her husband,  a retired rheumatologist, because a recent insect bite became very irritated and red.  The bite occurred on her  left arm which has lymphedema  2) cognitive concerns:  she became concerned about future risk for dementia because of prior concussions and was referred to neurology.  Reviewed work up/labs by Sherryll Burger.  She scored extremely well on MMSE/SLUMs.    A brain  MRI has been ordered.  She has been counselled about the neurotoxicity  of alcohol and was advised to  abstain from all alcohol.   Obesity:  she has been exercising ,  taking wegovy.  Using a Rebounder (trampoline)  planning on returning to the gym.    OA:  taking Sam E   taking 100 mg B6 daily  on the advice of Orthopedics ,  and 250 mg magnesium at bedtime   Recurrent left leg cramps since completing  chemotherapy with Taxol :  saw [facility],  had left saphenous vein ablated ,  leg cramps have resolved.  Having sclerotherapy on other veins next month    Outpatient Medications Prior to Visit  Medication Sig Dispense Refill   ALPRAZolam (XANAX) 0.25 MG tablet Take 0.5 tablets  (0.125 mg total) by mouth 2 (two) times daily as needed for anxiety. 30 tablet 2   apixaban (ELIQUIS) 2.5 MG TABS tablet Take 2.5 mg by mouth 2 (two) times daily.     B Complex Vitamins (VITAMIN-B COMPLEX PO) Take 1 tablet by mouth daily.     Calcium Carbonate-Vit D-Min (CALCIUM 1200 PO) Take 1 capsule by mouth daily.     cetirizine (ZYRTEC) 10 MG tablet Take 10 mg by mouth daily.     diltiazem (CARDIZEM) 30 MG tablet Take 30 mg by mouth as needed.      docusate sodium (COLACE) 100 MG capsule Take 100 mg by mouth 2 (two) times daily.     HOMEOPATHIC PRODUCTS PO Take 1 tablet by mouth daily. NAD Supplement     KLOR-CON M20 20 MEQ tablet TAKE 1 TABLET BY MOUTH EVERY DAY 90 tablet 1   Melatonin 10 MG CAPS Take 1 capsule by mouth daily.     metFORMIN (GLUCOPHAGE-XR) 500 MG 24 hr tablet TAKE 1 TABLET BY MOUTH EVERY DAY WITH BREAKFAST 90 tablet 1   mometasone (ELOCON) 0.1 % cream Apply once dail y to ear canal as needed for itching 15 g 1   Multiple Vitamin (MULTIVITAMIN) tablet Take 1 tablet by mouth daily.     Multiple Vitamins-Minerals (HAIR SKIN AND NAILS FORMULA PO) Take 3 tablets by mouth daily.     NON FORMULARY  Magnesium gel Cap takes one at bedtimes     Probiotic Product (TRUNATURE DIGESTIVE PROBIOTIC) CAPS Take by mouth.     rosuvastatin (CRESTOR) 10 MG tablet TAKE 1 TABLET BY MOUTH EVERY DAY 90 tablet 3   Semaglutide-Weight Management (WEGOVY) 2.4 MG/0.75ML SOAJ INJECT 2.4 MG INTO THE SKIN ONCE A WEEK FOR 28 DAYS. 3 mL 2   spironolactone (ALDACTONE) 25 MG tablet TAKE 1 TABLET (25 MG TOTAL) BY MOUTH DAILY. 90 tablet 1   buPROPion (WELLBUTRIN SR) 200 MG 12 hr tablet TAKE 1 TABLET BY MOUTH EVERY DAY 90 tablet 1   metoprolol succinate (TOPROL-XL) 25 MG 24 hr tablet Take 1 tablet (25 mg total) by mouth daily. 90 tablet 1   ibuprofen (ADVIL) 600 MG tablet Take 1 tablet (600 mg total) by mouth every 8 (eight) hours as needed for headache. (Patient not taking: Reported on 05/23/2023) 20 tablet 0    ondansetron (ZOFRAN-ODT) 4 MG disintegrating tablet Take 1 tablet (4 mg total) by mouth every 8 (eight) hours as needed for nausea or vomiting. (Patient not taking: Reported on 05/23/2023) 20 tablet 0   predniSONE (DELTASONE) 10 MG tablet 6 tablets on Day 1 , then reduce by 1 tablet daily until gone (Patient not taking: Reported on 05/23/2023) 21 tablet 0   No facility-administered medications prior to visit.    Review of Systems;  Patient denies headache, fevers, malaise, unintentional weight loss, skin rash, eye pain, sinus congestion and sinus pain, sore throat, dysphagia,  hemoptysis , cough, dyspnea, wheezing, chest pain, palpitations, orthopnea, edema, abdominal pain, nausea, melena, diarrhea, constipation, flank pain, dysuria, hematuria, urinary  Frequency, nocturia, numbness, tingling, seizures,  Focal weakness, Loss of consciousness,  Tremor, insomnia, depression, anxiety, and suicidal ideation.      Objective:  BP 128/82   Pulse 90   Ht 5\' 7"  (1.702 m)   Wt 195 lb (88.5 kg)   SpO2 98%   BMI 30.54 kg/m   BP Readings from Last 3 Encounters:  05/23/23 128/82  04/12/23 134/78  01/17/23 130/72    Wt Readings from Last 3 Encounters:  05/23/23 195 lb (88.5 kg)  04/12/23 192 lb (87.1 kg)  01/17/23 192 lb 9.6 oz (87.4 kg)    Physical Exam Vitals reviewed.  Constitutional:      General: She is not in acute distress.    Appearance: Normal appearance. She is normal weight. She is not ill-appearing, toxic-appearing or diaphoretic.  HENT:     Head: Normocephalic.  Eyes:     General: No scleral icterus.       Right eye: No discharge.        Left eye: No discharge.     Conjunctiva/sclera: Conjunctivae normal.  Cardiovascular:     Rate and Rhythm: Normal rate and regular rhythm.     Heart sounds: Normal heart sounds.  Pulmonary:     Effort: Pulmonary effort is normal. No respiratory distress.     Breath sounds: Normal breath sounds.  Musculoskeletal:        General: Normal  range of motion.  Skin:    General: Skin is warm and dry.  Neurological:     General: No focal deficit present.     Mental Status: She is alert and oriented to person, place, and time. Mental status is at baseline.  Psychiatric:        Mood and Affect: Mood normal.        Behavior: Behavior normal.        Thought  Content: Thought content normal.        Judgment: Judgment normal.    Lab Results  Component Value Date   HGBA1C 5.6 01/17/2023   HGBA1C 5.6 07/08/2022   HGBA1C 6.1 01/06/2022    Lab Results  Component Value Date   CREATININE 0.75 01/17/2023   CREATININE 0.72 07/08/2022   CREATININE 0.76 06/15/2022    Lab Results  Component Value Date   WBC 6.3 06/15/2022   HGB 13.8 06/15/2022   HCT 39.7 06/15/2022   PLT 197 06/15/2022   GLUCOSE 87 01/17/2023   CHOL 135 01/17/2023   TRIG 138.0 01/17/2023   HDL 69.80 01/17/2023   LDLDIRECT 54.0 01/17/2023   LDLCALC 38 01/17/2023   ALT 22 01/17/2023   AST 25 01/17/2023   NA 136 01/17/2023   K 4.1 01/17/2023   CL 102 01/17/2023   CREATININE 0.75 01/17/2023   BUN 16 01/17/2023   CO2 25 01/17/2023   TSH 1.80 01/06/2022   INR 1.0 11/24/2017   HGBA1C 5.6 01/17/2023   MICROALBUR <0.7 01/17/2023      Assessment & Plan:  .Cellulitis of forearm, left Assessment & Plan: Recurrent ,  secondary to insect bites and lymphedema.  Doxycycline, prednisone and steroid cream prescribed for next episode    Current mild episode of major depressive disorder without prior episode [facility]) Assessment & Plan: Symptoms have improved with continued use of wellbutrin .  No changes today    Thoracic aortic atherosclerosis [facility]) Assessment & Plan: Reviewed findings of prior CT scan today..  Patient is tolerating high potency statin therapy    Paroxysmal atrial fibrillation (HCC) Assessment & Plan: She is in sinus rhythm today;  continue DOAC   Morbid obesity (HCC) Assessment & Plan: I have congratulated her in reduction of   BMI  and encouraged  Continued weight loss with goal of 10% of body weigh over the next 6 months using a low glycemic index diet and regular exercise a minimum of 5 days per week.     Concussion without loss of consciousness, sequela [facility]) Assessment & Plan: She has been referred to Dr Sherryll Burger for evaluation secondary to recurrent memory lapses since her concussion in March 2024 /  workup is in progress     Other orders -     buPROPion HCl ER (SR); Take 1 tablet (200 mg total) by mouth daily.  Dispense: 90 tablet; Refill: 1 -     Doxycycline Hyclate; Take 1 tablet (100 mg total) by mouth 2 (two) times daily.  Dispense: 14 tablet; Refill: 0 -     predniSONE; 6 tablets on Day 1 , then reduce by 1 tablet daily until gone  Dispense: 21 tablet; Refill: 0 -     Triamcinolone Acetonide; Apply 1 Application topically 2 (two) times daily.  Dispense: 30 g; Refill: 0 -     Metoprolol Succinate ER; Take 1 tablet (25 mg total) by mouth daily.  Dispense: 90 tablet; Refill: 1     I spent 34 minutes on the day of this face to face encounter reviewing patient's  most recent visit with cardiology,  and neurology,  prior relevant surgical and non surgical procedures, recent  labs and imaging studies, counseling on weight management,  reviewing the assessment and plan with patient, and post visit ordering and reviewing of  diagnostics and therapeutics with patient  .   Follow-up: Return in about 6 months (around 11/23/2023).   Sherlene Shams, MD

## 2023-05-23 NOTE — Assessment & Plan Note (Signed)
 Reviewed findings of prior CT scan today..  Patient is tolerating high potency statin therapy

## 2023-05-23 NOTE — Assessment & Plan Note (Signed)
 I have congratulated her in reduction of   BMI and encouraged  Continued weight loss with goal of 10% of body weigh over the next 6 months using a low glycemic index diet and regular exercise a minimum of 5 days per week.

## 2023-05-23 NOTE — Assessment & Plan Note (Signed)
 Recurrent ,  secondary to insect bites and lymphedema.  Doxycycline, prednisone and steroid cream prescribed for next episode

## 2023-05-25 ENCOUNTER — Other Ambulatory Visit: Payer: Self-pay | Admitting: Neurology

## 2023-05-25 DIAGNOSIS — G3184 Mild cognitive impairment, so stated: Secondary | ICD-10-CM

## 2023-06-01 ENCOUNTER — Ambulatory Visit
Admission: RE | Admit: 2023-06-01 | Discharge: 2023-06-01 | Disposition: A | Discharge status: Home / Self Care | Source: Ambulatory Visit | Attending: Neurology | Admitting: Neurology

## 2023-06-01 DIAGNOSIS — I6782 Cerebral ischemia: Secondary | ICD-10-CM | POA: Diagnosis not present

## 2023-06-01 DIAGNOSIS — R479 Unspecified speech disturbances: Secondary | ICD-10-CM | POA: Diagnosis not present

## 2023-06-01 DIAGNOSIS — G3184 Mild cognitive impairment, so stated: Secondary | ICD-10-CM | POA: Diagnosis not present

## 2023-06-03 DIAGNOSIS — I83813 Varicose veins of bilateral lower extremities with pain: Secondary | ICD-10-CM | POA: Diagnosis not present

## 2023-06-20 ENCOUNTER — Encounter: Payer: Self-pay | Admitting: Internal Medicine

## 2023-06-20 DIAGNOSIS — Z872 Personal history of diseases of the skin and subcutaneous tissue: Secondary | ICD-10-CM | POA: Diagnosis not present

## 2023-06-20 DIAGNOSIS — L821 Other seborrheic keratosis: Secondary | ICD-10-CM | POA: Diagnosis not present

## 2023-06-20 DIAGNOSIS — Z85820 Personal history of malignant melanoma of skin: Secondary | ICD-10-CM | POA: Diagnosis not present

## 2023-06-20 DIAGNOSIS — Z85828 Personal history of other malignant neoplasm of skin: Secondary | ICD-10-CM | POA: Diagnosis not present

## 2023-06-20 DIAGNOSIS — I781 Nevus, non-neoplastic: Secondary | ICD-10-CM | POA: Diagnosis not present

## 2023-06-20 DIAGNOSIS — Z859 Personal history of malignant neoplasm, unspecified: Secondary | ICD-10-CM | POA: Diagnosis not present

## 2023-06-20 DIAGNOSIS — Z86018 Personal history of other benign neoplasm: Secondary | ICD-10-CM | POA: Diagnosis not present

## 2023-06-20 DIAGNOSIS — L570 Actinic keratosis: Secondary | ICD-10-CM | POA: Diagnosis not present

## 2023-06-20 DIAGNOSIS — L578 Other skin changes due to chronic exposure to nonionizing radiation: Secondary | ICD-10-CM | POA: Diagnosis not present

## 2023-07-11 ENCOUNTER — Ambulatory Visit: Admitting: Internal Medicine

## 2023-07-11 ENCOUNTER — Encounter: Payer: Self-pay | Admitting: Internal Medicine

## 2023-07-11 ENCOUNTER — Ambulatory Visit: Payer: Self-pay

## 2023-07-11 VITALS — BP 118/80 | HR 99 | Temp 98.2°F | Ht 67.0 in | Wt 196.0 lb

## 2023-07-11 DIAGNOSIS — I10 Essential (primary) hypertension: Secondary | ICD-10-CM

## 2023-07-11 DIAGNOSIS — J0100 Acute maxillary sinusitis, unspecified: Secondary | ICD-10-CM

## 2023-07-11 MED ORDER — DOXYCYCLINE HYCLATE 100 MG PO TABS
100.0000 mg | ORAL_TABLET | Freq: Two times a day (BID) | ORAL | 0 refills | Status: DC
Start: 2023-07-11 — End: 2023-11-29

## 2023-07-11 NOTE — Patient Instructions (Addendum)
-   It was a pleasure seeing you today -I do think that your symptoms are secondary to an acute sinusitis.  This may be due to a virus or bacteria.  However given that your symptoms have been going on for a couple weeks and not improving we will treat you with an antibiotic.   - I have ordered doxycycline  to be taken twice a day for 7 days to treat your sinusitis -Please continue conservative measures including warm showers with steam inhalation, Flonase, Zyrtec as well as saline nasal rinses -Please contact us  if any questions or concerns or if your symptoms worsen

## 2023-07-11 NOTE — Progress Notes (Signed)
 Acute Office Visit  Subjective:     Patient ID: Heidi Walsh, female    DOB: 1946/01/17, 78 y.o.   MRN: [id number]  Chief Complaint  Patient presents with   Acute Visit    Sinus pressure, headache, ear pressure, chills, diarrhea x 2 weeks Covid & Flu home tests negative    HPI Patient is in today for acute sinusitis.  Patient states that approximately 2 weeks ago she developed sneezing, rhinorrhea which she attributed to the pollen.  She then developed bilateral ear fullness as well as sinus congestion and a cough.  Patient states that she is unable to bring up the phlegm as it is too thick.  Patient also complaining of discharge from the left ear 1 night.  No fevers or chills.  States that her husband has also been sick and had similar symptoms and was prescribed an antibiotic which has helped.  She and her husband tested negative for flu and COVID  Review of Systems  Constitutional: Negative.   HENT:  Positive for congestion, ear discharge, sinus pain and sore throat. Negative for tinnitus.        Bilateral ear fullness  Respiratory:  Positive for cough. Negative for hemoptysis, sputum production, shortness of breath and wheezing.   Cardiovascular: Negative.   Gastrointestinal: Negative.  Negative for nausea and vomiting.  Psychiatric/Behavioral: Negative.          Objective:    BP 118/80   Pulse 99   Temp 98.2 F (36.8 C)   Ht 5\' 7"  (1.702 m)   Wt 196 lb (88.9 kg)   SpO2 98%   BMI 30.70 kg/m    Physical Exam Constitutional:      Appearance: Normal appearance.  HENT:     Right Ear: Tympanic membrane, ear canal and external ear normal. There is no impacted cerumen.     Left Ear: Ear canal and external ear normal. There is no impacted cerumen.     Ears:     Comments: Mild diminished light reflex noted behind left TM    Nose:     Right Sinus: Maxillary sinus tenderness present. No frontal sinus tenderness.     Left Sinus: Maxillary sinus tenderness present.  No frontal sinus tenderness.     Mouth/Throat:     Pharynx: Oropharynx is clear. No oropharyngeal exudate or posterior oropharyngeal erythema.  Pulmonary:     Breath sounds: Normal breath sounds. No wheezing or rales.  Abdominal:     General: Bowel sounds are normal. There is no distension.     Palpations: Abdomen is soft.     Tenderness: There is no abdominal tenderness.  Musculoskeletal:     Cervical back: Neck supple.  Lymphadenopathy:     Cervical: No cervical adenopathy.  Neurological:     Mental Status: She is alert and oriented to person, place, and time.  Psychiatric:        Mood and Affect: Mood normal.     No results found for any visits on 07/11/23.      Assessment & Plan:   Problem List Items Addressed This Visit       Cardiovascular and Mediastinum   Essential hypertension   - This problem is chronic and stable -Blood pressure today was 118/80 -Continue with current medications including Toprol -XL 25 mg daily as well as spironolactone  25 mg daily -No further workup for now        Respiratory   Acute sinusitis - Primary   - Patient  developed rhinorrhea and sneezing initially followed by bilateral ear fullness, left ear discharge as well as sinus tenderness and congestion.  Patient also developed a cough but was unable to bring up any phlegm because it is too thick -No fevers or chills.  Her husband has similar symptoms and is improving with an antibiotic -On exam, patient noted to have mild maxillary sinus tenderness -I suspect patient likely has an acute sinusitis causing her symptoms.  Given that her symptoms have been ongoing for 2 weeks and has been worsening I suspect this may be bacterial in nature -Will treat the patient with doxycycline  (allergic to penicillins) to complete a 7-day course -I have also advised her to continue with conservative measures including Zyrtec, Flonase as well as warm showers with steam inhalation and saline nasal rinses -No  further workup at this time      Relevant Medications   doxycycline  (VIBRA -TABS) 100 MG tablet    Meds ordered this encounter  Medications   doxycycline  (VIBRA -TABS) 100 MG tablet    Sig: Take 1 tablet (100 mg total) by mouth 2 (two) times daily.    Dispense:  14 tablet    Refill:  0    No follow-ups on file.  Tierany Appleby, MD

## 2023-07-11 NOTE — Assessment & Plan Note (Signed)
-   Patient developed rhinorrhea and sneezing initially followed by bilateral ear fullness, left ear discharge as well as sinus tenderness and congestion.  Patient also developed a cough but was unable to bring up any phlegm because it is too thick -No fevers or chills.  Her husband has similar symptoms and is improving with an antibiotic -On exam, patient noted to have mild maxillary sinus tenderness -I suspect patient likely has an acute sinusitis causing her symptoms.  Given that her symptoms have been ongoing for 2 weeks and has been worsening I suspect this may be bacterial in nature -Will treat the patient with doxycycline  (allergic to penicillins) to complete a 7-day course -I have also advised her to continue with conservative measures including Zyrtec, Flonase as well as warm showers with steam inhalation and saline nasal rinses -No further workup at this time

## 2023-07-11 NOTE — Telephone Encounter (Signed)
  Chief Complaint: cough Symptoms: cough, sinus pressure, nasal congestion Frequency: <2 weeks Pertinent Negatives: Patient denies fever Disposition: [] ED /[] Urgent Care (no appt availability in office) / [x] Appointment(In office/virtual)/ []  [place] [facility]/ [] Home Care/ [] Refused Recommended Disposition /[] [place] [facility]/ []  Follow-up with PCP Additional Notes: states that 2 weeks ago it started with the sinus drainage and now progressed into a cough and fullness in her ears. States no fever but does have chills, also fullness in ears.  States cough is non productive but has chest congestion.   Copied from CRM [id number]. Topic: General - Other >> Jul 11, 2023  8:45 AM Emylou G wrote: Reason for CRM: patient called.. worsening chills, cough, sinus, fluid in ears Number on file good. Reason for Disposition  [1] MILD difficulty breathing (e.g., minimal/no SOB at rest, SOB with walking, pulse <100) AND [2] still present when not coughing  Answer Assessment - Initial Assessment Questions 1. ONSET: "When did the cough begin?"      2 weeks  2. SEVERITY: "How bad is the cough today?"      Mod-sever 3. SPUTUM: "Describe the color of your sputum" (none, dry cough; clear, white, yellow, green)     none 4. HEMOPTYSIS: "Are you coughing up any blood?" If so ask: "How much?" (flecks, streaks, tablespoons, etc.)     no 5. DIFFICULTY BREATHING: "Are you having difficulty breathing?" If Yes, ask: "How bad is it?" (e.g., mild, moderate, severe)    - MILD: No SOB at rest, mild SOB with walking, speaks normally in sentences, can lie down, no retractions, pulse < 100.    - MODERATE: SOB at rest, SOB with minimal exertion and prefers to sit, cannot lie down flat, speaks in phrases, mild retractions, audible wheezing, pulse 100-120.    - SEVERE: Very SOB at rest, speaks in single words, struggling to breathe, sitting hunched forward, retractions, pulse > 120      Mild-mod 6. FEVER: "Do you have  a fever?" If Yes, ask: "What is your temperature, how was it measured, and when did it start?"     no  10. OTHER SYMPTOMS: "Do you have any other symptoms?" (e.g., runny nose, wheezing, chest pain)       Nasal congestion, sinus pressure  Protocols used: Cough - Acute Non-Productive-A-AH

## 2023-07-11 NOTE — Assessment & Plan Note (Signed)
-   This problem is chronic and stable -Blood pressure today was 118/80 -Continue with current medications including Toprol -XL 25 mg daily as well as spironolactone  25 mg daily -No further workup for now

## 2023-07-15 ENCOUNTER — Other Ambulatory Visit: Payer: Self-pay | Admitting: Internal Medicine

## 2023-08-12 DIAGNOSIS — Z8551 Personal history of malignant neoplasm of bladder: Secondary | ICD-10-CM | POA: Diagnosis not present

## 2023-08-12 DIAGNOSIS — Z85828 Personal history of other malignant neoplasm of skin: Secondary | ICD-10-CM | POA: Diagnosis not present

## 2023-08-12 DIAGNOSIS — Z7901 Long term (current) use of anticoagulants: Secondary | ICD-10-CM | POA: Diagnosis not present

## 2023-08-12 DIAGNOSIS — I491 Atrial premature depolarization: Secondary | ICD-10-CM | POA: Diagnosis not present

## 2023-08-12 DIAGNOSIS — Z853 Personal history of malignant neoplasm of breast: Secondary | ICD-10-CM | POA: Diagnosis not present

## 2023-08-12 DIAGNOSIS — I10 Essential (primary) hypertension: Secondary | ICD-10-CM | POA: Diagnosis not present

## 2023-08-12 DIAGNOSIS — I480 Paroxysmal atrial fibrillation: Secondary | ICD-10-CM | POA: Diagnosis not present

## 2023-08-12 DIAGNOSIS — R002 Palpitations: Secondary | ICD-10-CM | POA: Diagnosis not present

## 2023-08-12 DIAGNOSIS — Z79899 Other long term (current) drug therapy: Secondary | ICD-10-CM | POA: Diagnosis not present

## 2023-08-12 DIAGNOSIS — R350 Frequency of micturition: Secondary | ICD-10-CM | POA: Diagnosis not present

## 2023-08-14 DIAGNOSIS — I480 Paroxysmal atrial fibrillation: Secondary | ICD-10-CM | POA: Diagnosis not present

## 2023-08-22 DIAGNOSIS — Z1331 Encounter for screening for depression: Secondary | ICD-10-CM | POA: Diagnosis not present

## 2023-08-22 DIAGNOSIS — G939 Disorder of brain, unspecified: Secondary | ICD-10-CM | POA: Diagnosis not present

## 2023-08-22 DIAGNOSIS — I6381 Other cerebral infarction due to occlusion or stenosis of small artery: Secondary | ICD-10-CM | POA: Diagnosis not present

## 2023-08-29 ENCOUNTER — Ambulatory Visit
Admission: EM | Admit: 2023-08-29 | Discharge: 2023-08-29 | Disposition: A | Discharge status: Home / Self Care | Attending: Family Medicine | Admitting: Family Medicine

## 2023-08-29 DIAGNOSIS — H66012 Acute suppurative otitis media with spontaneous rupture of ear drum, left ear: Secondary | ICD-10-CM

## 2023-08-29 DIAGNOSIS — H9202 Otalgia, left ear: Secondary | ICD-10-CM | POA: Diagnosis not present

## 2023-08-29 MED ORDER — PREDNISONE 10 MG (21) PO TBPK: ORAL_TABLET | Freq: Every day | ORAL | 0 refills | Status: DC

## 2023-08-29 MED ORDER — AZITHROMYCIN 250 MG PO TABS: ORAL_TABLET | ORAL | 0 refills | Status: DC

## 2023-08-29 NOTE — ED Triage Notes (Signed)
 Left ear pain x 1 week. Patient states that she vomited last night. Patient states that the ear itches hurts and drains.

## 2023-08-29 NOTE — Discharge Instructions (Addendum)
 Take 2 tablets on day 1 then 1 tablet daily

## 2023-08-29 NOTE — ED Provider Notes (Signed)
 [facility]    CSN: [id number] Arrival date & time: 08/29/23  1040      History   Chief Complaint Chief Complaint  Patient presents with   Otalgia    HPI Heidi Walsh is a 78 y.o. female.   HPI   Heidi Walsh presents for left ear pain with itching and drainage for the past week.  She feels the drainage when she sleeps on her left side. She has clear bloody fluid coming out of her ear. She used a Q-tip to check the fluid.  She even vomited last night.  No fever, rhinorrhea, nasal congestion and cough. Slight headache. Her husband has not been vomiting.    Past Medical History:  Diagnosis Date   Actinic keratosis    managed by Shasta Molly   Atrial fibrillation [facility]) 2010   Followed by Dr. India Jude   Bladder cancer [facility])    Bladder cancer (HCC) 11/18/2016    1 cm papillar tumor covering   left UO Resected at [facility] Sept 6 by Tully Sharyl Ill and ureteral stent placed.  Stent removed sept 20.  Repeat cystoscopy 3 months [facility]     breast cancer 2010   s/p mastectomy   breast cancer    s/p mastectomy    Breast cyst 2003   aspirated, fibroystics breasts with stable calcifications on left breast   Hypertension    Leukopenia    Mitral regurgitation    and prolapse   Osteopenia 1954   Stable by repeat scans   UTI (urinary tract infection) due to Enterococcus 01/19/2022   MDR>  Requiring [facility] hospitalization for IV gentamycin Jan 16 2022.  Fosfomycin prescribed for future use to prevent hospitalizations      Patient Active Problem List   Diagnosis Date Noted   Cellulitis of forearm, left 05/23/2023   Eczema of external ear, bilateral 01/17/2023   Hearing loss due to cerumen impaction, right 07/08/2022   Concussion 07/08/2022   Personal history of bladder cancer 01/20/2022   Pain and swelling of toe, left 07/06/2021   Marital conflict 08/12/2020   Abnormal cardiac function test 08/11/2020   Edema 03/05/2020   History of total hip arthroplasty 03/05/2020    Knee stiff 03/05/2020   Pain in limb 03/05/2020   Cervical cancer screening 02/13/2020   Encounter for preventive health examination 01/13/2020   Thoracic aortic atherosclerosis (HCC) 01/13/2020   Chest pain 01/16/2019   Diuretic-induced hypokalemia 01/16/2019   Bronchiectasis (HCC) 05/20/2018   Esophageal dysmotility 05/09/2018   Multiple pulmonary nodules determined by computed tomography of lung 03/01/2018   Disorder of bursae of shoulder region 02/21/2018   History of radiofrequency ablation (RFA) procedure for cardiac arrhythmia 02/21/2018   Muscle weakness 02/21/2018   Osteoarthritis of knee 02/21/2018   Osteoarthritis of right hip 02/21/2018   Primary localized osteoarthritis of pelvic region and thigh 11/26/2017   Osteopenia 11/18/2016   Constipation, chronic 09/03/2015   Current mild episode of major depressive disorder without prior episode (HCC) 08/03/2015   Lymphedema 08/03/2015   SOB (shortness of breath) 07/15/2015   Medicare annual wellness visit, subsequent 09/23/2013   Acute sinusitis 07/23/2013   Persistent cough for 3 weeks or longer 07/23/2013   Hepatic steatosis 03/25/2013   S/P bilateral mastectomy 02/27/2013   Acquired absence of both breasts and nipples 02/27/2013   Mechanical ptosis 04/21/2012   Morbid obesity (HCC) 11/17/2011   Status post total left knee replacement 11/17/2011   Sleep apnea 11/17/2011   Paroxysmal  atrial fibrillation (HCC) 11/17/2011   History of breast cancer 11/17/2011   Presence of left artificial knee joint 11/17/2011   Essential hypertension 08/12/2011   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      Home Medications    Prior to Admission medications   Medication Sig Start Date End Date Taking? Authorizing Provider  ALPRAZolam  (XANAX ) 0.25 MG tablet Take 0.5 tablets (0.125 mg total) by mouth 2 (two) times daily as needed for anxiety. 04/12/23  Yes Marylynn Verneita CROME, MD  apixaban (ELIQUIS) 2.5 MG TABS tablet Take 2.5 mg by mouth 2 (two) times daily.   Yes [provider]  azithromycin  (ZITHROMAX  Z-PAK) 250 MG tablet Take 2 tablets on day 1 then 1 tablet daily 08/29/23  Yes Lea Baine, DO  B Complex Vitamins (VITAMIN-B COMPLEX PO) Take 1 tablet by mouth daily.   Yes [provider]  buPROPion  (WELLBUTRIN  SR) 200 MG 12 hr tablet Take 1 tablet (200 mg total) by mouth daily. 05/23/23  Yes Marylynn Verneita CROME, MD  Calcium  Carbonate-Vit D-Min (CALCIUM  1200 PO) Take 1 capsule by mouth daily.   Yes [provider]  cetirizine (ZYRTEC) 10 MG tablet Take 10 mg by mouth daily.   Yes [provider]  diltiazem (CARDIZEM CD) 120 MG 24 hr capsule Take 1 capsule by mouth daily. 08/12/23 08/06/24 Yes [provider]  diltiazem (CARDIZEM) 30 MG tablet Take 30 mg by mouth as needed.  12/08/18  Yes [provider]  docusate sodium (COLACE) 100 MG capsule Take 100 mg by mouth 2 (two) times daily.   Yes [provider]  doxycycline  (VIBRA -TABS) 100 MG tablet Take 1 tablet (100 mg total) by mouth 2 (two) times daily. 07/11/23  Yes Onesimo Claude, MD  HOMEOPATHIC PRODUCTS PO Take 1 tablet by mouth daily. NAD Supplement   Yes [provider]  Melatonin 10 MG CAPS Take 1 capsule by mouth daily.   Yes [provider]  metFORMIN  (GLUCOPHAGE -XR) 500 MG 24 hr tablet TAKE 1 TABLET BY MOUTH EVERY DAY WITH BREAKFAST 05/18/23  Yes Marylynn Verneita CROME, MD  metoprolol  succinate (TOPROL -XL) 25 MG 24 hr tablet Take 1 tablet (25 mg total)  by mouth daily. 05/23/23  Yes Marylynn Verneita CROME, MD  mometasone  (ELOCON ) 0.1 % cream Apply once dail y to ear canal as needed for itching 01/17/23  Yes Tullo, Teresa L, MD  Multiple Vitamin (MULTIVITAMIN) tablet Take 1 tablet by mouth daily.   Yes [provider]  Multiple Vitamins-Minerals (HAIR SKIN AND NAILS FORMULA PO) Take 3 tablets by mouth daily.   Yes [provider]  NON FORMULARY Magnesium gel Cap takes one at bedtimes   Yes [provider]  potassium chloride  SA (KLOR-CON  M) 20 MEQ tablet TAKE 1 TABLET BY MOUTH EVERY DAY 07/15/23  Yes Tullo, Teresa L, MD  predniSONE  (STERAPRED UNI-PAK 21 TAB) 10 MG (21) TBPK  tablet Take by mouth daily. Take 6 tabs by mouth daily for 1, then 5 tabs for 1 day, then 4 tabs for 1 day, then 3 tabs for 1 day, then 2 tabs for 1 day, then 1 tab for 1 day. 08/29/23  Yes Elvera Almario, DO  Probiotic Product (TRUNATURE DIGESTIVE PROBIOTIC) CAPS Take by mouth.   Yes [provider]  rosuvastatin  (CRESTOR ) 10 MG tablet TAKE 1 TABLET BY MOUTH EVERY DAY 01/26/23  Yes Marylynn Verneita CROME, MD  Semaglutide -Weight Management (WEGOVY ) 2.4 MG/0.75ML SOAJ INJECT 2.4 MG INTO THE SKIN ONCE A WEEK FOR 28 DAYS. 07/15/23  Yes Marylynn Verneita CROME, MD  triamcinolone  ointment (KENALOG ) 0.5 % Apply 1 Application topically 2 (two) times daily. 05/23/23  Yes Marylynn Verneita CROME, MD  spironolactone  (ALDACTONE ) 25 MG tablet TAKE 1 TABLET (25 MG TOTAL) BY MOUTH DAILY. 05/18/23 05/17/24  Marylynn Verneita CROME, MD    Family History Family History  Problem Relation Age of Onset   COPD Mother    Hypertension Father    Hepatitis Sister        Hepatitis C from a dental procedure    Social History Social History   Tobacco Use   Smoking status: Never   Smokeless tobacco: Never  Vaping Use   Vaping status: Never Used  Substance Use Topics   Alcohol use: Yes    Alcohol/week: 0.0 standard drinks of alcohol    Comment: occ   Drug use: No     Allergies   Oxycodone,  Penicillins, Ancef [cefazolin], Cephalexin, and Rivaroxaban   Review of Systems Review of Systems: :negative unless otherwise stated in HPI.      Physical Exam Triage Vital Signs ED Triage Vitals  Encounter Vitals Group     BP 08/29/23 1114 123/83     Systolic BP Percentile --      Diastolic BP Percentile --      Pulse Rate 08/29/23 1114 99     Resp 08/29/23 1114 16     Temp 08/29/23 1114 99.1 F (37.3 C)     Temp Source 08/29/23 1114 Oral     SpO2 08/29/23 1114 97 %     Weight --      Height --      Head Circumference --      Peak Flow --      Pain Score 08/29/23 1112 5     Pain Loc --      Pain Education --      Exclude from Growth Chart --    No data found.  Updated Vital Signs BP 123/83 (BP Location: Right Arm)   Pulse 99   Temp 99.1 F (37.3 C) (Oral)   Resp 16   SpO2 97%   Visual Acuity Right Eye Distance:   Left Eye Distance:   Bilateral Distance:    Right Eye Near:   Left Eye Near:    Bilateral Near:     Physical Exam GEN:     alert, non-toxic appearing female in no distress    HENT:  mucus membranes moist, oropharyngeal without lesions or exudate, no nasal discharge, right TM normal, left TM erythematous and bulging, normal external auditory canals bilaterally, nontender tragus   EYES:   no scleral injection NECK:  normal ROM, no lymphadenopathy  RESP:  no increased work of breathing Skin:   warm and dry    UC Treatments / Results  Labs (all labs ordered are listed, but only abnormal results are displayed) Labs  Reviewed - No data to display  EKG   Radiology No results found.  Procedures Procedures (including critical care time)  Medications Ordered in UC Medications - No data to display  Initial Impression / Assessment and Plan / UC Course  I have reviewed the triage vital signs and the nursing notes.  Pertinent labs & imaging results that were available during my care of the patient were reviewed by me and considered in my  medical decision making (see chart for details).     Acute Otitis media Overall patient is well-appearing, well-hydrated and without respiratory distress. She is afebrile. Treat with azithromycin  and antibiotics as below. Tylenol/Motrin 's as needed for fever or discomfort.  Stressed importance of hydration.    Discussed MDM, treatment plan and plan for follow-up with patient who agrees with plan.    Final Clinical Impressions(s) / UC Diagnoses   Final diagnoses:  Otalgia of left ear  Non-recurrent acute suppurative otitis media of left ear with spontaneous rupture of tympanic membrane     Discharge Instructions      Take 2 tablets on day 1 then 1 tablet daily       ED Prescriptions     Medication Sig Dispense Auth. Provider   azithromycin  (ZITHROMAX  Z-PAK) 250 MG tablet Take 2 tablets on day 1 then 1 tablet daily 6 tablet Huel Centola, DO   predniSONE  (STERAPRED UNI-PAK 21 TAB) 10 MG (21) TBPK tablet Take by mouth daily. Take 6 tabs by mouth daily for 1, then 5 tabs for 1 day, then 4 tabs for 1 day, then 3 tabs for 1 day, then 2 tabs for 1 day, then 1 tab for 1 day. 21 tablet Denetra Formoso, DO      PDMP not reviewed this encounter.   Kriste Berth, DO 09/10/23 2324

## 2023-09-02 DIAGNOSIS — I83812 Varicose veins of left lower extremities with pain: Secondary | ICD-10-CM | POA: Diagnosis not present

## 2023-09-02 DIAGNOSIS — I83813 Varicose veins of bilateral lower extremities with pain: Secondary | ICD-10-CM | POA: Diagnosis not present

## 2023-09-05 ENCOUNTER — Telehealth: Payer: Self-pay

## 2023-09-05 ENCOUNTER — Other Ambulatory Visit (HOSPITAL_COMMUNITY): Payer: Self-pay

## 2023-09-05 NOTE — Telephone Encounter (Signed)
 Pharmacy Patient Advocate Encounter   Received notification from CoverMyMeds that prior authorization for Wegovy  2.4 is required/requested.   Insurance verification completed.   The patient is insured through [employer] .   Per test claim: PA required; PA submitted to above mentioned insurance via CoverMyMeds Key/confirmation #/EOC [id number] Status is pending

## 2023-09-06 ENCOUNTER — Other Ambulatory Visit (HOSPITAL_COMMUNITY): Payer: Self-pay

## 2023-09-06 NOTE — Telephone Encounter (Signed)
 Pharmacy Patient Advocate Encounter  Received notification from [employer] that Prior Authorization for Wegovy  2.4MG /0.75ML auto-injectors  has been APPROVED from 03/23/23 to 03/21/24. Unable to obtain price due to refill too soon rejection, last fill date 09/05/23 next available fill date07/07/25   PA #/Case ID/Reference #: [id number]

## 2023-09-07 ENCOUNTER — Other Ambulatory Visit (HOSPITAL_COMMUNITY): Payer: Self-pay

## 2023-09-19 DIAGNOSIS — H6690 Otitis media, unspecified, unspecified ear: Secondary | ICD-10-CM | POA: Diagnosis not present

## 2023-09-19 DIAGNOSIS — C50919 Malignant neoplasm of unspecified site of unspecified female breast: Secondary | ICD-10-CM | POA: Diagnosis not present

## 2023-09-19 DIAGNOSIS — L508 Other urticaria: Secondary | ICD-10-CM | POA: Diagnosis not present

## 2023-09-19 DIAGNOSIS — R21 Rash and other nonspecific skin eruption: Secondary | ICD-10-CM | POA: Diagnosis not present

## 2023-09-19 DIAGNOSIS — C44599 Other specified malignant neoplasm of skin of other part of trunk: Secondary | ICD-10-CM | POA: Diagnosis not present

## 2023-09-19 DIAGNOSIS — C50912 Malignant neoplasm of unspecified site of left female breast: Secondary | ICD-10-CM | POA: Diagnosis not present

## 2023-09-21 ENCOUNTER — Other Ambulatory Visit: Payer: Self-pay | Admitting: Internal Medicine

## 2023-09-29 DIAGNOSIS — R21 Rash and other nonspecific skin eruption: Secondary | ICD-10-CM | POA: Diagnosis not present

## 2023-10-12 DIAGNOSIS — C792 Secondary malignant neoplasm of skin: Secondary | ICD-10-CM | POA: Diagnosis not present

## 2023-10-12 DIAGNOSIS — Z170 Estrogen receptor positive status [ER+]: Secondary | ICD-10-CM | POA: Diagnosis not present

## 2023-10-12 DIAGNOSIS — C50919 Malignant neoplasm of unspecified site of unspecified female breast: Secondary | ICD-10-CM | POA: Diagnosis not present

## 2023-10-12 DIAGNOSIS — Z66 Do not resuscitate: Secondary | ICD-10-CM | POA: Diagnosis not present

## 2023-10-13 DIAGNOSIS — C50912 Malignant neoplasm of unspecified site of left female breast: Secondary | ICD-10-CM | POA: Diagnosis not present

## 2023-10-13 DIAGNOSIS — Z170 Estrogen receptor positive status [ER+]: Secondary | ICD-10-CM | POA: Diagnosis not present

## 2023-10-14 ENCOUNTER — Other Ambulatory Visit: Payer: Self-pay | Admitting: Internal Medicine

## 2023-10-20 DIAGNOSIS — C50912 Malignant neoplasm of unspecified site of left female breast: Secondary | ICD-10-CM | POA: Diagnosis not present

## 2023-10-20 DIAGNOSIS — R937 Abnormal findings on diagnostic imaging of other parts of musculoskeletal system: Secondary | ICD-10-CM | POA: Diagnosis not present

## 2023-10-20 DIAGNOSIS — Z170 Estrogen receptor positive status [ER+]: Secondary | ICD-10-CM | POA: Diagnosis not present

## 2023-10-24 DIAGNOSIS — C50912 Malignant neoplasm of unspecified site of left female breast: Secondary | ICD-10-CM | POA: Diagnosis not present

## 2023-10-24 DIAGNOSIS — C7951 Secondary malignant neoplasm of bone: Secondary | ICD-10-CM | POA: Diagnosis not present

## 2023-10-24 DIAGNOSIS — C50919 Malignant neoplasm of unspecified site of unspecified female breast: Secondary | ICD-10-CM | POA: Diagnosis not present

## 2023-10-24 DIAGNOSIS — D492 Neoplasm of unspecified behavior of bone, soft tissue, and skin: Secondary | ICD-10-CM | POA: Diagnosis not present

## 2023-10-24 DIAGNOSIS — C7651 Malignant neoplasm of right lower limb: Secondary | ICD-10-CM | POA: Diagnosis not present

## 2023-10-30 ENCOUNTER — Other Ambulatory Visit: Payer: Self-pay | Admitting: Internal Medicine

## 2023-11-07 DIAGNOSIS — Z170 Estrogen receptor positive status [ER+]: Secondary | ICD-10-CM | POA: Diagnosis not present

## 2023-11-07 DIAGNOSIS — C50912 Malignant neoplasm of unspecified site of left female breast: Secondary | ICD-10-CM | POA: Diagnosis not present

## 2023-11-08 DIAGNOSIS — K224 Dyskinesia of esophagus: Secondary | ICD-10-CM | POA: Diagnosis not present

## 2023-11-08 DIAGNOSIS — I4891 Unspecified atrial fibrillation: Secondary | ICD-10-CM | POA: Diagnosis not present

## 2023-11-08 DIAGNOSIS — Z853 Personal history of malignant neoplasm of breast: Secondary | ICD-10-CM | POA: Diagnosis not present

## 2023-11-08 DIAGNOSIS — Z880 Allergy status to penicillin: Secondary | ICD-10-CM | POA: Diagnosis not present

## 2023-11-08 DIAGNOSIS — Z888 Allergy status to other drugs, medicaments and biological substances status: Secondary | ICD-10-CM | POA: Diagnosis not present

## 2023-11-08 DIAGNOSIS — Z8551 Personal history of malignant neoplasm of bladder: Secondary | ICD-10-CM | POA: Diagnosis not present

## 2023-11-08 DIAGNOSIS — J8410 Pulmonary fibrosis, unspecified: Secondary | ICD-10-CM | POA: Diagnosis not present

## 2023-11-08 DIAGNOSIS — I480 Paroxysmal atrial fibrillation: Secondary | ICD-10-CM | POA: Diagnosis not present

## 2023-11-08 DIAGNOSIS — I498 Other specified cardiac arrhythmias: Secondary | ICD-10-CM | POA: Diagnosis not present

## 2023-11-08 DIAGNOSIS — R42 Dizziness and giddiness: Secondary | ICD-10-CM | POA: Diagnosis not present

## 2023-11-08 DIAGNOSIS — Z7901 Long term (current) use of anticoagulants: Secondary | ICD-10-CM | POA: Diagnosis not present

## 2023-11-08 DIAGNOSIS — Z881 Allergy status to other antibiotic agents status: Secondary | ICD-10-CM | POA: Diagnosis not present

## 2023-11-08 DIAGNOSIS — R0600 Dyspnea, unspecified: Secondary | ICD-10-CM | POA: Diagnosis not present

## 2023-11-08 DIAGNOSIS — R0602 Shortness of breath: Secondary | ICD-10-CM | POA: Diagnosis not present

## 2023-11-08 DIAGNOSIS — I10 Essential (primary) hypertension: Secondary | ICD-10-CM | POA: Diagnosis not present

## 2023-11-08 DIAGNOSIS — Z885 Allergy status to narcotic agent status: Secondary | ICD-10-CM | POA: Diagnosis not present

## 2023-11-08 DIAGNOSIS — Z7984 Long term (current) use of oral hypoglycemic drugs: Secondary | ICD-10-CM | POA: Diagnosis not present

## 2023-11-14 DIAGNOSIS — I480 Paroxysmal atrial fibrillation: Secondary | ICD-10-CM | POA: Diagnosis not present

## 2023-11-14 DIAGNOSIS — Z8679 Personal history of other diseases of the circulatory system: Secondary | ICD-10-CM | POA: Diagnosis not present

## 2023-11-14 DIAGNOSIS — Z170 Estrogen receptor positive status [ER+]: Secondary | ICD-10-CM | POA: Diagnosis not present

## 2023-11-14 DIAGNOSIS — Z9889 Other specified postprocedural states: Secondary | ICD-10-CM | POA: Diagnosis not present

## 2023-11-14 DIAGNOSIS — C50912 Malignant neoplasm of unspecified site of left female breast: Secondary | ICD-10-CM | POA: Diagnosis not present

## 2023-11-15 DIAGNOSIS — F419 Anxiety disorder, unspecified: Secondary | ICD-10-CM | POA: Diagnosis not present

## 2023-11-15 DIAGNOSIS — R197 Diarrhea, unspecified: Secondary | ICD-10-CM | POA: Diagnosis not present

## 2023-11-15 DIAGNOSIS — I480 Paroxysmal atrial fibrillation: Secondary | ICD-10-CM | POA: Diagnosis not present

## 2023-11-15 DIAGNOSIS — C50911 Malignant neoplasm of unspecified site of right female breast: Secondary | ICD-10-CM | POA: Diagnosis not present

## 2023-11-15 DIAGNOSIS — C50912 Malignant neoplasm of unspecified site of left female breast: Secondary | ICD-10-CM | POA: Diagnosis not present

## 2023-11-15 DIAGNOSIS — R530 Neoplastic (malignant) related fatigue: Secondary | ICD-10-CM | POA: Diagnosis not present

## 2023-11-15 DIAGNOSIS — G893 Neoplasm related pain (acute) (chronic): Secondary | ICD-10-CM | POA: Diagnosis not present

## 2023-11-15 DIAGNOSIS — I10 Essential (primary) hypertension: Secondary | ICD-10-CM | POA: Diagnosis not present

## 2023-11-15 DIAGNOSIS — Z515 Encounter for palliative care: Secondary | ICD-10-CM | POA: Diagnosis not present

## 2023-11-17 DIAGNOSIS — C792 Secondary malignant neoplasm of skin: Secondary | ICD-10-CM | POA: Diagnosis not present

## 2023-11-17 DIAGNOSIS — G893 Neoplasm related pain (acute) (chronic): Secondary | ICD-10-CM | POA: Diagnosis not present

## 2023-11-17 DIAGNOSIS — C50919 Malignant neoplasm of unspecified site of unspecified female breast: Secondary | ICD-10-CM | POA: Diagnosis not present

## 2023-11-17 DIAGNOSIS — C7951 Secondary malignant neoplasm of bone: Secondary | ICD-10-CM | POA: Diagnosis not present

## 2023-11-18 DIAGNOSIS — C50919 Malignant neoplasm of unspecified site of unspecified female breast: Secondary | ICD-10-CM | POA: Diagnosis not present

## 2023-11-18 DIAGNOSIS — C7951 Secondary malignant neoplasm of bone: Secondary | ICD-10-CM | POA: Diagnosis not present

## 2023-11-24 ENCOUNTER — Ambulatory Visit: Admitting: Internal Medicine

## 2023-11-25 DIAGNOSIS — F329 Major depressive disorder, single episode, unspecified: Secondary | ICD-10-CM | POA: Diagnosis not present

## 2023-11-25 DIAGNOSIS — C679 Malignant neoplasm of bladder, unspecified: Secondary | ICD-10-CM | POA: Diagnosis not present

## 2023-11-25 DIAGNOSIS — M8580 Other specified disorders of bone density and structure, unspecified site: Secondary | ICD-10-CM | POA: Diagnosis not present

## 2023-11-25 DIAGNOSIS — I480 Paroxysmal atrial fibrillation: Secondary | ICD-10-CM | POA: Diagnosis not present

## 2023-11-25 DIAGNOSIS — C50912 Malignant neoplasm of unspecified site of left female breast: Secondary | ICD-10-CM | POA: Diagnosis not present

## 2023-11-25 DIAGNOSIS — M1611 Unilateral primary osteoarthritis, right hip: Secondary | ICD-10-CM | POA: Diagnosis not present

## 2023-11-25 DIAGNOSIS — I10 Essential (primary) hypertension: Secondary | ICD-10-CM | POA: Diagnosis not present

## 2023-11-25 DIAGNOSIS — G4733 Obstructive sleep apnea (adult) (pediatric): Secondary | ICD-10-CM | POA: Diagnosis not present

## 2023-11-25 DIAGNOSIS — C792 Secondary malignant neoplasm of skin: Secondary | ICD-10-CM | POA: Diagnosis not present

## 2023-11-28 DIAGNOSIS — Z170 Estrogen receptor positive status [ER+]: Secondary | ICD-10-CM | POA: Diagnosis not present

## 2023-11-28 DIAGNOSIS — C50912 Malignant neoplasm of unspecified site of left female breast: Secondary | ICD-10-CM | POA: Diagnosis not present

## 2023-11-28 DIAGNOSIS — C50919 Malignant neoplasm of unspecified site of unspecified female breast: Secondary | ICD-10-CM | POA: Diagnosis not present

## 2023-11-28 DIAGNOSIS — C7951 Secondary malignant neoplasm of bone: Secondary | ICD-10-CM | POA: Diagnosis not present

## 2023-11-29 ENCOUNTER — Encounter: Payer: Self-pay | Admitting: Internal Medicine

## 2023-11-29 ENCOUNTER — Ambulatory Visit: Admitting: Internal Medicine

## 2023-11-29 VITALS — BP 136/76 | HR 109 | Ht 67.0 in | Wt 196.8 lb

## 2023-11-29 DIAGNOSIS — C7981 Secondary malignant neoplasm of breast: Secondary | ICD-10-CM | POA: Diagnosis not present

## 2023-11-29 DIAGNOSIS — I480 Paroxysmal atrial fibrillation: Secondary | ICD-10-CM | POA: Diagnosis not present

## 2023-11-29 DIAGNOSIS — C7989 Secondary malignant neoplasm of other specified sites: Secondary | ICD-10-CM | POA: Diagnosis not present

## 2023-11-29 DIAGNOSIS — C792 Secondary malignant neoplasm of skin: Secondary | ICD-10-CM

## 2023-11-29 DIAGNOSIS — C7951 Secondary malignant neoplasm of bone: Secondary | ICD-10-CM | POA: Diagnosis not present

## 2023-11-29 DIAGNOSIS — K760 Fatty (change of) liver, not elsewhere classified: Secondary | ICD-10-CM

## 2023-11-29 MED ORDER — BUPROPION HCL ER (SR) 200 MG PO TB12: 200.0000 mg | ORAL_TABLET | Freq: Every day | ORAL | 1 refills | Status: AC

## 2023-11-29 MED ORDER — ROSUVASTATIN CALCIUM 10 MG PO TABS: 10.0000 mg | ORAL_TABLET | Freq: Every day | ORAL | 3 refills | Status: AC

## 2023-11-29 MED ORDER — POTASSIUM CHLORIDE CRYS ER 20 MEQ PO TBCR: 20.0000 meq | EXTENDED_RELEASE_TABLET | Freq: Every day | ORAL | 1 refills | Status: AC

## 2023-11-29 NOTE — Assessment & Plan Note (Signed)
 Diagnosed by skin biopsy, Currently off Ibrance and on exemestane due to intolerance . Planning to start Xgeva.She is following closely with oncology and palliative care.

## 2023-11-29 NOTE — Assessment & Plan Note (Addendum)
 ED visit 8/19 for AF with shortness of breath. She was treated in the ER with IV fluids and converted spontaneously to NSR. She then had another episode on 8/21 of palpitations that felt distinctly different from her usual afib episodes. Controlled with diltiazem

## 2023-11-29 NOTE — Patient Instructions (Signed)
 Consider a trial of gabapentin  (Neurontin)  or pregabalin (Lyrica) for the itching/nerve pain   Per [facility]:  Thursday: no visit with Therisa Gift.  Your labs and  injection time will be  pushed back to  12:15 and 12:45   Follow up with Dr Sybil 's  NP Annabelle Luis will be Sept 26 Friday,  2:30 pm   [email or web address]

## 2023-11-29 NOTE — Progress Notes (Signed)
 Subjective:  Patient ID: Heidi Walsh, female    DOB: 03/19/1946  Age: 78 y.o. 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 Diagnoses of Hepatic steatosis and Paroxysmal atrial fibrillation (HCC) were also pertinent to this visit.   HPI Heidi Walsh presents for  Chief Complaint  Patient presents with   Medical Management of Chronic Issues    6 month follow up      Since her last visit with me  7 months ago Aneth's breat cancer has returned after 15 years and hs metastisizd to skin followed by bone. (Right shoulder and clavicle.  She has d chronic pain, pruritus, and diarrhea secondary to cancer therap .  She has had Recent exacerbation with new cutaneous lesions on the back, described as red, inflamed, and itchy. Diarrhea was attributed to Ibrance chemotherapy and treatment was changed:  Currently off Ibrance and on exemestane. Planning to start Xgeva.She is following closely with oncology and palliative care.  She has established care with  geriatrics at [facility] who   - Supported use of hydrocodone  for pain and pruritus management, especially at night to aid sleep. - Avoid concurrent use of hydrocodone  and Xanax .     Outpatient Medications Prior to Visit  Medication Sig Dispense Refill   acetaminophen (TYLENOL) 500 MG tablet Take 500 mg by mouth at bedtime.     ALPRAZolam  (XANAX ) 0.25 MG tablet Take 0.5 tablets (0.125 mg total) by mouth 2 (two) times daily as needed for anxiety. 30 tablet 2   apixaban (ELIQUIS) 2.5 MG TABS tablet Take 2.5 mg by mouth 2 (two) times daily.     B Complex Vitamins (VITAMIN-B COMPLEX PO) Take 1 tablet by mouth daily.     Calcium  Carbonate-Vit D-Min (CALCIUM  1200 PO) Take 1 capsule by mouth daily.     capecitabine (XELODA) 500 MG tablet Take 1,500 mg by mouth. Take 3 tablets (1,500 mg total) by mouth two (2) times a day then take 7 days off. Repeat.     cetirizine  (ZYRTEC) 10 MG tablet Take 10 mg by mouth daily.     diclofenac Sodium (VOLTAREN) 1 % GEL Apply 2 g topically 4 (four) times daily.     diltiazem (CARDIZEM CD) 180 MG 24 hr capsule Take 180 mg by mouth daily.     diltiazem (CARDIZEM) 30 MG tablet Take 30 mg by mouth as needed.      docusate sodium (COLACE) 100 MG capsule Take 100 mg by mouth 2 (two) times daily.     exemestane (AROMASIN) 25 MG tablet Take 25 mg by mouth daily.     HOMEOPATHIC PRODUCTS PO Take 1 tablet by mouth daily. NAD Supplement     HYDROcodone -Acetaminophen 5-300 MG TABS Take 1 tablet by mouth.  Take 1 tablet by mouth every four (4) hours as needed for pain     Melatonin 10 MG CAPS Take 1 capsule by mouth daily.     metFORMIN  (GLUCOPHAGE -XR) 500 MG 24 hr tablet TAKE 1 TABLET BY MOUTH EVERY DAY WITH BREAKFAST 90 tablet 1   mometasone  (ELOCON ) 0.1 % cream Apply once dail y to ear canal as needed for itching 15 g 1   Multiple Vitamin (MULTIVITAMIN) tablet Take 1 tablet by mouth daily.     Multiple Vitamins-Minerals (HAIR SKIN AND NAILS FORMULA PO) Take 3 tablets by mouth daily.     NON FORMULARY Magnesium gel Cap takes one at bedtimes  Probiotic Product (TRUNATURE DIGESTIVE PROBIOTIC) CAPS Take by mouth.     triamcinolone  ointment (KENALOG ) 0.5 % Apply 1 Application topically 2 (two) times daily. 30 g 0   buPROPion  (WELLBUTRIN  SR) 200 MG 12 hr tablet Take 1 tablet (200 mg total) by mouth daily. 90 tablet 1   diltiazem (CARDIZEM CD) 120 MG 24 hr capsule Take 1 capsule by mouth daily.     potassium chloride  SA (KLOR-CON  M) 20 MEQ tablet TAKE 1 TABLET BY MOUTH EVERY DAY 90 tablet 1   rosuvastatin  (CRESTOR ) 10 MG tablet TAKE 1 TABLET BY MOUTH EVERY DAY 90 tablet 3   azithromycin  (ZITHROMAX  Z-PAK) 250 MG tablet Take 2 tablets on day 1 then 1 tablet daily 6 tablet 0   doxycycline  (VIBRA -TABS) 100 MG tablet Take 1 tablet (100 mg total) by mouth 2 (two) times daily. 14 tablet 0   metoprolol  succinate (TOPROL -XL) 25 MG 24 hr tablet  TAKE 1 TABLET (25 MG TOTAL) BY MOUTH DAILY. (Patient not taking: Reported on 11/29/2023) 90 tablet 1   predniSONE  (STERAPRED UNI-PAK 21 TAB) 10 MG (21) TBPK tablet Take by mouth daily. Take 6 tabs by mouth daily for 1, then 5 tabs for 1 day, then 4 tabs for 1 day, then 3 tabs for 1 day, then 2 tabs for 1 day, then 1 tab for 1 day. (Patient not taking: Reported on 11/29/2023) 21 tablet 0   semaglutide -weight management (WEGOVY ) 2.4 MG/0.75ML SOAJ SQ injection INJECT 2.4 MG INTO THE SKIN ONCE A WEEK FOR 28 DAYS. (Patient not taking: Reported on 11/29/2023) 3 mL 2   spironolactone  (ALDACTONE ) 25 MG tablet TAKE 1 TABLET (25 MG TOTAL) BY MOUTH DAILY. (Patient not taking: Reported on 11/29/2023) 90 tablet 1   No facility-administered medications prior to visit.    Review of Systems;  Patient denies headache, fevers, malaise, unintentional weight loss, skin rash, eye pain, sinus congestion and sinus pain, sore throat, dysphagia,  hemoptysis , cough, dyspnea, wheezing, chest pain, palpitations, orthopnea, edema, abdominal pain, nausea, melena, diarrhea, constipation, flank pain, dysuria, hematuria, urinary  Frequency, nocturia, numbness, tingling, seizures,  Focal weakness, Loss of consciousness,  Tremor, insomnia, depression, anxiety, and suicidal ideation.      Objective:  BP 136/76   Pulse (!) 109   Ht 5' 7 (1.702 m)   Wt 196 lb 12.8 oz (89.3 kg)   SpO2 97%   BMI 30.82 kg/m   BP Readings from Last 3 Encounters:  11/29/23 136/76  08/29/23 123/83  07/11/23 118/80    Wt Readings from Last 3 Encounters:  11/29/23 196 lb 12.8 oz (89.3 kg)  07/11/23 196 lb (88.9 kg)  05/23/23 195 lb (88.5 kg)    Physical Exam Vitals reviewed.  Constitutional:      General: She is not in acute distress.    Appearance: Normal appearance. She is normal weight. She is not ill-appearing, toxic-appearing or diaphoretic.  HENT:     Head: Normocephalic.  Eyes:     General: No scleral icterus.       Right eye: No  discharge.        Left eye: No discharge.     Conjunctiva/sclera: Conjunctivae normal.  Cardiovascular:     Rate and Rhythm: Normal rate and regular rhythm.     Heart sounds: Normal heart sounds.  Pulmonary:     Effort: Pulmonary effort is normal. No respiratory distress.     Breath sounds: Normal breath sounds.  Musculoskeletal:        General:  Normal range of motion.  Skin:    General: Skin is warm and dry.  Neurological:     General: No focal deficit present.     Mental Status: She is alert and oriented to person, place, and time. Mental status is at baseline.  Psychiatric:        Mood and Affect: Mood normal.        Behavior: Behavior normal.        Thought Content: Thought content normal.        Judgment: Judgment normal.     Lab Results  Component Value Date   HGBA1C 5.6 01/17/2023   HGBA1C 5.6 07/08/2022   HGBA1C 6.1 01/06/2022    Lab Results  Component Value Date   CREATININE 0.75 01/17/2023   CREATININE 0.72 07/08/2022   CREATININE 0.76 06/15/2022    Lab Results  Component Value Date   WBC 6.3 06/15/2022   HGB 13.8 06/15/2022   HCT 39.7 06/15/2022   PLT 197 06/15/2022   GLUCOSE 87 01/17/2023   CHOL 135 01/17/2023   TRIG 138.0 01/17/2023   HDL 69.80 01/17/2023   LDLDIRECT 54.0 01/17/2023   LDLCALC 38 01/17/2023   ALT 22 01/17/2023   AST 25 01/17/2023   NA 136 01/17/2023   K 4.1 01/17/2023   CL 102 01/17/2023   CREATININE 0.75 01/17/2023   BUN 16 01/17/2023   CO2 25 01/17/2023   TSH 1.80 01/06/2022   INR 1.0 11/24/2017   HGBA1C 5.6 01/17/2023    No results found.  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 Planning to start Xgeva.She is following closely with oncology and palliative care.     Hepatic steatosis -     Rosuvastatin  Calcium ; Take 1 tablet (10 mg total) by mouth daily.   Dispense: 90 tablet; Refill: 3  Paroxysmal atrial fibrillation [facility]) Assessment & Plan: ED visit 8/19 for AF with shortness of breath. She was treated in the ER with IV fluids and converted spontaneously to NSR. She then had another episode on 8/21 of palpitations that felt distinctly different from her usual afib episodes. Controlled with diltiazem    Other orders -     buPROPion  HCl ER (SR); Take 1 tablet (200 mg total) by mouth daily.  Dispense: 90 tablet; Refill: 1 -     Potassium Chloride  Crys ER; Take 1 tablet (20 mEq total) by mouth daily.  Dispense: 90 tablet; Refill: 1   I personally spent a total of 40 minutes in the care of the patient today including preparing to see the patient, getting/reviewing separately obtained history, performing a medically appropriate exam/evaluation, and documenting clinical information in the EHR.    Follow-up: No follow-ups on file.   Verneita LITTIE Kettering, MD

## 2023-12-01 DIAGNOSIS — Z170 Estrogen receptor positive status [ER+]: Secondary | ICD-10-CM | POA: Diagnosis not present

## 2023-12-01 DIAGNOSIS — Z66 Do not resuscitate: Secondary | ICD-10-CM | POA: Diagnosis not present

## 2023-12-01 DIAGNOSIS — C7951 Secondary malignant neoplasm of bone: Secondary | ICD-10-CM | POA: Diagnosis not present

## 2023-12-01 DIAGNOSIS — C50912 Malignant neoplasm of unspecified site of left female breast: Secondary | ICD-10-CM | POA: Diagnosis not present

## 2023-12-02 DIAGNOSIS — L280 Lichen simplex chronicus: Secondary | ICD-10-CM | POA: Diagnosis not present

## 2023-12-03 DIAGNOSIS — I10 Essential (primary) hypertension: Secondary | ICD-10-CM | POA: Diagnosis not present

## 2023-12-03 DIAGNOSIS — Z8551 Personal history of malignant neoplasm of bladder: Secondary | ICD-10-CM | POA: Diagnosis not present

## 2023-12-03 DIAGNOSIS — Z880 Allergy status to penicillin: Secondary | ICD-10-CM | POA: Diagnosis not present

## 2023-12-03 DIAGNOSIS — Z885 Allergy status to narcotic agent status: Secondary | ICD-10-CM | POA: Diagnosis not present

## 2023-12-03 DIAGNOSIS — Z853 Personal history of malignant neoplasm of breast: Secondary | ICD-10-CM | POA: Diagnosis not present

## 2023-12-03 DIAGNOSIS — R079 Chest pain, unspecified: Secondary | ICD-10-CM | POA: Diagnosis not present

## 2023-12-03 DIAGNOSIS — R0789 Other chest pain: Secondary | ICD-10-CM | POA: Diagnosis not present

## 2023-12-03 DIAGNOSIS — Z79811 Long term (current) use of aromatase inhibitors: Secondary | ICD-10-CM | POA: Diagnosis not present

## 2023-12-03 DIAGNOSIS — Z791 Long term (current) use of non-steroidal anti-inflammatories (NSAID): Secondary | ICD-10-CM | POA: Diagnosis not present

## 2023-12-03 DIAGNOSIS — Z7984 Long term (current) use of oral hypoglycemic drugs: Secondary | ICD-10-CM | POA: Diagnosis not present

## 2023-12-03 DIAGNOSIS — C7951 Secondary malignant neoplasm of bone: Secondary | ICD-10-CM | POA: Diagnosis not present

## 2023-12-03 DIAGNOSIS — Z79899 Other long term (current) drug therapy: Secondary | ICD-10-CM | POA: Diagnosis not present

## 2023-12-03 DIAGNOSIS — Z7901 Long term (current) use of anticoagulants: Secondary | ICD-10-CM | POA: Diagnosis not present

## 2023-12-06 DIAGNOSIS — I201 Angina pectoris with documented spasm: Secondary | ICD-10-CM | POA: Diagnosis not present

## 2023-12-06 DIAGNOSIS — I480 Paroxysmal atrial fibrillation: Secondary | ICD-10-CM | POA: Diagnosis not present

## 2023-12-06 DIAGNOSIS — L84 Corns and callosities: Secondary | ICD-10-CM | POA: Diagnosis not present

## 2023-12-06 DIAGNOSIS — C792 Secondary malignant neoplasm of skin: Secondary | ICD-10-CM | POA: Diagnosis not present

## 2023-12-06 DIAGNOSIS — R079 Chest pain, unspecified: Secondary | ICD-10-CM | POA: Diagnosis not present

## 2023-12-06 DIAGNOSIS — C50919 Malignant neoplasm of unspecified site of unspecified female breast: Secondary | ICD-10-CM | POA: Diagnosis not present

## 2023-12-06 DIAGNOSIS — G4733 Obstructive sleep apnea (adult) (pediatric): Secondary | ICD-10-CM | POA: Diagnosis not present

## 2023-12-06 DIAGNOSIS — I10 Essential (primary) hypertension: Secondary | ICD-10-CM | POA: Diagnosis not present

## 2023-12-06 DIAGNOSIS — F329 Major depressive disorder, single episode, unspecified: Secondary | ICD-10-CM | POA: Diagnosis not present

## 2023-12-07 DIAGNOSIS — C7951 Secondary malignant neoplasm of bone: Secondary | ICD-10-CM | POA: Diagnosis not present

## 2023-12-07 DIAGNOSIS — C50919 Malignant neoplasm of unspecified site of unspecified female breast: Secondary | ICD-10-CM | POA: Diagnosis not present

## 2023-12-14 ENCOUNTER — Other Ambulatory Visit: Payer: Self-pay | Admitting: Internal Medicine

## 2023-12-14 NOTE — Telephone Encounter (Signed)
 At last visit pt stated that she would be establishing with a new provider in [facility]. I called pt to see if she had established with him yet and she stated that she has met with him twice but they have not discussed her medications yet. Pt would like to know if we could refill the medication for one more 90 day supply.

## 2023-12-15 DIAGNOSIS — C50912 Malignant neoplasm of unspecified site of left female breast: Secondary | ICD-10-CM | POA: Diagnosis not present

## 2023-12-15 DIAGNOSIS — I739 Peripheral vascular disease, unspecified: Secondary | ICD-10-CM | POA: Diagnosis not present

## 2023-12-15 DIAGNOSIS — I427 Cardiomyopathy due to drug and external agent: Secondary | ICD-10-CM | POA: Diagnosis not present

## 2023-12-15 DIAGNOSIS — I480 Paroxysmal atrial fibrillation: Secondary | ICD-10-CM | POA: Diagnosis not present

## 2023-12-16 DIAGNOSIS — I480 Paroxysmal atrial fibrillation: Secondary | ICD-10-CM | POA: Diagnosis not present

## 2023-12-16 DIAGNOSIS — I2510 Atherosclerotic heart disease of native coronary artery without angina pectoris: Secondary | ICD-10-CM | POA: Diagnosis not present

## 2023-12-27 DIAGNOSIS — Z880 Allergy status to penicillin: Secondary | ICD-10-CM | POA: Diagnosis not present

## 2023-12-27 DIAGNOSIS — Z7984 Long term (current) use of oral hypoglycemic drugs: Secondary | ICD-10-CM | POA: Diagnosis not present

## 2023-12-27 DIAGNOSIS — R079 Chest pain, unspecified: Secondary | ICD-10-CM | POA: Diagnosis not present

## 2023-12-27 DIAGNOSIS — I2510 Atherosclerotic heart disease of native coronary artery without angina pectoris: Secondary | ICD-10-CM | POA: Diagnosis not present

## 2023-12-27 DIAGNOSIS — I471 Supraventricular tachycardia, unspecified: Secondary | ICD-10-CM | POA: Diagnosis not present

## 2023-12-27 DIAGNOSIS — Z7901 Long term (current) use of anticoagulants: Secondary | ICD-10-CM | POA: Diagnosis not present

## 2023-12-27 DIAGNOSIS — I739 Peripheral vascular disease, unspecified: Secondary | ICD-10-CM | POA: Diagnosis not present

## 2023-12-27 DIAGNOSIS — Z79899 Other long term (current) drug therapy: Secondary | ICD-10-CM | POA: Diagnosis not present

## 2023-12-27 DIAGNOSIS — I480 Paroxysmal atrial fibrillation: Secondary | ICD-10-CM | POA: Diagnosis not present

## 2023-12-27 DIAGNOSIS — I10 Essential (primary) hypertension: Secondary | ICD-10-CM | POA: Diagnosis not present

## 2023-12-28 DIAGNOSIS — I480 Paroxysmal atrial fibrillation: Secondary | ICD-10-CM | POA: Diagnosis not present

## 2024-01-06 DIAGNOSIS — R079 Chest pain, unspecified: Secondary | ICD-10-CM | POA: Diagnosis not present

## 2024-01-06 DIAGNOSIS — Z881 Allergy status to other antibiotic agents status: Secondary | ICD-10-CM | POA: Diagnosis not present

## 2024-01-06 DIAGNOSIS — C50512 Malignant neoplasm of lower-outer quadrant of left female breast: Secondary | ICD-10-CM | POA: Diagnosis not present

## 2024-01-06 DIAGNOSIS — I480 Paroxysmal atrial fibrillation: Secondary | ICD-10-CM | POA: Diagnosis not present

## 2024-01-06 DIAGNOSIS — C50912 Malignant neoplasm of unspecified site of left female breast: Secondary | ICD-10-CM | POA: Diagnosis not present

## 2024-01-06 DIAGNOSIS — C7951 Secondary malignant neoplasm of bone: Secondary | ICD-10-CM | POA: Diagnosis not present

## 2024-01-06 DIAGNOSIS — I2510 Atherosclerotic heart disease of native coronary artery without angina pectoris: Secondary | ICD-10-CM | POA: Diagnosis not present

## 2024-01-06 DIAGNOSIS — Z880 Allergy status to penicillin: Secondary | ICD-10-CM | POA: Diagnosis not present

## 2024-01-06 DIAGNOSIS — Z885 Allergy status to narcotic agent status: Secondary | ICD-10-CM | POA: Diagnosis not present

## 2024-01-06 DIAGNOSIS — Z170 Estrogen receptor positive status [ER+]: Secondary | ICD-10-CM | POA: Diagnosis not present

## 2024-01-10 DIAGNOSIS — G893 Neoplasm related pain (acute) (chronic): Secondary | ICD-10-CM | POA: Diagnosis not present

## 2024-01-23 DIAGNOSIS — K224 Dyskinesia of esophagus: Secondary | ICD-10-CM | POA: Diagnosis not present

## 2024-01-23 DIAGNOSIS — Z79899 Other long term (current) drug therapy: Secondary | ICD-10-CM | POA: Diagnosis not present

## 2024-01-23 DIAGNOSIS — Z66 Do not resuscitate: Secondary | ICD-10-CM | POA: Diagnosis not present

## 2024-01-23 DIAGNOSIS — Z881 Allergy status to other antibiotic agents status: Secondary | ICD-10-CM | POA: Diagnosis not present

## 2024-01-23 DIAGNOSIS — I10 Essential (primary) hypertension: Secondary | ICD-10-CM | POA: Diagnosis not present

## 2024-01-23 DIAGNOSIS — I480 Paroxysmal atrial fibrillation: Secondary | ICD-10-CM | POA: Diagnosis not present

## 2024-01-23 DIAGNOSIS — Z880 Allergy status to penicillin: Secondary | ICD-10-CM | POA: Diagnosis not present

## 2024-01-23 DIAGNOSIS — Z85828 Personal history of other malignant neoplasm of skin: Secondary | ICD-10-CM | POA: Diagnosis not present

## 2024-01-23 DIAGNOSIS — Z7984 Long term (current) use of oral hypoglycemic drugs: Secondary | ICD-10-CM | POA: Diagnosis not present

## 2024-01-23 DIAGNOSIS — Z85830 Personal history of malignant neoplasm of bone: Secondary | ICD-10-CM | POA: Diagnosis not present

## 2024-01-23 DIAGNOSIS — Z885 Allergy status to narcotic agent status: Secondary | ICD-10-CM | POA: Diagnosis not present

## 2024-01-23 DIAGNOSIS — Z7901 Long term (current) use of anticoagulants: Secondary | ICD-10-CM | POA: Diagnosis not present

## 2024-01-23 DIAGNOSIS — Z8551 Personal history of malignant neoplasm of bladder: Secondary | ICD-10-CM | POA: Diagnosis not present

## 2024-01-23 DIAGNOSIS — Z853 Personal history of malignant neoplasm of breast: Secondary | ICD-10-CM | POA: Diagnosis not present

## 2024-01-23 DIAGNOSIS — R079 Chest pain, unspecified: Secondary | ICD-10-CM | POA: Diagnosis not present

## 2024-01-24 DIAGNOSIS — R079 Chest pain, unspecified: Secondary | ICD-10-CM | POA: Diagnosis not present

## 2024-01-25 DIAGNOSIS — I480 Paroxysmal atrial fibrillation: Secondary | ICD-10-CM | POA: Diagnosis not present

## 2024-01-27 DIAGNOSIS — C50919 Malignant neoplasm of unspecified site of unspecified female breast: Secondary | ICD-10-CM | POA: Diagnosis not present

## 2024-01-27 DIAGNOSIS — C50912 Malignant neoplasm of unspecified site of left female breast: Secondary | ICD-10-CM | POA: Diagnosis not present

## 2024-01-27 DIAGNOSIS — Z170 Estrogen receptor positive status [ER+]: Secondary | ICD-10-CM | POA: Diagnosis not present

## 2024-01-27 DIAGNOSIS — C7951 Secondary malignant neoplasm of bone: Secondary | ICD-10-CM | POA: Diagnosis not present

## 2024-01-31 DIAGNOSIS — I4891 Unspecified atrial fibrillation: Secondary | ICD-10-CM | POA: Diagnosis not present

## 2024-02-02 DIAGNOSIS — I480 Paroxysmal atrial fibrillation: Secondary | ICD-10-CM | POA: Diagnosis not present

## 2024-02-02 DIAGNOSIS — I340 Nonrheumatic mitral (valve) insufficiency: Secondary | ICD-10-CM | POA: Diagnosis not present

## 2024-02-02 DIAGNOSIS — R079 Chest pain, unspecified: Secondary | ICD-10-CM | POA: Diagnosis not present

## 2024-02-02 DIAGNOSIS — I5031 Acute diastolic (congestive) heart failure: Secondary | ICD-10-CM | POA: Diagnosis not present

## 2024-02-08 DIAGNOSIS — I480 Paroxysmal atrial fibrillation: Secondary | ICD-10-CM | POA: Diagnosis not present

## 2024-02-08 DIAGNOSIS — I5031 Acute diastolic (congestive) heart failure: Secondary | ICD-10-CM | POA: Diagnosis not present

## 2024-02-08 DIAGNOSIS — C50912 Malignant neoplasm of unspecified site of left female breast: Secondary | ICD-10-CM | POA: Diagnosis not present

## 2024-02-08 DIAGNOSIS — Z170 Estrogen receptor positive status [ER+]: Secondary | ICD-10-CM | POA: Diagnosis not present

## 2024-02-15 DIAGNOSIS — Z170 Estrogen receptor positive status [ER+]: Secondary | ICD-10-CM | POA: Diagnosis not present

## 2024-02-15 DIAGNOSIS — C50912 Malignant neoplasm of unspecified site of left female breast: Secondary | ICD-10-CM | POA: Diagnosis not present

## 2024-02-15 DIAGNOSIS — C7951 Secondary malignant neoplasm of bone: Secondary | ICD-10-CM | POA: Diagnosis not present

## 2024-04-13 ENCOUNTER — Ambulatory Visit: Payer: Medicare PPO
